# Patient Record
Sex: Female | Born: 1935 | Race: White | Hispanic: No | Smoking: Never smoker
Health system: Southern US, Community
[De-identification: ages and names within clinical notes are randomized; demographics above are authoritative.]

## PROBLEM LIST (undated history)

## (undated) DIAGNOSIS — E049 Nontoxic goiter, unspecified: Secondary | ICD-10-CM

## (undated) DIAGNOSIS — J189 Pneumonia, unspecified organism: Secondary | ICD-10-CM

## (undated) DIAGNOSIS — D126 Benign neoplasm of colon, unspecified: Secondary | ICD-10-CM

## (undated) DIAGNOSIS — E039 Hypothyroidism, unspecified: Secondary | ICD-10-CM

## (undated) DIAGNOSIS — I509 Heart failure, unspecified: Secondary | ICD-10-CM

## (undated) DIAGNOSIS — R5381 Other malaise: Secondary | ICD-10-CM

## (undated) DIAGNOSIS — Z7901 Long term (current) use of anticoagulants: Secondary | ICD-10-CM

## (undated) DIAGNOSIS — R609 Edema, unspecified: Secondary | ICD-10-CM

## (undated) DIAGNOSIS — I341 Nonrheumatic mitral (valve) prolapse: Secondary | ICD-10-CM

## (undated) DIAGNOSIS — H539 Unspecified visual disturbance: Secondary | ICD-10-CM

## (undated) DIAGNOSIS — I4891 Unspecified atrial fibrillation: Secondary | ICD-10-CM

## (undated) DIAGNOSIS — H1849 Other corneal degeneration: Secondary | ICD-10-CM

## (undated) DIAGNOSIS — I340 Nonrheumatic mitral (valve) insufficiency: Secondary | ICD-10-CM

## (undated) DIAGNOSIS — I10 Essential (primary) hypertension: Secondary | ICD-10-CM

## (undated) DIAGNOSIS — R5383 Other fatigue: Secondary | ICD-10-CM

## (undated) DIAGNOSIS — I499 Cardiac arrhythmia, unspecified: Secondary | ICD-10-CM

## (undated) DIAGNOSIS — K219 Gastro-esophageal reflux disease without esophagitis: Secondary | ICD-10-CM

## (undated) DIAGNOSIS — Z8639 Personal history of other endocrine, nutritional and metabolic disease: Secondary | ICD-10-CM

## (undated) HISTORY — DX: Unspecified visual disturbance: H53.9

## (undated) HISTORY — DX: Other malaise: R53.81

## (undated) HISTORY — DX: Nonrheumatic mitral (valve) insufficiency: I34.0

## (undated) HISTORY — DX: Gastro-esophageal reflux disease without esophagitis: K21.9

## (undated) HISTORY — DX: Nonrheumatic mitral (valve) prolapse: I34.1

## (undated) HISTORY — DX: Long term (current) use of anticoagulants: Z79.01

## (undated) HISTORY — DX: Pneumonia, unspecified organism: J18.9

## (undated) HISTORY — DX: Nontoxic goiter, unspecified: E04.9

## (undated) HISTORY — DX: Heart failure, unspecified: I50.9

## (undated) HISTORY — DX: Edema, unspecified: R60.9

## (undated) HISTORY — DX: Hypothyroidism, unspecified: E03.9

## (undated) HISTORY — DX: Benign neoplasm of colon, unspecified: D12.6

## (undated) HISTORY — DX: Cardiac arrhythmia, unspecified: I49.9

## (undated) HISTORY — DX: Personal history of other endocrine, nutritional and metabolic disease: Z86.39

## (undated) HISTORY — PX: CORNEAL TRANSPLANT: SHX108

## (undated) HISTORY — DX: Other fatigue: R53.83

## (undated) HISTORY — DX: Essential (primary) hypertension: I10

## (undated) HISTORY — DX: Other corneal degeneration: H18.49

## (undated) HISTORY — PX: SMALL INTESTINE SURGERY: SHX150

## (undated) HISTORY — DX: Unspecified atrial fibrillation: I48.91

## (undated) HISTORY — PX: RECTAL POLYPECTOMY: SHX2309

---
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
## 1960-08-21 HISTORY — PX: ABDOMINAL HYSTERECTOMY: SHX81

## 1986-02-19 DIAGNOSIS — H1849 Other corneal degeneration: Secondary | ICD-10-CM

## 1986-02-19 HISTORY — DX: Other corneal degeneration: H18.49

## 2000-09-10 ENCOUNTER — Encounter: Admission: RE | Admit: 2000-09-10 | Discharge: 2000-09-10 | Payer: Self-pay | Admitting: *Deleted

## 2000-09-26 DIAGNOSIS — I499 Cardiac arrhythmia, unspecified: Secondary | ICD-10-CM

## 2000-09-26 HISTORY — DX: Cardiac arrhythmia, unspecified: I49.9

## 2002-02-11 ENCOUNTER — Inpatient Hospital Stay (HOSPITAL_COMMUNITY): Admission: AD | Admit: 2002-02-11 | Discharge: 2002-02-18 | Payer: Self-pay | Admitting: Cardiology

## 2002-02-12 ENCOUNTER — Encounter: Payer: Self-pay | Admitting: Cardiology

## 2002-02-14 ENCOUNTER — Encounter: Payer: Self-pay | Admitting: Cardiology

## 2003-04-09 DIAGNOSIS — D126 Benign neoplasm of colon, unspecified: Secondary | ICD-10-CM

## 2003-04-09 HISTORY — DX: Benign neoplasm of colon, unspecified: D12.6

## 2003-06-01 ENCOUNTER — Encounter (INDEPENDENT_AMBULATORY_CARE_PROVIDER_SITE_OTHER): Payer: Self-pay | Admitting: Specialist

## 2003-06-01 ENCOUNTER — Ambulatory Visit (HOSPITAL_COMMUNITY): Admission: RE | Admit: 2003-06-01 | Discharge: 2003-06-01 | Payer: Self-pay | Admitting: General Surgery

## 2007-07-17 ENCOUNTER — Ambulatory Visit: Payer: Self-pay | Admitting: Vascular Surgery

## 2010-03-03 ENCOUNTER — Encounter: Admission: RE | Admit: 2010-03-03 | Discharge: 2010-03-03 | Payer: Self-pay | Admitting: Cardiology

## 2010-04-14 ENCOUNTER — Ambulatory Visit (HOSPITAL_COMMUNITY): Admission: RE | Admit: 2010-04-14 | Discharge: 2010-04-14 | Payer: Self-pay | Admitting: Cardiology

## 2010-04-14 ENCOUNTER — Ambulatory Visit: Payer: Self-pay | Admitting: Cardiology

## 2010-04-14 HISTORY — PX: CARDIOVERSION: SHX1299

## 2010-04-29 ENCOUNTER — Ambulatory Visit: Payer: Self-pay | Admitting: Cardiology

## 2010-05-02 ENCOUNTER — Ambulatory Visit: Payer: Self-pay | Admitting: Cardiology

## 2010-08-09 ENCOUNTER — Ambulatory Visit: Payer: Self-pay | Admitting: Cardiology

## 2010-09-20 ENCOUNTER — Encounter: Payer: Self-pay | Admitting: Cardiology

## 2010-09-20 DIAGNOSIS — Z8639 Personal history of other endocrine, nutritional and metabolic disease: Secondary | ICD-10-CM | POA: Insufficient documentation

## 2010-09-20 DIAGNOSIS — R609 Edema, unspecified: Secondary | ICD-10-CM | POA: Insufficient documentation

## 2010-09-20 DIAGNOSIS — I341 Nonrheumatic mitral (valve) prolapse: Secondary | ICD-10-CM | POA: Insufficient documentation

## 2010-09-20 DIAGNOSIS — I340 Nonrheumatic mitral (valve) insufficiency: Secondary | ICD-10-CM | POA: Insufficient documentation

## 2010-09-20 DIAGNOSIS — E039 Hypothyroidism, unspecified: Secondary | ICD-10-CM | POA: Insufficient documentation

## 2010-09-20 DIAGNOSIS — I4891 Unspecified atrial fibrillation: Secondary | ICD-10-CM | POA: Insufficient documentation

## 2010-09-20 DIAGNOSIS — I10 Essential (primary) hypertension: Secondary | ICD-10-CM | POA: Insufficient documentation

## 2010-09-20 DIAGNOSIS — K219 Gastro-esophageal reflux disease without esophagitis: Secondary | ICD-10-CM | POA: Insufficient documentation

## 2010-09-20 DIAGNOSIS — Z7901 Long term (current) use of anticoagulants: Secondary | ICD-10-CM | POA: Insufficient documentation

## 2010-09-24 ENCOUNTER — Encounter: Payer: Self-pay | Admitting: Cardiology

## 2010-10-12 DIAGNOSIS — H539 Unspecified visual disturbance: Secondary | ICD-10-CM

## 2010-10-12 HISTORY — DX: Unspecified visual disturbance: H53.9

## 2010-11-09 ENCOUNTER — Encounter: Payer: Self-pay | Admitting: Cardiology

## 2010-11-09 ENCOUNTER — Other Ambulatory Visit: Payer: Self-pay | Admitting: Cardiology

## 2010-11-09 ENCOUNTER — Ambulatory Visit (INDEPENDENT_AMBULATORY_CARE_PROVIDER_SITE_OTHER): Payer: Medicare Other | Admitting: Cardiology

## 2010-11-09 VITALS — BP 142/78 | HR 80 | Ht 61.0 in | Wt 101.4 lb

## 2010-11-09 DIAGNOSIS — I059 Rheumatic mitral valve disease, unspecified: Secondary | ICD-10-CM

## 2010-11-09 DIAGNOSIS — Z7901 Long term (current) use of anticoagulants: Secondary | ICD-10-CM

## 2010-11-09 DIAGNOSIS — I4891 Unspecified atrial fibrillation: Secondary | ICD-10-CM

## 2010-11-09 DIAGNOSIS — I509 Heart failure, unspecified: Secondary | ICD-10-CM

## 2010-11-09 DIAGNOSIS — Z79899 Other long term (current) drug therapy: Secondary | ICD-10-CM

## 2010-11-09 DIAGNOSIS — I340 Nonrheumatic mitral (valve) insufficiency: Secondary | ICD-10-CM

## 2010-11-09 LAB — BASIC METABOLIC PANEL
CO2: 27 mEq/L (ref 19–32)
Chloride: 102 mEq/L (ref 96–112)
Creat: 0.64 mg/dL (ref 0.40–1.20)
Glucose, Bld: 101 mg/dL — ABNORMAL HIGH (ref 70–99)
Sodium: 138 mEq/L (ref 135–145)

## 2010-11-09 NOTE — Assessment & Plan Note (Signed)
Rate is well controlled. Continue Coumadin for stroke prophlaxis.

## 2010-11-09 NOTE — Assessment & Plan Note (Addendum)
Continue Lasix and ACEi at current doses. Continue sodium restriction.

## 2010-11-09 NOTE — Assessment & Plan Note (Addendum)
INR 3.36 on 11/08/10. Dose adjusted. Repeat INR in 2 weeks.

## 2010-11-09 NOTE — Progress Notes (Signed)
HPI Mrs. Rhein is seen for follow up of Chf, atrial fibrillation, and mitral insufficiency. She states she is doing well without significant dyspnea, chest pain, dizziness, or palpitations. She does note increased edema if she eats any salt. She reports complete lab work last month with some elevation noted of her LFTs.  Allergies  Allergen Reactions  . Other     PRESERVATIVES  . Penicillins   . Sulfa Drugs Cross Reactors     Current Outpatient Prescriptions on File Prior to Visit  Medication Sig Dispense Refill  . amLODipine (NORVASC) 5 MG tablet Take 5 mg by mouth daily.        . benazepril (LOTENSIN) 40 MG tablet Take 40 mg by mouth 2 (two) times daily.        Marland Kitchen bismuth subsalicylate (PEPTO BISMOL) 262 MG/15ML suspension Take 15 mLs by mouth as needed.        . Calcium-Magnesium-Vitamin D (CALCIUM MAGNESIUM PO) Take 200 mg by mouth 3 (three) times daily.       . CYCLOSPORINE OP Apply 2 % to eye 3 (three) times daily.        Marland Kitchen estrogens, conjugated, (PREMARIN) 0.3 MG tablet Take 0.3 mg by mouth daily. Take daily for 21 days then do not take for 7 days.       . furosemide (LASIX) 40 MG tablet Take 40 mg by mouth daily.        Marland Kitchen levothyroxine (SYNTHROID, LEVOTHROID) 25 MCG tablet Take 25 mcg by mouth daily.        . potassium chloride SA (K-DUR,KLOR-CON) 20 MEQ tablet Take 20 mEq by mouth daily.        . PrednisoLONE Acetate (PRED FORTE OP) Apply 0.25 % to eye 3 (three) times daily.        . timolol (TIMOPTIC) 0.5 % ophthalmic solution 1 drop 2 (two) times daily.        Marland Kitchen warfarin (COUMADIN) 5 MG tablet Take 5 mg by mouth as directed.        . Cholecalciferol (VITAMIN D) 2000 UNIT tablet Take 2,000 Units by mouth daily.          Past Medical History  Diagnosis Date  . HTN (hypertension)   . MVP (mitral valve prolapse)   . Mitral insufficiency     SEVERE  . Chronic anticoagulation   . Edema   . Atrial fibrillation   . GERD (gastroesophageal reflux disease)   . Personal history of  goiter   . Hypothyroidism   . CHF (congestive heart failure)     Past Surgical History  Procedure Date  . Cardioversion   . Appendectomy   . Small intestine surgery     SBO 2000  . Abdominal hysterectomy   . Corneal transplant     MULTIPLE  . Rectal polypectomy     Family History  Problem Relation Age of Onset  . Stroke Sister   . Hip fracture Sister     History   Social History  . Marital Status: Widowed    Spouse Name: N/A    Number of Children: 1  . Years of Education: N/A   Occupational History  . 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 Smoking status: Never Smoker   . Smokeless tobacco: Not on file  . Alcohol Use: No  . Drug Use: No  . Sexually Active: Not on file   Other Topics Concern  . Not on file  Social History Narrative  . No narrative on file    ROS The patient denies any heat or cold intolerance.  No weight gain or weight loss.  The patient denies headaches or blurry vision.  There is no cough or sputum production.  The patient denies dizziness.  There is no hematuria or hematochezia.  The patient denies any muscle aches or arthritis.  The patient denies any rash.  The patient denies frequent falling or instability.  There is no history of depression or anxiety.  All other systems were reviewed and are negative.  PHYSICAL EXAM BP 142/78  Pulse 80  Ht 5\' 1"  (1.549 m)  Wt 101 lb 6.4 oz (45.995 kg)  BMI 19.16 kg/m2 The patient is alert and oriented x 3.  The mood and affect are normal.  The skin is warm and dry.  Color is normal.  The HEENT exam reveals that the sclera are nonicteric. Vision is poor especially in the left eye.   The mucous membranes are moist.  The carotids are 2+ without bruits. There is a radiated murmur from the heart.  There is no thyromegaly.  There is no JVD.  The lungs are clear.  The chest wall is non tender.  The heart exam reveals a regular rate with a normal S1 and S2.  There  is a loud 4/6  systolic murmur at the apex radiating across the precordium and into the back. The PMI is not displaced.   Abdominal exam reveals good bowel sounds.  There is no guarding or rebound.  There is no hepatosplenomegaly or tenderness.  There are no masses.  Exam of the legs reveal no clubbing or cyanosis.  There is 1+ edema.The distal pulses are intact.  There is mild stasis dermatitis.Cranial nerves II - XII are intact.  Motor and sensory functions are intact.  She walks with a cane.  ASSESSMENT AND PLAN

## 2010-11-09 NOTE — Assessment & Plan Note (Signed)
Contiunue medical Rx with diruetics and afterload reduction. Not a surgical candidate.

## 2010-11-11 ENCOUNTER — Telehealth: Payer: Self-pay | Admitting: *Deleted

## 2010-11-14 NOTE — Telephone Encounter (Signed)
Notified of lab results. 

## 2010-11-17 ENCOUNTER — Other Ambulatory Visit: Payer: Self-pay | Admitting: *Deleted

## 2010-11-17 DIAGNOSIS — I4819 Other persistent atrial fibrillation: Secondary | ICD-10-CM

## 2010-11-17 MED ORDER — WARFARIN SODIUM 5 MG PO TABS: ORAL_TABLET | ORAL | Status: DC

## 2010-11-17 NOTE — Telephone Encounter (Signed)
escribe medication per fax request  

## 2010-11-22 ENCOUNTER — Ambulatory Visit: Payer: Self-pay | Admitting: *Deleted

## 2010-12-07 ENCOUNTER — Ambulatory Visit (INDEPENDENT_AMBULATORY_CARE_PROVIDER_SITE_OTHER): Payer: Medicare Other | Admitting: *Deleted

## 2010-12-07 DIAGNOSIS — I4891 Unspecified atrial fibrillation: Secondary | ICD-10-CM

## 2010-12-20 ENCOUNTER — Ambulatory Visit: Payer: Self-pay | Admitting: *Deleted

## 2010-12-20 LAB — PROTIME-INR: INR: 1.3 — AB (ref 0.9–1.1)

## 2010-12-21 ENCOUNTER — Encounter: Payer: Self-pay | Admitting: Cardiology

## 2010-12-21 ENCOUNTER — Other Ambulatory Visit: Payer: Self-pay | Admitting: *Deleted

## 2010-12-21 ENCOUNTER — Other Ambulatory Visit: Payer: Self-pay | Admitting: Cardiology

## 2010-12-21 MED ORDER — FUROSEMIDE 20 MG PO TABS: 20.0000 mg | ORAL_TABLET | Freq: Every day | ORAL | Status: DC

## 2010-12-21 MED ORDER — FUROSEMIDE 40 MG PO TABS: 40.0000 mg | ORAL_TABLET | Freq: Every day | ORAL | Status: DC

## 2010-12-21 NOTE — Telephone Encounter (Signed)
Had a question about Lasix prescription. She takes 20mg  but the pharmacy told her that the dosage was for 40mg  (instructed to cut in half and take one half in the morning and the other half in the afternoon). Please call either Ms. Vanderlinde or the pharmacy. I have pulled the chart.

## 2010-12-21 NOTE — Telephone Encounter (Signed)
escribe medication per fax request  

## 2010-12-21 NOTE — Telephone Encounter (Signed)
Lasix refilled.

## 2011-01-03 ENCOUNTER — Ambulatory Visit (INDEPENDENT_AMBULATORY_CARE_PROVIDER_SITE_OTHER): Payer: Medicare Other | Admitting: Cardiovascular Disease

## 2011-01-03 ENCOUNTER — Encounter: Payer: Self-pay | Admitting: Cardiovascular Disease

## 2011-01-03 ENCOUNTER — Encounter: Payer: Self-pay | Admitting: Cardiology

## 2011-01-03 DIAGNOSIS — I4891 Unspecified atrial fibrillation: Secondary | ICD-10-CM

## 2011-01-03 NOTE — Procedures (Signed)
RENAL ARTERY DUPLEX EVALUATION   INDICATION:  Sudden onset of hypertension.   HISTORY:  Diabetes:  No.  Cardiac:  Mitral valve prolapse, congestive heart failure.  Hypertension:  Sudden onset of hypertension last week, dizziness,  blurred vision.  Smoking:  No.   RENAL ARTERY DUPLEX FINDINGS:  Aorta-Proximal:  65 cm/s  Aorta-Mid:  80 cm/s  Aorta-Distal:  78 cm/s  Celiac Artery Origin:  391/61 cm/s  SMA Origin:  226 cm/s                                    RIGHT               LEFT  Renal Artery Origin:             125/19 cm/s         150/25 cm/s  Renal Artery Proximal:           170/27 cm/s         135/20 cm/s  Renal Artery Mid:                145/22 cm/s         163/20 cm/s  Renal Artery Distal:             87/11 cm/s          169/29 cm/s  Hilar Acceleration Time (AT):    0.05/sec            0.04/sec  Renal-Aortic Ratio (RAR):        2.13                2.12  Kidney Size:                     10.0 cm X 3.8 cm    9.5 cm X 5.2 cm  End Diastolic Ratio (EDR):       0.03 - 0.07         0.21 - 0.29  Resistive Index (RI):            0.88                0.85   IMPRESSION:  1. Mild celiac artery stenosis.  2. Renal to aortic ratio suggests less than 60% renal artery stenosis      bilaterally.  3. End-diastolic ratio suggests parenchymal disease in the right      kidney.  4. Kidneys are normal with respect to shape and size bilaterally.   ___________________________________________  Larina Earthly, M.D.   MC/MEDQ  D:  07/17/2007  T:  07/17/2007  Job:  [id number]

## 2011-01-06 NOTE — Discharge Summary (Signed)
[place]. [facility]  Patient:    Rose Weber, Rose Weber Visit Number: [id number] MRN: [id number]          Service Type: MED Location: 2000 2012 01 Attending Physician:  [place], Peter Manning Dictated by:   Peter M. [place], M.D. Admit Date:  02/11/2002 Discharge Date: 02/18/2002                             Discharge Summary  HISTORY OF PRESENT ILLNESS:  The patient is an 77 year old white female resident of [facility] in [place] who presented with increased symptoms of shortness of breath, swelling, abdominal bloating and weakness. She was found to be in atrial fibrillation with rapid ventricular response. She also had congestive heart failure. She has a known history of chronic mitral valve prolapse with mitral insufficiency. For details of her past medical history, social history, family history and physical examination, please see admission history and physical.  LABORATORY DATA:  An ECG showed atrial fibrillation with rapid ventricular response with a rate of 130. A chest x-ray showed cardiomegaly with small bilateral pleural effusions and right lower lobe infiltrate.  White count 5800, hemoglobin 14.4, hematocrit 43.3, platelets 242,000. Coags were normal. Sodium 140, potassium 3.9, chloride 104, CO2 26, BUN 18, creatinine 0.7, glucose 124. Albumin 3.3, total protein 5.9, AST 49, ALT 90. All other chemistries were normal. Magnesium 2.1. TSH normal at 1.151. CK was 112 with 4.8 MB, troponin I 0.02, BNP level 428.  HOSPITAL COURSE:  The patient was admitted. She was anticoagulated with IV heparin. She was subsequently started on oral anticoagulation with Coumadin. She was placed on IV Cardizem for rate control but subsequently became bradycardic so her IV Cardizem was discontinued. She was started on oral amiodarone at 400 mg b.i.d. She was treated with IV Lasix with an excellent diuresis. She had resolution of her lower extremity edema and  abdominal bloating. Her shortness of breath improved. Her oxygen saturations remained good. Pulmonary functions were obtained as baseline and demonstrated normal diffusing capacity and early small airway obstruction.  An echocardiogram was obtained which demonstrated normal left ventricular size and function. There was mild aortic valve sclerosis without stenosis with mild aortic insufficiency. The mitral valve was moderately thickened with prolapse predominantly of the posterior leaflet. There was moderate to severe mitral insufficiency with the jet directed anteriorly, wrapping around the left atrium. The left atrial size was the upper limit of normal at 3.9 cm.  The patient did well. She subsequently converted to normal sinus rhythm. She had a sinus bradycardia necessitating stopping Cardizem and her beta blocker. Her amiodarone dose was reduced to 200 mg per day and she tolerated this well with continued sinus bradycardia throughout the remainder of her hospital stay. Her Coumadin was adjusted to an INR of greater than 2 and then her heparin was discontinued. At the time of discharge her protime was 19.1 with an INR of 1.7.  Followup chest x-ray showed some persistent bibasilar atelectasis and small effusions. Her weight steadily declined to a pre discharge weight of 96 pound. Her BMET was normal at the time of discharge with potassium of 3.9, creatinine 0.7. Liver function tests returned to normal with resolution of her congestion. The patient was stable for discharge on February 18, 2002.  DISCHARGE DIAGNOSES: 1. Congestive heart failure. 2. Atrial fibrillation, resolved. 3. Chronic mitral valve prolapse and insufficiency moderate to severe. 4. Hypothyroidism. 5. Previous partial small bowel resection.  DISCHARGE MEDICATIONS: 1. Premarin 0.3 mg q.d. 2. Synthroid 0.125 mg q.d. 3. Lotensin 20 mg q.d. 4. Calcium 1500 mg with D daily. 5. She is to continue following eye drops as  previously directed: Timoptic 5%,    Prednisone Forte 0.25% and cyclosporine 2%.  New medications include: 1. Coumadin 5 mg on Tuesday, then 2.5 mg q.d. 2. Amiodarone 200 mg q.d. 3. Potassium 20 mEq q.d. 4. Lasix 20 mg q.d.  DISCHARGE INSTRUCTIONS:  She is to resume a low-fat diet.  FOLLOWUP:  We will have her follow up for a protime on Thursday, February 20, 2002, and then schedule her for a follow up office visit with Dr. Swaziland in one to two weeks.  DISCHARGE STATUS:  Improved. Dictated by:   Peter M. [place], M.D. Attending Physician:  [place], Peter Manning DD:  02/18/02 TD:  02/19/02 Job: [id number]

## 2011-01-06 NOTE — Op Note (Signed)
NAME:  Rose Weber, CONWAY                          ACCOUNT NO.:  [id number]   MEDICAL RECORD NO.:  [id number]                   PATIENT TYPE:  AMB   LOCATION:  DAY                                  FACILITY:  [facility]   PHYSICIAN:  Timothy E. Earlene Plater, M.D.              DATE OF BIRTH:  1913/08/05   DATE OF PROCEDURE:  06/01/2003  DATE OF DISCHARGE:                                 OPERATIVE REPORT   PREOPERATIVE DIAGNOSIS:  Rectal polyp.   POSTOPERATIVE DIAGNOSIS:  Rectal polyp.   PROCEDURE:  Excision of rectal polyp.   SURGEON:  Timothy E. Earlene Plater, M.D.   ANESTHESIA:  Local and standby.   INDICATIONS:  Ms. Augspurger is 36 and in stable health, but has very significant  cardiovascular disease with mitral valve prolapse, corrected atrial  fibrillation, and hypertension.  She has bloody mucus discharge constantly  with protrusion in the rectum.  A colonoscopy otherwise negative.  After  careful explantation and exam in the office, she wishes to proceed with  excision of lone rectal polyp.  She has been carefully seen and evaluated by  Dr. Matthias Hughs and Dr. [place]iland and we all agree.  Her Coumadin has been  adjusted.  Her pro time is normal today.  She feels stable in regards to her  cardiac disease.   She was identified, the permit signed, and evaluated by anesthesia.  Preoperative antibiotics given.   DESCRIPTION OF PROCEDURE:  The patient was taken to the operating room.  IV  sedation given.  She was placed in the lithotomy position.  The perianal  area was inspected, prepped, and draped.  A large prolapsing rectal polyp  with an irritated bloody surface protruded through the anus.  The area of  the posterior and anterior anal rim were injected with Marcaine with  epinephrine and Wydase.  This provided sufficient local anesthesia.  The  polyp was grasped and drawn out of the anus.  Its stalk at the base was  suture ligated with a 2-0 chromic suture and the entire polyp was removed  and  submitted in formalin.  A small anoscope was then inserted.  The stalk  was hemostatic and there were no other lesions.  She had tolerated it well.  Gelfoam gauze and dry sterile dressing applied.  She was removed to the  recovery room in good condition.   Careful written and verbal instructions were given to the patient, including  Darvocet for pain and a return visit.  She tolerated it well.  There were no  medical complications.                                               Timothy E. Earlene Plater, M.D.    TED/MEDQ  D:  06/01/2003  T:  06/01/2003  Job:  [id number]   cc:   Bernette Redbird, M.D.  [address] [place]  [place], [place] [address]  Fax: [phone]   Peter M. [place], M.D.  [address]  [place], [place] [address]  Fax: [phone]

## 2011-01-06 NOTE — H&P (Signed)
[place]. [facility]  Patient:    Rose Weber, Rose Weber Visit Number: [id number] MRN: [id number]          Service Type: MED Location: 2000 2012 01 Attending Physician:  [place], Peter Manning Dictated by:   Peter M. [place], M.D. Admit Date:  02/11/2002                           History and Physical  DATE OF BIRTH: 05/06/1913  CHIEF COMPLAINT: Rose Weber is a pleasant 77 year old white female, who presents with symptoms of increasing shortness of breath, swelling, and abdominal bloating.  HISTORY OF PRESENT ILLNESS: She has also noted increased heart racing and pounding.  She has had significant increase in fatigue.  She denies any chest pain or cough.  She states she began feeling bad about one month ago after she had a bad head cold.  However, her other symptoms have really progressed significantly over the past week.  On evaluation today she was found to be in atrial fibrillation with rapid ventricular response, and has evidence of congestive heart failure.  She is admitted for further management.  Rose Weber has a known history of mitral valve prolapse with mitral insufficiency dating back at least 20 years.  This has previously been asymptomatic.  She was last evaluated with an echocardiogram in August 2002, which showed normal left ventricular size and function, with ejection fraction of 55-65%.  She had moderate aortic sclerosis without stenosis.  There was mild aortic insufficiency and severe mitral insufficiency with moderate mitral valve prolapse.  She has been on chronic SBE prophylaxis.  The patient was seen in August 2002 and was asymptomatic at that time, and we recommended continued medical therapy.  PAST MEDICAL HISTORY:  1. Mitral valve prolapse and chronic regurgitation.  2. She has a history of a goiter and has been on chronic suppression with     Synthroid.  3. She has had previous appendectomy and hysterectomy.  4. She had a partial  small bowel resection in November 2000 for obstruction.  5. She also has a history of corneal transplant x5, being followed at [facility].  ALLERGIES:  1. PENICILLIN.  2. SULFA.  3. MYCINS.  CURRENT MEDICATIONS:  1. Premarin 3 mg q.d.  2. Synthroid 0.125 mg q.d.  3. Lotensin 20 mg q.d.  4. Aspirin 1/2 of a regular strength aspirin q.d.  5. Eye drops used daily:     a. Timoptic 5%.     b. Pred Forte 0.25%.     c. Cyclosporine 2%.  SOCIAL HISTORY: The patient is retired.  She is a widow.  She has a son who lives in [place], [place].  She is currently residing at [facility].  She previously has lived in [place], [place] and then [place] here.  She denies tobacco or alcohol use.  FAMILY HISTORY: Father died at age 84 with complications of polio.  Mother died at age 78 of old age.  Two brothers are in good health.  One sister died at age 35 of a stroke.  REVIEW OF SYSTEMS: The patient complains of heaviness in her legs and achiness.  She complains of abdominal swelling and bloating, and increased lower extremity edema.  She has had no significant orthopnea but has been short of breath with any activity.  All other Review Of Systems are negative.  PHYSICAL EXAMINATION:  GENERAL: The patient is a pleasant white female in no distress.  VITAL SIGNS: Blood pressure is 150/100, pulse is 130 and irregular.  Weight is 112 pounds, which is up by 13 pounds.  Respirations are 22.  HEENT: PERRL.  Conjunctivae clear.  Oropharynx clear.  NECK: Supple without JVD, adenopathy, or bruits.  She has mild thyroid enlargement which is nontender.  LUNGS: Basilar crackles.  CARDIAC: Irregular rate and rhythm with a harsh grade 3/6 holosystolic murmur heard best at the apex.  She has no S3 but does have a prominent apical impulse.  ABDOMEN: Soft and nontender, without masses or hepatosplenomegaly.  It  is distended with probable ascites.  EXTREMITIES: Femoral and pedal pulses are 2+ and symmetric.  She has 2+ lower extremity edema.  NEUROLOGIC: Examination nonfocal.  LABORATORY DATA: ECG shows atrial fibrillation with rapid ventricular response, rate 130.  Chest x-ray shows cardiomegaly with small bilateral pleural effusions and a right lower lobe infiltrate.  IMPRESSION:  1. New onset atrial fibrillation with rapid ventricular response.  2. Congestive heart failure, precipitated by #1.  3. Chronic mitral valve prolapse, with severe mitral insufficiency.  4. History of mild aortic stenosis.  5. Hypertension.  6. History of goiter.  PLAN:  1. The patient will be admitted to telemetry.  2. Will initiate anticoagulation with heparin and then convert her to     Coumadin.  3. Will obtain rate control with IV Cardizem.  4. Consider antiarrhythmic drug therapy.  5. Will obtain all routine laboratory data.  6. Also reschedule her for echocardiogram and pulmonary function studies.Dictated by:   Peter M. [place], M.D. Attending Physician:  [place], Peter Manning DD:  02/12/02 TD:  02/13/02 Job: [id number]

## 2011-01-13 ENCOUNTER — Other Ambulatory Visit: Payer: Self-pay | Admitting: *Deleted

## 2011-01-13 DIAGNOSIS — I10 Essential (primary) hypertension: Secondary | ICD-10-CM

## 2011-01-19 ENCOUNTER — Ambulatory Visit: Payer: Self-pay | Admitting: *Deleted

## 2011-01-19 LAB — PROTIME-INR: INR: 2.1 — AB (ref 0.9–1.1)

## 2011-02-09 ENCOUNTER — Other Ambulatory Visit: Payer: Medicare Other | Admitting: *Deleted

## 2011-02-09 ENCOUNTER — Ambulatory Visit: Payer: Medicare Other | Admitting: Cardiology

## 2011-02-16 ENCOUNTER — Ambulatory Visit (INDEPENDENT_AMBULATORY_CARE_PROVIDER_SITE_OTHER): Payer: Medicare Other | Admitting: Cardiology

## 2011-02-16 ENCOUNTER — Encounter: Payer: Self-pay | Admitting: Cardiology

## 2011-02-16 ENCOUNTER — Other Ambulatory Visit (INDEPENDENT_AMBULATORY_CARE_PROVIDER_SITE_OTHER): Payer: Medicare Other | Admitting: *Deleted

## 2011-02-16 ENCOUNTER — Ambulatory Visit (INDEPENDENT_AMBULATORY_CARE_PROVIDER_SITE_OTHER): Payer: Medicare Other | Admitting: *Deleted

## 2011-02-16 VITALS — BP 172/94 | HR 94 | Ht 61.5 in | Wt 98.0 lb

## 2011-02-16 DIAGNOSIS — I341 Nonrheumatic mitral (valve) prolapse: Secondary | ICD-10-CM

## 2011-02-16 DIAGNOSIS — I059 Rheumatic mitral valve disease, unspecified: Secondary | ICD-10-CM

## 2011-02-16 DIAGNOSIS — I10 Essential (primary) hypertension: Secondary | ICD-10-CM

## 2011-02-16 DIAGNOSIS — I4891 Unspecified atrial fibrillation: Secondary | ICD-10-CM

## 2011-02-16 DIAGNOSIS — I509 Heart failure, unspecified: Secondary | ICD-10-CM

## 2011-02-16 LAB — BASIC METABOLIC PANEL
CO2: 28 mEq/L (ref 19–32)
Calcium: 9.2 mg/dL (ref 8.4–10.5)
Sodium: 138 mEq/L (ref 135–145)

## 2011-02-16 NOTE — Patient Instructions (Signed)
Continue your current medications.  Watch your salt intake.  I will see you again in 3 months.

## 2011-02-17 NOTE — Assessment & Plan Note (Addendum)
She is euvolemic by exam. Her weight is stable. We will continue with her current medical therapy. Basic metabolic panel today is normal.

## 2011-02-17 NOTE — Assessment & Plan Note (Signed)
Chest chronic severe mitral insufficiency. She is a poor candidate for interventional treatment. We'll continue her medical therapy.

## 2011-02-17 NOTE — Progress Notes (Signed)
HPI Rose Weber is seen for follow up of Chf, atrial fibrillation, and mitral insufficiency. She states she is doing well without significant dyspnea, chest pain, dizziness, or palpitations. She does note increased edema if she eats any salt. Since her last visit she did undergo another corneal transplant in April. She has already noted a significant improvement in her vision. She reports her blood pressure readings at home have been good.  Allergies  Allergen Reactions  . Alphagan (Brimonidine Tartrate)   . Bystolic (Nebivolol Hcl)   . Diovan (Valsartan)   . Other     PRESERVATIVES  . Penicillins   . Polytrim   . Sulfa Drugs Cross Reactors   . Tekturna (Aliskiren Fumarate)   . Travatan Z     Current Outpatient Prescriptions on File Prior to Visit  Medication Sig Dispense Refill  . amLODipine (NORVASC) 5 MG tablet Take 5 mg by mouth daily.        . benazepril (LOTENSIN) 40 MG tablet Take 40 mg by mouth 2 (two) times daily.        Marland Kitchen bismuth subsalicylate (PEPTO BISMOL) 262 MG/15ML suspension Take 15 mLs by mouth as needed.        . Calcium-Magnesium-Vitamin D (CALCIUM MAGNESIUM PO) Take 200 mg by mouth 3 (three) times daily.       . Cholecalciferol (VITAMIN D) 2000 UNIT tablet Take 2,000 Units by mouth daily.        . CYCLOSPORINE OP Apply 2 % to eye 3 (three) times daily.        Marland Kitchen estrogens, conjugated, (PREMARIN) 0.3 MG tablet Take 0.3 mg by mouth daily. Take daily for 21 days then do not take for 7 days.       . furosemide (LASIX) 20 MG tablet Take 1 tablet (20 mg total) by mouth daily.  30 tablet  5  . levothyroxine (SYNTHROID, LEVOTHROID) 25 MCG tablet Take 25 mcg by mouth daily.        . potassium chloride SA (K-DUR,KLOR-CON) 20 MEQ tablet Take 20 mEq by mouth daily.        . PrednisoLONE Acetate (PRED FORTE OP) Apply 0.25 % to eye 3 (three) times daily.        . timolol (TIMOPTIC) 0.5 % ophthalmic solution 1 drop 2 (two) times daily.        Marland Kitchen warfarin (COUMADIN) 5 MG tablet Take 5  mg x 6 days and 2.5 mg x 1 day (tues);or as directed by physician  45 tablet  5    Past Medical History  Diagnosis Date  . HTN (hypertension)   . MVP (mitral valve prolapse)   . Mitral insufficiency     SEVERE  . Chronic anticoagulation   . Edema   . Atrial fibrillation   . GERD (gastroesophageal reflux disease)   . Personal history of goiter   . Hypothyroidism   . CHF (congestive heart failure)     Past Surgical History  Procedure Date  . Cardioversion 04/14/10  . Appendectomy   . Small intestine surgery     SBO 2000  . Abdominal hysterectomy   . Corneal transplant     MULTIPLE  . Rectal polypectomy     Family History  Problem Relation Age of Onset  . Stroke Sister   . Hip fracture Sister     History   Social History  . Marital Status: Widowed    Spouse Name: N/A    Number of Children: 1  . Years of  Education: N/A   Occupational History  . 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 Smoking status: Never Smoker   . Smokeless tobacco: Not on file  . Alcohol Use: No  . Drug Use: No  . Sexually Active: Not on file   Other Topics Concern  . Not on file   Social History Narrative  . No narrative on file    ROS The patient denies any heat or cold intolerance.  No weight gain or weight loss.  The patient denies headaches or blurry vision.  There is no cough or sputum production.  The patient denies dizziness.  There is no hematuria or hematochezia.  The patient denies any muscle aches or arthritis.  The patient denies any rash.  The patient denies frequent falling or instability.  There is no history of depression or anxiety.  All other systems were reviewed and are negative.  PHYSICAL EXAM BP 172/94  Pulse 94  Ht 5' 1.5" (1.562 m)  Wt 98 lb (44.453 kg)  BMI 18.22 kg/m2 The patient is alert and oriented x 3.  The mood and affect are normal.  The skin is warm and dry.  Color is normal.  The HEENT exam reveals that the sclera are  nonicteric. Vision is poor especially in the left eye.   The mucous membranes are moist.  The carotids are 2+ without bruits. There is a radiated murmur from the heart.  There is no thyromegaly.  There is no JVD.  The lungs are clear.  The chest wall is non tender.  The heart exam reveals a regular rate with a normal S1 and S2.  There  is a loud 4/6 systolic murmur at the apex radiating across the precordium and into the back. The PMI is not displaced.   Abdominal exam reveals good bowel sounds.  There is no guarding or rebound.  There is no hepatosplenomegaly or tenderness.  There are no masses.  Exam of the legs reveal no clubbing or cyanosis.  There is 1+ edema.The distal pulses are intact.  There is mild stasis dermatitis.Cranial nerves II - XII are intact.  Motor and sensory functions are intact.  She walks with a cane.  ASSESSMENT AND PLAN

## 2011-02-17 NOTE — Assessment & Plan Note (Signed)
Her rate is controlled and she is therapeutic with her anticoagulation. Continue with rate control and anticoagulation.

## 2011-02-21 ENCOUNTER — Other Ambulatory Visit: Payer: Self-pay | Admitting: Cardiology

## 2011-02-21 ENCOUNTER — Telehealth: Payer: Self-pay | Admitting: *Deleted

## 2011-02-21 MED ORDER — POTASSIUM CHLORIDE CRYS ER 20 MEQ PO TBCR: 20.0000 meq | EXTENDED_RELEASE_TABLET | Freq: Every day | ORAL | Status: DC

## 2011-02-21 NOTE — Telephone Encounter (Signed)
Med refill

## 2011-02-21 NOTE — Telephone Encounter (Signed)
Notified of lab results. Will coumadin checked end of July.

## 2011-02-21 NOTE — Telephone Encounter (Signed)
Message copied by Lorayne Bender on Tue Feb 21, 2011 11:09 AM ------      Message from: [place], PETER M      Created: Fri Feb 17, 2011 12:28 PM       Bmet is normal. Inr is therapeutic.

## 2011-03-09 ENCOUNTER — Other Ambulatory Visit: Payer: Self-pay | Admitting: *Deleted

## 2011-03-09 MED ORDER — BENAZEPRIL HCL 40 MG PO TABS: 40.0000 mg | ORAL_TABLET | Freq: Two times a day (BID) | ORAL | Status: DC

## 2011-03-09 NOTE — Telephone Encounter (Signed)
escribe medication per fax request  

## 2011-03-16 ENCOUNTER — Ambulatory Visit (INDEPENDENT_AMBULATORY_CARE_PROVIDER_SITE_OTHER): Payer: Self-pay | Admitting: *Deleted

## 2011-03-16 DIAGNOSIS — I4891 Unspecified atrial fibrillation: Secondary | ICD-10-CM

## 2011-03-17 ENCOUNTER — Encounter: Payer: Self-pay | Admitting: Cardiology

## 2011-03-29 ENCOUNTER — Other Ambulatory Visit: Payer: Self-pay | Admitting: *Deleted

## 2011-03-29 MED ORDER — WARFARIN SODIUM 5 MG PO TABS: ORAL_TABLET | ORAL | Status: DC

## 2011-04-20 ENCOUNTER — Telehealth: Payer: Self-pay | Admitting: Cardiology

## 2011-04-20 ENCOUNTER — Ambulatory Visit (INDEPENDENT_AMBULATORY_CARE_PROVIDER_SITE_OTHER): Payer: Self-pay | Admitting: Cardiovascular Disease

## 2011-04-20 DIAGNOSIS — R0989 Other specified symptoms and signs involving the circulatory and respiratory systems: Secondary | ICD-10-CM

## 2011-04-20 NOTE — Telephone Encounter (Signed)
Calling to repot an INR result.

## 2011-04-21 NOTE — Telephone Encounter (Signed)
See anticoag note from 04/20/11, INR results addressed.

## 2011-05-08 ENCOUNTER — Encounter: Payer: Self-pay | Admitting: Cardiology

## 2011-05-17 ENCOUNTER — Ambulatory Visit (INDEPENDENT_AMBULATORY_CARE_PROVIDER_SITE_OTHER): Payer: Medicare Other | Admitting: Cardiology

## 2011-05-17 ENCOUNTER — Encounter: Payer: Self-pay | Admitting: Cardiology

## 2011-05-17 ENCOUNTER — Ambulatory Visit: Payer: Self-pay | Admitting: *Deleted

## 2011-05-17 VITALS — BP 144/90 | HR 92 | Wt 101.0 lb

## 2011-05-17 DIAGNOSIS — I4891 Unspecified atrial fibrillation: Secondary | ICD-10-CM

## 2011-05-17 DIAGNOSIS — I059 Rheumatic mitral valve disease, unspecified: Secondary | ICD-10-CM

## 2011-05-17 DIAGNOSIS — E7800 Pure hypercholesterolemia, unspecified: Secondary | ICD-10-CM

## 2011-05-17 DIAGNOSIS — I509 Heart failure, unspecified: Secondary | ICD-10-CM

## 2011-05-17 DIAGNOSIS — I341 Nonrheumatic mitral (valve) prolapse: Secondary | ICD-10-CM

## 2011-05-17 LAB — POCT INR: INR: 2.6

## 2011-05-17 NOTE — Patient Instructions (Signed)
Continue your current medications.  I will see you again in 3 months with fasting lab work.

## 2011-05-18 NOTE — Assessment & Plan Note (Signed)
She has chronic mitral valve prolapse with severe mitral insufficiency. She is not an ideal candidate for surgery or for a clip device given her advanced age and comorbidities.

## 2011-05-18 NOTE — Progress Notes (Signed)
HPI Rose Weber is seen for follow up of Chf, atrial fibrillation, and mitral insufficiency. She states she is doing well without significant dyspnea, chest pain, dizziness, or palpitations. She does note increased edema if she eats any salt. She reports her blood pressure readings at home have been good. She does note that if she exercises she has some more pounding in her chest and she was to no foods okay to do an exercise program with a NU step machine.  Allergies  Allergen Reactions  . Alphagan (Brimonidine Tartrate)   . Bystolic (Nebivolol Hcl)   . Diovan (Valsartan)   . Other     PRESERVATIVES  . Penicillins   . Polytrim   . Sulfa Drugs Cross Reactors   . Tekturna (Aliskiren Fumarate)   . Travatan Z     Current Outpatient Prescriptions on File Prior to Visit  Medication Sig Dispense Refill  . amLODipine (NORVASC) 5 MG tablet Take 5 mg by mouth daily.        . benazepril (LOTENSIN) 40 MG tablet Take 1 tablet (40 mg total) by mouth 2 (two) times daily.  60 tablet  5  . bismuth subsalicylate (PEPTO BISMOL) 262 MG/15ML suspension Take 15 mLs by mouth as needed.        . Calcium-Magnesium-Vitamin D (CALCIUM MAGNESIUM PO) Take 200 mg by mouth 3 (three) times daily.       . carboxymethylcellulose (REFRESH PLUS) 0.5 % SOLN Apply 1 drop to eye 3 (three) times daily as needed.        . Cholecalciferol (VITAMIN D) 2000 UNIT tablet Take 2,000 Units by mouth daily.        . CYCLOSPORINE OP Apply 2 % to eye 3 (three) times daily.        Marland Kitchen erythromycin ophthalmic ointment Place 1 application into the left eye at bedtime. PRN      . estrogens, conjugated, (PREMARIN) 0.3 MG tablet Take 0.3 mg by mouth daily. Take daily for 21 days then do not take for 7 days.       . furosemide (LASIX) 20 MG tablet Take 1 tablet (20 mg total) by mouth daily.  30 tablet  5  . levothyroxine (SYNTHROID, LEVOTHROID) 25 MCG tablet Take 25 mcg by mouth daily.        . potassium chloride SA (K-DUR,KLOR-CON) 20 MEQ tablet  Take 1 tablet (20 mEq total) by mouth daily.  30 tablet  5  . PrednisoLONE Acetate (PRED FORTE OP) Apply 0.25 % to eye 3 (three) times daily.        . timolol (TIMOPTIC) 0.5 % ophthalmic solution 1 drop 2 (two) times daily.        Marland Kitchen warfarin (COUMADIN) 5 MG tablet Take 5 mg x 6 days and 2.5 mg x 1 day (tues);or as directed by physician  45 tablet  5    Past Medical History  Diagnosis Date  . HTN (hypertension)   . MVP (mitral valve prolapse)   . Mitral insufficiency     SEVERE  . Chronic anticoagulation   . Edema   . GERD (gastroesophageal reflux disease)   . Personal history of goiter   . Hypothyroidism   . CHF (congestive heart failure)   . Mitral valve prolapse     With severe mitral insufficiency  . Atrial fibrillation   . Goiter     History of goiter    Past Surgical History  Procedure Date  . Cardioversion 04/14/10  . Appendectomy   .  Small intestine surgery     SBO 2000  . Abdominal hysterectomy   . Corneal transplant     MULTIPLE  . Rectal polypectomy     Family History  Problem Relation Age of Onset  . Stroke Sister   . Hip fracture Sister     History   Social History  . Marital Status: Widowed    Spouse Name: N/A    Number of Children: 1  . Years of Education: N/A   Occupational History  . 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 Smoking status: Never Smoker   . Smokeless tobacco: Not on file  . Alcohol Use: No  . Drug Use: No  . Sexually Active: Not on file   Other Topics Concern  . Not on file   Social History Narrative  . No narrative on file    ROS The patient denies any heat or cold intolerance.  No weight gain or weight loss.  The patient denies headaches or blurry vision.  There is no cough or sputum production.  The patient denies dizziness.  There is no hematuria or hematochezia.  The patient denies any muscle aches or arthritis.  The patient denies any rash.  The patient denies frequent falling or  instability.  There is no history of depression or anxiety.  All other systems were reviewed and are negative.  PHYSICAL EXAM BP 144/90  Pulse 92  Wt 101 lb (45.813 kg) The patient is alert and oriented x 3.  The mood and affect are normal.  The skin is warm and dry.  Color is normal.  The HEENT exam reveals that the sclera are nonicteric. Vision is poor especially in the left eye.   The mucous membranes are moist.  The carotids are 2+ without bruits. There is a radiated murmur from the heart.  There is no thyromegaly.  There is no JVD.  The lungs are clear.  The chest wall is non tender.  The heart exam reveals a regular rate with a normal S1 and S2.  There  is a loud 4/6 systolic murmur at the apex radiating across the precordium and into the back. The PMI is not displaced.   Abdominal exam reveals good bowel sounds.  There is no guarding or rebound.  There is no hepatosplenomegaly or tenderness.  There are no masses.  Exam of the legs reveal no clubbing or cyanosis.  There is 1+ edema.The distal pulses are intact.  There is mild stasis dermatitis.Cranial nerves II - XII are intact.  Motor and sensory functions are intact.  She walks with a cane.  ASSESSMENT AND PLAN

## 2011-05-18 NOTE — Assessment & Plan Note (Signed)
Her rate is controlled and she is anticoagulated. She is asymptomatic.

## 2011-05-18 NOTE — Assessment & Plan Note (Signed)
She is well compensated on exam today. We will continue with her current diuretic therapy. I have reinforced the need for sodium restriction in her diet.

## 2011-06-07 ENCOUNTER — Other Ambulatory Visit: Payer: Self-pay | Admitting: Cardiology

## 2011-06-07 MED ORDER — AMLODIPINE BESYLATE 5 MG PO TABS: 5.0000 mg | ORAL_TABLET | Freq: Every day | ORAL | Status: DC

## 2011-06-13 ENCOUNTER — Ambulatory Visit (INDEPENDENT_AMBULATORY_CARE_PROVIDER_SITE_OTHER): Payer: Self-pay | Admitting: Internal Medicine

## 2011-06-13 DIAGNOSIS — R0989 Other specified symptoms and signs involving the circulatory and respiratory systems: Secondary | ICD-10-CM

## 2011-06-13 DIAGNOSIS — I4891 Unspecified atrial fibrillation: Secondary | ICD-10-CM

## 2011-06-29 ENCOUNTER — Ambulatory Visit (INDEPENDENT_AMBULATORY_CARE_PROVIDER_SITE_OTHER): Payer: Self-pay | Admitting: Cardiovascular Disease

## 2011-06-29 ENCOUNTER — Other Ambulatory Visit: Payer: Self-pay | Admitting: Cardiology

## 2011-06-29 ENCOUNTER — Encounter: Payer: Self-pay | Admitting: Cardiology

## 2011-06-29 DIAGNOSIS — I4891 Unspecified atrial fibrillation: Secondary | ICD-10-CM

## 2011-06-29 DIAGNOSIS — R0989 Other specified symptoms and signs involving the circulatory and respiratory systems: Secondary | ICD-10-CM

## 2011-06-29 LAB — PROTIME-INR

## 2011-07-11 ENCOUNTER — Ambulatory Visit (INDEPENDENT_AMBULATORY_CARE_PROVIDER_SITE_OTHER): Payer: Self-pay | Admitting: Internal Medicine

## 2011-07-11 DIAGNOSIS — I4891 Unspecified atrial fibrillation: Secondary | ICD-10-CM

## 2011-07-11 DIAGNOSIS — R0989 Other specified symptoms and signs involving the circulatory and respiratory systems: Secondary | ICD-10-CM

## 2011-07-11 LAB — PROTIME-INR: INR: 2.7 — AB (ref 0.9–1.1)

## 2011-08-10 ENCOUNTER — Ambulatory Visit (INDEPENDENT_AMBULATORY_CARE_PROVIDER_SITE_OTHER): Payer: Medicare Other | Admitting: Cardiology

## 2011-08-10 ENCOUNTER — Ambulatory Visit (INDEPENDENT_AMBULATORY_CARE_PROVIDER_SITE_OTHER): Payer: Medicare Other | Admitting: *Deleted

## 2011-08-10 ENCOUNTER — Other Ambulatory Visit (INDEPENDENT_AMBULATORY_CARE_PROVIDER_SITE_OTHER): Payer: Medicare Other | Admitting: *Deleted

## 2011-08-10 ENCOUNTER — Encounter: Payer: Self-pay | Admitting: Cardiology

## 2011-08-10 VITALS — BP 140/82 | HR 98 | Ht 61.0 in | Wt 106.0 lb

## 2011-08-10 DIAGNOSIS — I340 Nonrheumatic mitral (valve) insufficiency: Secondary | ICD-10-CM

## 2011-08-10 DIAGNOSIS — E7800 Pure hypercholesterolemia, unspecified: Secondary | ICD-10-CM

## 2011-08-10 DIAGNOSIS — I4891 Unspecified atrial fibrillation: Secondary | ICD-10-CM

## 2011-08-10 DIAGNOSIS — I059 Rheumatic mitral valve disease, unspecified: Secondary | ICD-10-CM

## 2011-08-10 DIAGNOSIS — I341 Nonrheumatic mitral (valve) prolapse: Secondary | ICD-10-CM

## 2011-08-10 DIAGNOSIS — I509 Heart failure, unspecified: Secondary | ICD-10-CM

## 2011-08-10 LAB — BASIC METABOLIC PANEL
BUN: 22 mg/dL (ref 6–23)
CO2: 27 mEq/L (ref 19–32)
Calcium: 9.2 mg/dL (ref 8.4–10.5)
Glucose, Bld: 102 mg/dL — ABNORMAL HIGH (ref 70–99)
Potassium: 3.9 mEq/L (ref 3.5–5.1)
Sodium: 139 mEq/L (ref 135–145)

## 2011-08-10 LAB — HEPATIC FUNCTION PANEL
AST: 37 U/L (ref 0–37)
Albumin: 3.8 g/dL (ref 3.5–5.2)
Alkaline Phosphatase: 90 U/L (ref 39–117)
Total Protein: 7 g/dL (ref 6.0–8.3)

## 2011-08-10 LAB — CBC WITH DIFFERENTIAL/PLATELET
Basophils Relative: 0.5 % (ref 0.0–3.0)
Eosinophils Relative: 2.2 % (ref 0.0–5.0)
HCT: 40.5 % (ref 36.0–46.0)
Hemoglobin: 13.9 g/dL (ref 12.0–15.0)
Lymphs Abs: 1.3 10*3/uL (ref 0.7–4.0)
MCV: 95.5 fl (ref 78.0–100.0)
Monocytes Absolute: 0.4 10*3/uL (ref 0.1–1.0)
Neutro Abs: 2.3 10*3/uL (ref 1.4–7.7)
RBC: 4.24 Mil/uL (ref 3.87–5.11)
WBC: 4.1 10*3/uL — ABNORMAL LOW (ref 4.5–10.5)

## 2011-08-10 NOTE — Patient Instructions (Signed)
If you gain more than 2 lbs in a day or if you gain 5 lbs in a week then you should increase your lasix to 40 mg. Then resume 20 mg daily when your weight is down.  Otherwise continue your medications.  Avoid salt.   We will call with the results of your lab work.  I will see you in 3 months.  Merry Christmas!

## 2011-08-11 NOTE — Progress Notes (Signed)
HPI Rose Weber is seen for follow up of Chf, atrial fibrillation, and mitral insufficiency. She states she is doing well without significant dyspnea, chest pain, dizziness, or palpitations. She has noticed some increased tightness in her abdomen which is usually where she first notices swelling. Her weight has increased by 5 pounds.  Allergies  Allergen Reactions  . Alphagan (Brimonidine Tartrate)   . Bystolic (Nebivolol Hcl)   . Diovan (Valsartan)   . Other     PRESERVATIVES  . Penicillins   . Polytrim   . Sulfa Drugs Cross Reactors   . Tekturna (Aliskiren Fumarate)   . Travatan Z     Current Outpatient Prescriptions on File Prior to Visit  Medication Sig Dispense Refill  . amLODipine (NORVASC) 5 MG tablet Take 1 tablet (5 mg total) by mouth daily.  60 tablet  6  . Ascorbic Acid (VITAMIN C PO) Take by mouth.        . benazepril (LOTENSIN) 40 MG tablet Take 1 tablet (40 mg total) by mouth 2 (two) times daily.  60 tablet  5  . bismuth subsalicylate (PEPTO BISMOL) 262 MG/15ML suspension Take 15 mLs by mouth as needed.        . Calcium-Magnesium-Vitamin D (CALCIUM MAGNESIUM PO) Take 200 mg by mouth 3 (three) times daily.       . carboxymethylcellulose (REFRESH PLUS) 0.5 % SOLN Apply 1 drop to eye 3 (three) times daily as needed.        . Cholecalciferol (VITAMIN D) 2000 UNIT tablet Take 2,000 Units by mouth daily.        . CYCLOSPORINE OP Apply 2 % to eye 3 (three) times daily.        Marland Kitchen erythromycin ophthalmic ointment Place 1 application into the left eye at bedtime. PRN      . estrogens, conjugated, (PREMARIN) 0.3 MG tablet Take 0.3 mg by mouth daily. Take daily for 21 days then do not take for 7 days.       Marland Kitchen LASIX 20 MG tablet TAKE 1 TABLET ONCE DAILY.  30 each  5  . levothyroxine (SYNTHROID, LEVOTHROID) 25 MCG tablet Take 25 mcg by mouth daily.        . potassium chloride SA (K-DUR,KLOR-CON) 20 MEQ tablet Take 1 tablet (20 mEq total) by mouth daily.  30 tablet  5  . PrednisoLONE  Acetate (PRED FORTE OP) Apply 0.25 % to eye 3 (three) times daily.        . timolol (TIMOPTIC) 0.5 % ophthalmic solution 1 drop 2 (two) times daily.        Marland Kitchen VITAMIN E PO Take by mouth.        . warfarin (COUMADIN) 5 MG tablet Take 5 mg x 6 days and 2.5 mg x 1 day (tues);or as directed by physician  45 tablet  5    Past Medical History  Diagnosis Date  . HTN (hypertension)   . MVP (mitral valve prolapse)   . Mitral insufficiency     SEVERE  . Chronic anticoagulation   . Edema   . GERD (gastroesophageal reflux disease)   . Personal history of goiter   . Hypothyroidism   . CHF (congestive heart failure)   . Mitral valve prolapse     With severe mitral insufficiency  . Atrial fibrillation   . Goiter     History of goiter    Past Surgical History  Procedure Date  . Cardioversion 04/14/10  . Appendectomy   . Small  intestine surgery     SBO 2000  . Abdominal hysterectomy   . Corneal transplant     MULTIPLE  . Rectal polypectomy     Family History  Problem Relation Age of Onset  . Stroke Sister   . Hip fracture Sister     History   Social History  . Marital Status: Widowed    Spouse Name: N/A    Number of Children: 1  . Years of Education: N/A   Occupational History  . 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 Smoking status: Never Smoker   . Smokeless tobacco: Not on file  . Alcohol Use: No  . Drug Use: No  . Sexually Active: Not on file   Other Topics Concern  . Not on file   Social History Narrative  . No narrative on file    ROS  As noted in history of present illness.  All other systems were reviewed and are negative.  PHYSICAL EXAM BP 140/82  Pulse 98  Ht 5\' 1"  (1.549 m)  Wt 106 lb (48.081 kg)  BMI 20.03 kg/m2 The patient is alert and oriented x 3.  The mood and affect are normal.  The skin is warm and dry.  Color is normal.  The HEENT exam reveals that the sclera are nonicteric. Vision is poor especially in the left  eye.   The mucous membranes are moist.  The carotids are 2+ without bruits. There is a radiated murmur from the heart.  There is no thyromegaly.  There is no JVD.  The lungs are clear.  The chest wall is non tender.  The heart exam reveals a regular rate with a normal S1 and S2.  There  is a loud 4/6 systolic murmur at the apex radiating across the precordium and into the back. The PMI is not displaced.   Abdominal exam reveals good bowel sounds.  There is no guarding or rebound.  There is no hepatosplenomegaly or tenderness.  There are no masses.  Exam of the legs reveal no clubbing or cyanosis.  There is 1+ edema.The distal pulses are intact.  There is mild stasis dermatitis.Cranial nerves II - XII are intact.  Motor and sensory functions are intact.  She walks with a cane.  Laboratory data: ECG today demonstrates atrial fibrillation with a rate of 98 beats per minute. There is left axis deviation and nonspecific ST abnormality.  ASSESSMENT AND PLAN

## 2011-08-11 NOTE — Assessment & Plan Note (Signed)
Her weight has increased by 5 pounds. I gave her parameters for increasing her Lasix as needed for weight gain or increased swelling. She will continue with her sodium restriction. We will followup again in 3 months.

## 2011-08-11 NOTE — Assessment & Plan Note (Signed)
Rate is well controlled and she is anticoagulated.

## 2011-08-11 NOTE — Assessment & Plan Note (Signed)
Murmur of severe MR is again noted. She is not a candidate for valve repair at her advanced age.

## 2011-08-14 ENCOUNTER — Other Ambulatory Visit: Payer: Self-pay | Admitting: Cardiology

## 2011-08-21 ENCOUNTER — Telehealth: Payer: Self-pay | Admitting: *Deleted

## 2011-08-21 MED ORDER — FUROSEMIDE 20 MG PO TABS: ORAL_TABLET | ORAL | Status: DC

## 2011-08-21 NOTE — Telephone Encounter (Signed)
Spoke w/pharmacist to clarify dosage of Lasix. Per Dr. [place] advised to take Lasix 20 mg daily and may take extra dose if has wt gain or increased swelling. Gave # 60 with 5 refills.

## 2011-08-30 IMAGING — CR DG CHEST 2V
2 series · 2 of 2 positions shown · non-contrast
Comparison: None.

CLINICAL DATA: Hypertension, pre cardioversion

CHEST - 2 VIEW

[view not recorded (1 of 2)]
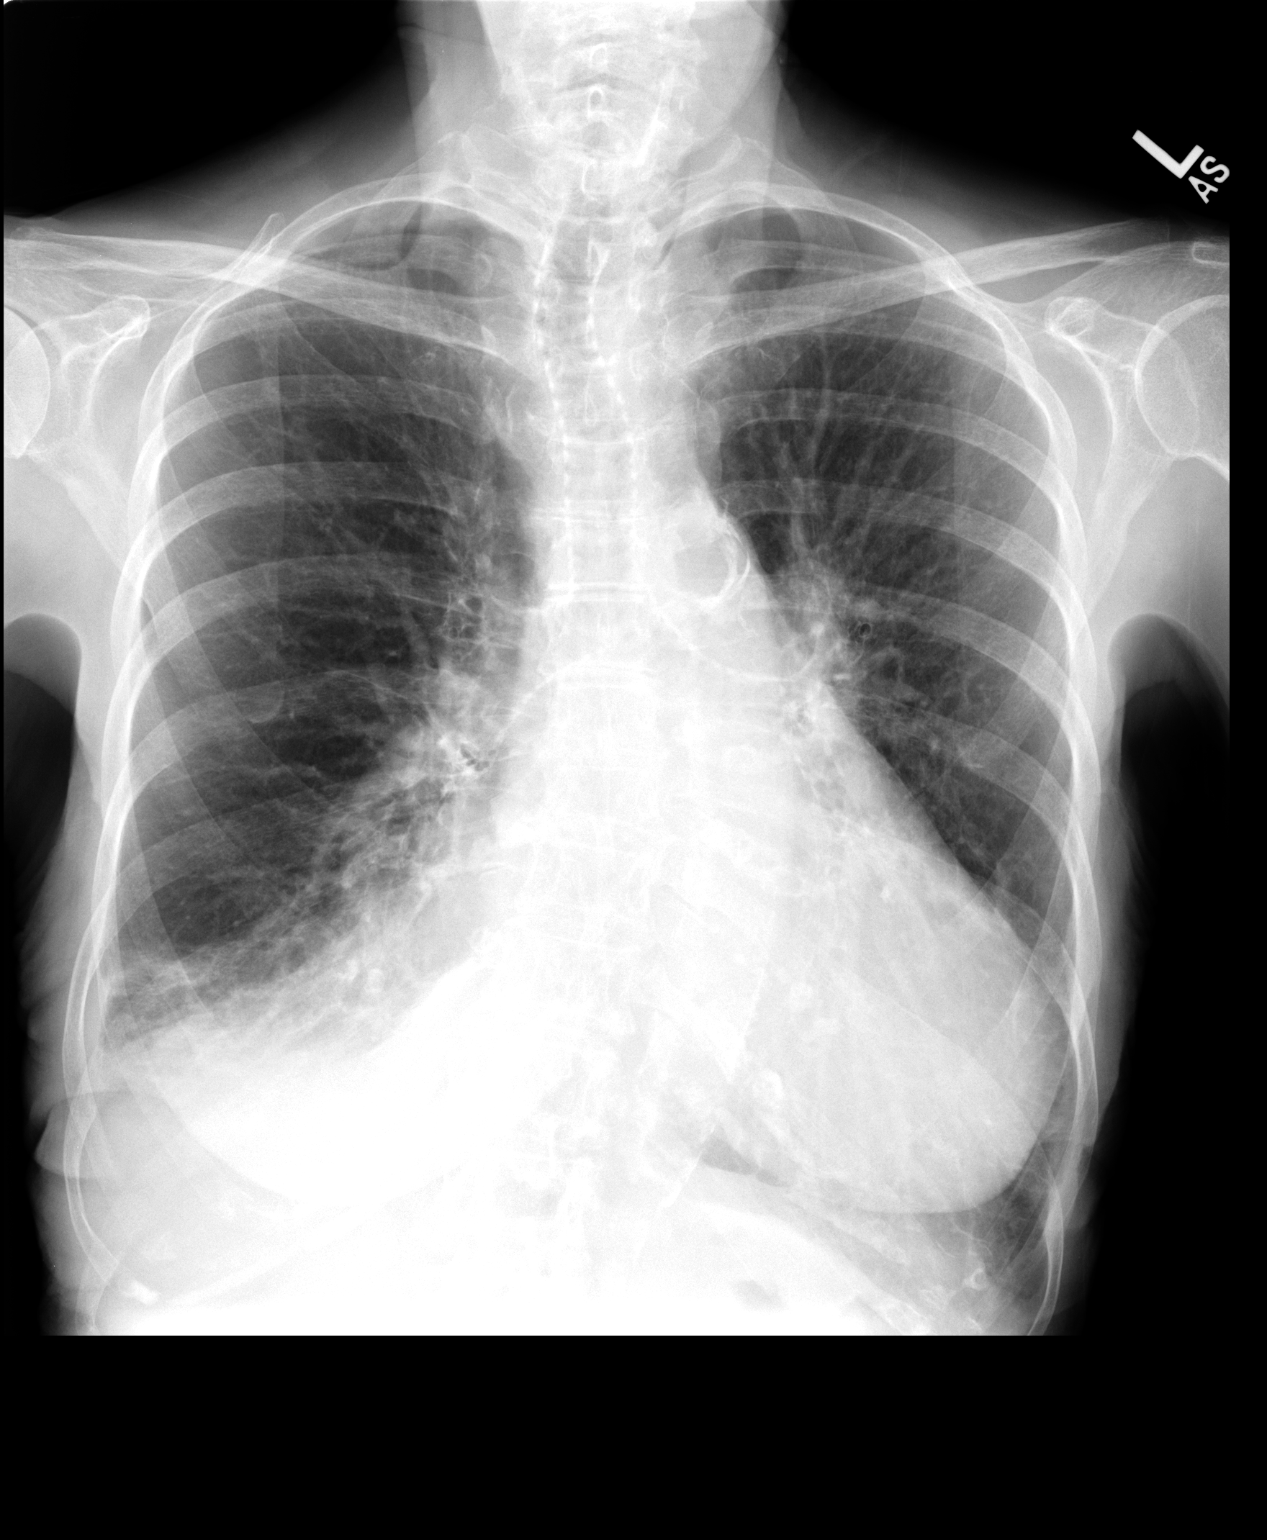

[view not recorded (2 of 2)]
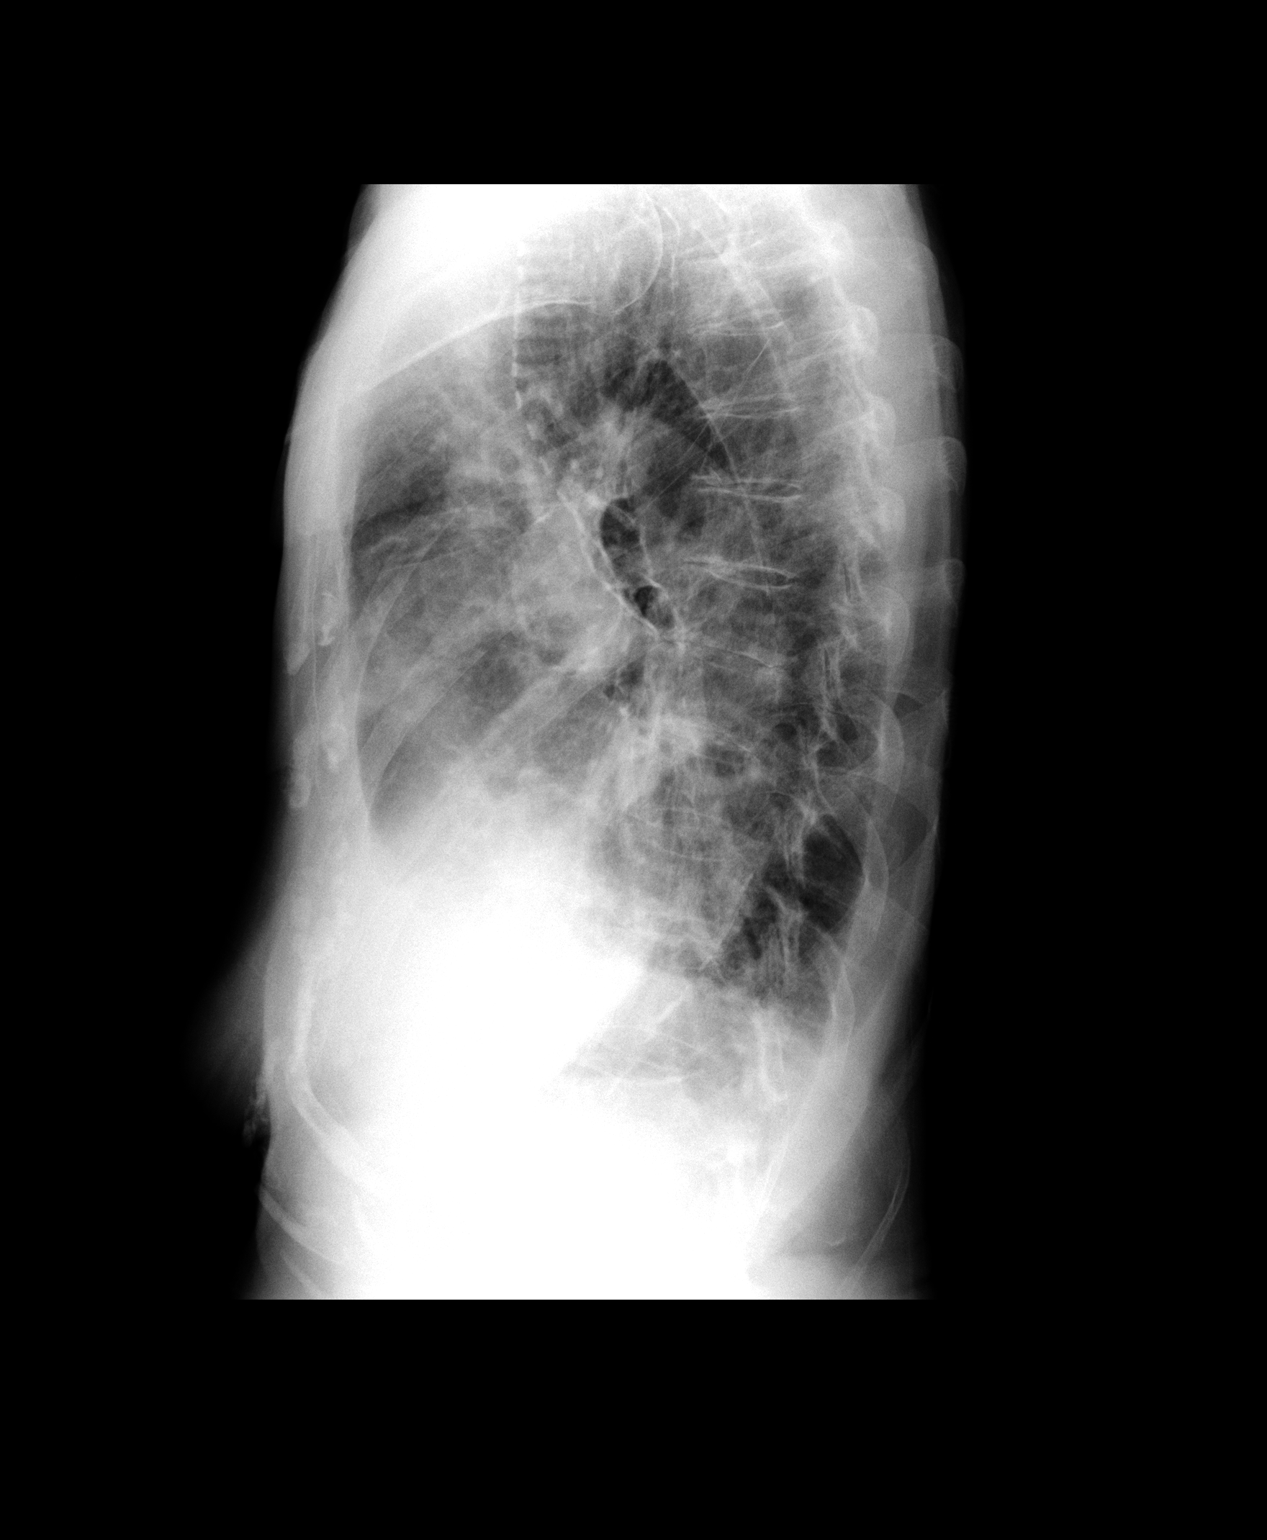

[2 of 2 positions shown; findings below may reference images not displayed]

FINDINGS: There is parenchymal opacity at the right lung base which
appears primarily to involve the right middle lobe and possibly the
anterior basal right lower lobe, most consistent with pneumonia.
There may be a tiny right pleural effusion present.  The left lung
is clear.  Moderate cardiomegaly is noted.  There is a slight
thoracic kyphosis present and the bones are somewhat osteopenic.
IMPRESSION: Opacity in the right middle lobe and possibly anterior basal right
lower lobe most consistent with pneumonia.  Moderate cardiomegaly.

## 2011-09-04 ENCOUNTER — Other Ambulatory Visit: Payer: Self-pay | Admitting: Cardiology

## 2011-09-05 ENCOUNTER — Ambulatory Visit: Payer: Self-pay | Admitting: Cardiology

## 2011-09-05 DIAGNOSIS — I4891 Unspecified atrial fibrillation: Secondary | ICD-10-CM

## 2011-10-02 ENCOUNTER — Ambulatory Visit (INDEPENDENT_AMBULATORY_CARE_PROVIDER_SITE_OTHER): Payer: Self-pay | Admitting: Cardiology

## 2011-10-02 DIAGNOSIS — I4891 Unspecified atrial fibrillation: Secondary | ICD-10-CM

## 2011-10-02 DIAGNOSIS — R0989 Other specified symptoms and signs involving the circulatory and respiratory systems: Secondary | ICD-10-CM

## 2011-10-02 LAB — POCT INR: INR: 2.7

## 2011-10-04 ENCOUNTER — Other Ambulatory Visit: Payer: Self-pay | Admitting: Cardiology

## 2011-10-24 ENCOUNTER — Other Ambulatory Visit: Payer: Self-pay | Admitting: Cardiology

## 2011-10-30 ENCOUNTER — Ambulatory Visit: Payer: Self-pay | Admitting: Cardiology

## 2011-10-30 DIAGNOSIS — I4891 Unspecified atrial fibrillation: Secondary | ICD-10-CM

## 2011-10-30 LAB — POCT INR: INR: 2.7

## 2011-11-09 ENCOUNTER — Ambulatory Visit (INDEPENDENT_AMBULATORY_CARE_PROVIDER_SITE_OTHER): Payer: Medicare Other | Admitting: Cardiology

## 2011-11-09 ENCOUNTER — Encounter: Payer: Self-pay | Admitting: Cardiology

## 2011-11-09 VITALS — BP 140/70 | HR 86 | Ht 61.0 in | Wt 101.4 lb

## 2011-11-09 DIAGNOSIS — R5381 Other malaise: Secondary | ICD-10-CM

## 2011-11-09 DIAGNOSIS — I4891 Unspecified atrial fibrillation: Secondary | ICD-10-CM

## 2011-11-09 DIAGNOSIS — I341 Nonrheumatic mitral (valve) prolapse: Secondary | ICD-10-CM

## 2011-11-09 DIAGNOSIS — Z7901 Long term (current) use of anticoagulants: Secondary | ICD-10-CM

## 2011-11-09 DIAGNOSIS — I509 Heart failure, unspecified: Secondary | ICD-10-CM

## 2011-11-09 DIAGNOSIS — R5383 Other fatigue: Secondary | ICD-10-CM | POA: Insufficient documentation

## 2011-11-09 DIAGNOSIS — I059 Rheumatic mitral valve disease, unspecified: Secondary | ICD-10-CM

## 2011-11-09 NOTE — Assessment & Plan Note (Signed)
Rate is well controlled and she is anticoagulated with a therapeutic INR. We will continue her current therapy.

## 2011-11-09 NOTE — Assessment & Plan Note (Signed)
She really appears to be very well compensated from a heart failure standpoint. Her weight is down 5 pounds. She has no edema. Really quite surprised how well she has done given the severity of her mitral insufficiency. We will continue with her ACE inhibitor and current Lasix dose.

## 2011-11-09 NOTE — Assessment & Plan Note (Signed)
She has chronic severe mitral insufficiency related to severe mitral valve prolapse. Her exam is unchanged and she is symptomatically stable.

## 2011-11-09 NOTE — Patient Instructions (Signed)
Continue your current therapy.  I would recommend checking your thyroid levels. We can do this at your convenience  I will see you again in 3 months.

## 2011-11-09 NOTE — Assessment & Plan Note (Signed)
I do not understand the cause for her increased fatigue. I recommended checking thyroid function studies she is convinced this is related to her vision. She is going to be fitted for new glasses and she wants to wait until after she has adjusted to her new glasses.

## 2011-11-09 NOTE — Progress Notes (Signed)
HPI Rose Weber is seen for follow up of Chf, atrial fibrillation, and mitral insufficiency. She complains that she is always tired. This has been worse since December. She feels that this is closely linked to her vision and that when she is tired her vision is worse as well. She admits that she has been under some increased stress. She's had a lot of visitors stay with her over the past few months. She denies any increase shortness of breath, palpitations, dizziness, or edema. She takes Lasix 20 mg a day and takes an extra dose if needed. She is only needed to take an extra dose about twice since her last visit. And in  Allergies  Allergen Reactions  . Alphagan (Brimonidine Tartrate)   . Bystolic (Nebivolol Hcl)   . Diovan (Valsartan)   . Other     PRESERVATIVES  . Penicillins   . Polytrim   . Sulfa Drugs Cross Reactors   . Tekturna (Aliskiren Fumarate)   . Travatan Z     Current Outpatient Prescriptions on File Prior to Visit  Medication Sig Dispense Refill  . amLODipine (NORVASC) 5 MG tablet Take 1 tablet (5 mg total) by mouth daily.  60 tablet  6  . Ascorbic Acid (VITAMIN C PO) Take by mouth.        . bismuth subsalicylate (PEPTO BISMOL) 262 MG/15ML suspension Take 15 mLs by mouth as needed.        . Calcium-Magnesium-Vitamin D (CALCIUM MAGNESIUM PO) Take 200 mg by mouth 3 (three) times daily.       . carboxymethylcellulose (REFRESH PLUS) 0.5 % SOLN Apply 1 drop to eye 3 (three) times daily as needed.        . Cholecalciferol (VITAMIN D) 2000 UNIT tablet Take 2,000 Units by mouth daily.        . CYCLOSPORINE OP Apply 2 % to eye 3 (three) times daily.        Marland Kitchen erythromycin ophthalmic ointment Place 1 application into the left eye at bedtime. PRN      . estrogens, conjugated, (PREMARIN) 0.3 MG tablet Take 0.3 mg by mouth daily. Take daily for 21 days then do not take for 7 days.       Marland Kitchen K-DUR 20 MEQ tablet TAKE 1 TABLET DAILY.  30 each  5  . LASIX 20 MG tablet TAKE 1 TABLET DAILY. MAY  TAKE AN ADDITIONAL TABLET AS NEEDED FOR SWELLING OR WEIGHT GAIN.  60 each  1  . levothyroxine (SYNTHROID, LEVOTHROID) 25 MCG tablet Take 25 mcg by mouth daily.        Marland Kitchen LOTENSIN 40 MG tablet TAKE 1 TABLET TWICE DAILY.  60 each  6  . PrednisoLONE Acetate (PRED FORTE OP) Apply 0.25 % to eye 3 (three) times daily.        . timolol (TIMOPTIC) 0.5 % ophthalmic solution 1 drop 2 (two) times daily.        Marland Kitchen VITAMIN E PO Take by mouth.        . warfarin (COUMADIN) 2.5 MG tablet Take as directed by anticoagulation clinic  56 each  3  . warfarin (COUMADIN) 5 MG tablet Take 5 mg x 6 days and 2.5 mg x 1 day (tues);or as directed by physician  45 tablet  5    Past Medical History  Diagnosis Date  . HTN (hypertension)   . MVP (mitral valve prolapse)   . Mitral insufficiency     SEVERE  . Chronic anticoagulation   .  Edema   . GERD (gastroesophageal reflux disease)   . Personal history of goiter   . Hypothyroidism   . CHF (congestive heart failure)   . Mitral valve prolapse     With severe mitral insufficiency  . Atrial fibrillation   . Goiter     History of goiter    Past Surgical History  Procedure Date  . Cardioversion 04/14/10  . Appendectomy   . Small intestine surgery     SBO 2000  . Abdominal hysterectomy   . Corneal transplant     MULTIPLE  . Rectal polypectomy     Family History  Problem Relation Age of Onset  . Stroke Sister   . Hip fracture Sister     History   Social History  . Marital Status: Widowed    Spouse Name: N/A    Number of Children: 1  . Years of Education: N/A   Occupational History  . 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 Smoking status: Never Smoker   . Smokeless tobacco: Not on file  . Alcohol Use: No  . Drug Use: No  . Sexually Active: Not on file   Other Topics Concern  . Not on file   Social History Narrative  . No narrative on file    ROS  As noted in history of present illness.  All other systems were  reviewed and are negative.  PHYSICAL EXAM BP 140/70  Pulse 86  Ht 5\' 1"  (1.549 m)  Wt 45.995 kg (101 lb 6.4 oz)  BMI 19.16 kg/m2 The patient is alert and oriented x 3.  The mood and affect are normal.  The skin is warm and dry.  Color is normal.  The HEENT exam reveals that the sclera are nonicteric. Vision is poor especially in the left eye.   The mucous membranes are moist.  The carotids are 2+ without bruits. There is a radiated murmur from the heart.  There is no thyromegaly.  There is no JVD.  The lungs are clear.  The chest wall is non tender.  The heart exam reveals a regular rate with a normal S1 and S2.  There  is a loud 4/6 systolic murmur at the apex radiating across the precordium and into the back. The PMI is not displaced.   Abdominal exam reveals good bowel sounds.  There is no guarding or rebound.  There is no hepatosplenomegaly or tenderness.  There are no masses.  Exam of the legs reveal no clubbing or cyanosis.  There is 1+ edema.The distal pulses are intact.  There is mild stasis dermatitis.Cranial nerves II - XII are intact.  Motor and sensory functions are intact.  She walks with a cane.  Laboratory data: We reviewed her lab data and December and her CBC and chemistries were all normal. Lipid panel was excellent.  ASSESSMENT AND PLAN

## 2011-12-18 LAB — POCT INR: INR: 2.8

## 2011-12-19 ENCOUNTER — Ambulatory Visit: Payer: Self-pay | Admitting: Cardiology

## 2011-12-19 DIAGNOSIS — I4891 Unspecified atrial fibrillation: Secondary | ICD-10-CM

## 2012-01-04 ENCOUNTER — Other Ambulatory Visit: Payer: Self-pay | Admitting: *Deleted

## 2012-01-04 MED ORDER — FUROSEMIDE 20 MG PO TABS: 20.0000 mg | ORAL_TABLET | ORAL | Status: DC

## 2012-01-25 DIAGNOSIS — R5381 Other malaise: Secondary | ICD-10-CM

## 2012-01-25 HISTORY — DX: Other malaise: R53.81

## 2012-01-30 ENCOUNTER — Ambulatory Visit (INDEPENDENT_AMBULATORY_CARE_PROVIDER_SITE_OTHER): Payer: Medicare Other | Admitting: Cardiovascular Disease

## 2012-01-30 DIAGNOSIS — I4891 Unspecified atrial fibrillation: Secondary | ICD-10-CM

## 2012-01-30 LAB — POCT INR: INR: 2.5

## 2012-02-08 ENCOUNTER — Ambulatory Visit: Payer: Medicare Other | Admitting: Cardiology

## 2012-02-15 ENCOUNTER — Other Ambulatory Visit: Payer: Self-pay | Admitting: Cardiology

## 2012-02-29 ENCOUNTER — Ambulatory Visit (INDEPENDENT_AMBULATORY_CARE_PROVIDER_SITE_OTHER): Payer: Medicare Other | Admitting: Cardiology

## 2012-02-29 ENCOUNTER — Encounter: Payer: Self-pay | Admitting: Cardiology

## 2012-02-29 VITALS — BP 148/78 | HR 64 | Ht 61.0 in | Wt 99.2 lb

## 2012-02-29 DIAGNOSIS — I340 Nonrheumatic mitral (valve) insufficiency: Secondary | ICD-10-CM

## 2012-02-29 DIAGNOSIS — R5383 Other fatigue: Secondary | ICD-10-CM

## 2012-02-29 DIAGNOSIS — I4891 Unspecified atrial fibrillation: Secondary | ICD-10-CM

## 2012-02-29 DIAGNOSIS — I341 Nonrheumatic mitral (valve) prolapse: Secondary | ICD-10-CM

## 2012-02-29 DIAGNOSIS — I059 Rheumatic mitral valve disease, unspecified: Secondary | ICD-10-CM

## 2012-02-29 DIAGNOSIS — E039 Hypothyroidism, unspecified: Secondary | ICD-10-CM

## 2012-02-29 DIAGNOSIS — R5381 Other malaise: Secondary | ICD-10-CM

## 2012-02-29 DIAGNOSIS — I509 Heart failure, unspecified: Secondary | ICD-10-CM

## 2012-02-29 NOTE — Progress Notes (Signed)
HPI Mrs. Ostrosky is seen for follow up of Chf, atrial fibrillation, and mitral insufficiency. She has a history of severe mitral valve prolapse. Her major complaint today is that she just generally doesn't feel well. When she gets tired her vision gets significantly worse. She has had multiple corneal transplants in the past but reports that her last transplant is functioning well. She thinks it may be linked to her blood pressure. She denies any increase in dyspnea. Her edema has done quite well.  Allergies  Allergen Reactions  . Alphagan (Brimonidine Tartrate)   . Bystolic (Nebivolol Hcl)   . Diovan (Valsartan)   . Other     PRESERVATIVES  . Penicillins   . Polymyxin B-Trimethoprim   . Sulfa Drugs Cross Reactors   . Tekturna (Aliskiren Fumarate)   . Travatan Z     Current Outpatient Prescriptions on File Prior to Visit  Medication Sig Dispense Refill  . amLODipine (NORVASC) 5 MG tablet Take 1 tablet (5 mg total) by mouth daily.  60 tablet  6  . Ascorbic Acid (VITAMIN C PO) Take by mouth.        . bismuth subsalicylate (PEPTO BISMOL) 262 MG/15ML suspension Take 15 mLs by mouth as needed.        . Calcium-Magnesium-Vitamin D (CALCIUM MAGNESIUM PO) Take 200 mg by mouth 3 (three) times daily.       . carboxymethylcellulose (REFRESH PLUS) 0.5 % SOLN Apply 1 drop to eye 3 (three) times daily as needed.        . Cholecalciferol (VITAMIN D) 2000 UNIT tablet Take 2,000 Units by mouth daily.        . CYCLOSPORINE OP Apply 2 % to eye 3 (three) times daily.        Marland Kitchen erythromycin ophthalmic ointment Place 1 application into the left eye at bedtime. PRN      . estrogens, conjugated, (PREMARIN) 0.3 MG tablet Take 0.3 mg by mouth daily. Take daily for 21 days then do not take for 7 days.       . furosemide (LASIX) 20 MG tablet Take 1 tablet (20 mg total) by mouth as directed.  60 tablet  6  . K-DUR 20 MEQ tablet TAKE 1 TABLET DAILY.  30 each  11  . levothyroxine (SYNTHROID, LEVOTHROID) 25 MCG tablet  Take 25 mcg by mouth daily.        Marland Kitchen LOTENSIN 40 MG tablet TAKE 1 TABLET TWICE DAILY.  60 each  6  . PrednisoLONE Acetate (PRED FORTE OP) Apply 0.25 % to eye 3 (three) times daily.        . timolol (TIMOPTIC) 0.5 % ophthalmic solution 1 drop 2 (two) times daily.        Marland Kitchen VITAMIN E PO Take by mouth.        . warfarin (COUMADIN) 2.5 MG tablet Take as directed by anticoagulation clinic  56 each  3  . warfarin (COUMADIN) 5 MG tablet Take 5 mg x 6 days and 2.5 mg x 1 day (tues);or as directed by physician  45 tablet  5    Past Medical History  Diagnosis Date  . HTN (hypertension)   . MVP (mitral valve prolapse)   . Mitral insufficiency     SEVERE  . Chronic anticoagulation   . Edema   . GERD (gastroesophageal reflux disease)   . Personal history of goiter   . Hypothyroidism   . CHF (congestive heart failure)   . Mitral valve prolapse  With severe mitral insufficiency  . Atrial fibrillation   . Goiter     History of goiter    Past Surgical History  Procedure Date  . Cardioversion 04/14/10  . Appendectomy   . Small intestine surgery     SBO 2000  . Abdominal hysterectomy   . Corneal transplant     MULTIPLE  . Rectal polypectomy     Family History  Problem Relation Age of Onset  . Stroke Sister   . Hip fracture Sister     History   Social History  . Marital Status: Widowed    Spouse Name: N/A    Number of Children: 1  . Years of Education: N/A   Occupational History  . 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 Smoking status: Never Smoker   . Smokeless tobacco: Not on file  . Alcohol Use: No  . Drug Use: No  . Sexually Active: Not on file   Other Topics Concern  . Not on file   Social History Narrative  . No narrative on file    ROS  As noted in history of present illness.  All other systems were reviewed and are negative.  PHYSICAL EXAM BP 148/78  Pulse 64  Ht 5\' 1"  (1.549 m)  Wt 99 lb 3.2 oz (44.997 kg)  BMI 18.74 kg/m2   SpO2 96% The patient is alert and oriented x 3.  The mood and affect are normal.  The skin is warm and dry.  Color is normal.  The HEENT exam reveals that the sclera are nonicteric. PERRLA.   The mucous membranes are moist.  The carotids are 2+ without bruits. There is a radiated murmur from the heart.  There is no thyromegaly.  There is no JVD.  The lungs are clear.  The chest wall is non tender.  The heart exam reveals an irregular rate with a normal S1 and S2.  There  is a loud 4/6 systolic murmur at the apex radiating across the precordium and into the back. The PMI is not displaced.   Abdominal exam reveals good bowel sounds.  There is no guarding or rebound.  There is no hepatosplenomegaly or tenderness.  There are no masses.  Exam of the legs reveal no clubbing or cyanosis.  There is no edema.The distal pulses are intact.  There is mild stasis dermatitis.Cranial nerves II - XII are intact.  Motor and sensory functions are intact.  She walks with a cane.  Laboratory data:   ASSESSMENT AND PLAN

## 2012-02-29 NOTE — Assessment & Plan Note (Signed)
She appears to be well compensated on her current diuretic dose. We will continue this the same. Because of her symptoms of fatigue and decreased vision we will reduce her Lotensin to 20 mg twice a day. We will also check a CBC, basic metabolic panel, and thyroid function studies today.

## 2012-02-29 NOTE — Assessment & Plan Note (Signed)
She has mitral valve prolapse with severe mitral insufficiency. She is not a candidate for repair at her advanced age.

## 2012-02-29 NOTE — Patient Instructions (Signed)
Reduce your lotensin to 20 mg twice a day. This is half your prior dose.  We will check lab work today and call with the results.  I will see you again in 3 months.

## 2012-02-29 NOTE — Assessment & Plan Note (Signed)
Rate is well controlled. We'll continue anticoagulation with Coumadin.

## 2012-02-29 NOTE — Assessment & Plan Note (Signed)
She is on low dose thyroid replacement. We will followup on her thyroid function studies today.

## 2012-03-01 LAB — CBC WITH DIFFERENTIAL/PLATELET
Basophils Absolute: 0 10*3/uL (ref 0.0–0.1)
Eosinophils Absolute: 0.1 10*3/uL (ref 0.0–0.7)
HCT: 41.3 % (ref 36.0–46.0)
Hemoglobin: 14 g/dL (ref 12.0–15.0)
Lymphs Abs: 1.2 10*3/uL (ref 0.7–4.0)
MCHC: 33.8 g/dL (ref 30.0–36.0)
Monocytes Absolute: 0.6 10*3/uL (ref 0.1–1.0)
Neutro Abs: 2.7 10*3/uL (ref 1.4–7.7)
RDW: 16.2 % — ABNORMAL HIGH (ref 11.5–14.6)

## 2012-03-01 LAB — BASIC METABOLIC PANEL
Calcium: 9.5 mg/dL (ref 8.4–10.5)
Creatinine, Ser: 0.8 mg/dL (ref 0.4–1.2)

## 2012-03-01 LAB — T4, FREE: Free T4: 1.31 ng/dL (ref 0.60–1.60)

## 2012-03-01 LAB — TSH: TSH: 1.16 u[IU]/mL (ref 0.35–5.50)

## 2012-03-05 ENCOUNTER — Telehealth: Payer: Self-pay | Admitting: Cardiology

## 2012-03-05 NOTE — Telephone Encounter (Signed)
Patient called stated ever since lotensin decreased to 20 mg twice a day she has been very dizzy.Stated wanted to go back to 40 mg.Spoke to Norma Fredrickson NP she advised to increase back to 40 mg twice a day and monitor B/P. Stated her B/P today was 142/70. Patient advised to call back if not better.

## 2012-03-05 NOTE — Telephone Encounter (Signed)
Please return call to patient at [phone], regarding medication discussion.

## 2012-03-12 ENCOUNTER — Ambulatory Visit: Payer: Self-pay | Admitting: Cardiology

## 2012-03-12 DIAGNOSIS — I4891 Unspecified atrial fibrillation: Secondary | ICD-10-CM

## 2012-03-14 ENCOUNTER — Other Ambulatory Visit: Payer: Self-pay | Admitting: Cardiology

## 2012-04-04 ENCOUNTER — Other Ambulatory Visit: Payer: Self-pay | Admitting: Cardiology

## 2012-04-23 ENCOUNTER — Ambulatory Visit: Payer: Self-pay | Admitting: Cardiovascular Disease

## 2012-04-23 DIAGNOSIS — I4891 Unspecified atrial fibrillation: Secondary | ICD-10-CM

## 2012-05-14 DIAGNOSIS — H401190 Primary open-angle glaucoma, unspecified eye, stage unspecified: Secondary | ICD-10-CM | POA: Insufficient documentation

## 2012-05-15 DIAGNOSIS — Z947 Corneal transplant status: Secondary | ICD-10-CM | POA: Insufficient documentation

## 2012-05-15 DIAGNOSIS — H18519 Endothelial corneal dystrophy, unspecified eye: Secondary | ICD-10-CM | POA: Insufficient documentation

## 2012-06-03 ENCOUNTER — Ambulatory Visit: Payer: Self-pay | Admitting: Cardiovascular Disease

## 2012-06-03 DIAGNOSIS — I4891 Unspecified atrial fibrillation: Secondary | ICD-10-CM

## 2012-06-06 ENCOUNTER — Ambulatory Visit: Payer: Medicare Other | Admitting: Cardiology

## 2012-07-09 ENCOUNTER — Encounter: Payer: Self-pay | Admitting: Cardiology

## 2012-07-09 ENCOUNTER — Ambulatory Visit (INDEPENDENT_AMBULATORY_CARE_PROVIDER_SITE_OTHER): Payer: Medicare Other | Admitting: Cardiology

## 2012-07-09 VITALS — BP 139/78 | HR 82 | Ht 61.0 in | Wt 102.0 lb

## 2012-07-09 DIAGNOSIS — I509 Heart failure, unspecified: Secondary | ICD-10-CM

## 2012-07-09 DIAGNOSIS — I059 Rheumatic mitral valve disease, unspecified: Secondary | ICD-10-CM

## 2012-07-09 DIAGNOSIS — I341 Nonrheumatic mitral (valve) prolapse: Secondary | ICD-10-CM

## 2012-07-09 DIAGNOSIS — I4891 Unspecified atrial fibrillation: Secondary | ICD-10-CM

## 2012-07-09 DIAGNOSIS — Z7901 Long term (current) use of anticoagulants: Secondary | ICD-10-CM

## 2012-07-09 NOTE — Patient Instructions (Signed)
Continue your current medication and sodium restriction  I will see you again in 6 months.   

## 2012-07-09 NOTE — Progress Notes (Signed)
HPI Rose Weber is seen for follow up of Chf, atrial fibrillation, and mitral insufficiency. She has a history of severe mitral valve prolapse. And she is a cardiac standpoint. She is concerned about her declining vision and apparently had an allergic reaction to her glaucoma medication. She is now on some new eyedrops. She denies any significant shortness of breath or increase in edema. She has no palpitations or dizziness.  Allergies  Allergen Reactions  . Alphagan (Brimonidine Tartrate)   . Bystolic (Nebivolol Hcl)   . Diovan (Valsartan)   . Other     PRESERVATIVES  . Penicillins   . Polymyxin B-Trimethoprim   . Sulfa Drugs Cross Reactors   . Tekturna (Aliskiren Fumarate)   . Travatan Z     Current Outpatient Prescriptions on File Prior to Visit  Medication Sig Dispense Refill  . amLODipine (NORVASC) 5 MG tablet Take 1 tablet (5 mg total) by mouth daily.  60 tablet  6  . Ascorbic Acid (VITAMIN C PO) Take by mouth.        . bismuth subsalicylate (PEPTO BISMOL) 262 MG/15ML suspension Take 15 mLs by mouth as needed.        . Calcium-Magnesium-Vitamin D (CALCIUM MAGNESIUM PO) Take 200 mg by mouth 3 (three) times daily.       . carboxymethylcellulose (REFRESH PLUS) 0.5 % SOLN Apply 1 drop to eye 3 (three) times daily as needed.       . Cholecalciferol (VITAMIN D) 2000 UNIT tablet Take 2,000 Units by mouth daily.        Marland Kitchen COUMADIN 2.5 MG tablet TAKE AS DIRECTED BY ANTICOAGULATION CLINIC.  55 each  3  . CYCLOSPORINE OP Apply 2 % to eye 3 (three) times daily.        Marland Kitchen erythromycin ophthalmic ointment Place 1 application into the left eye at bedtime. PRN      . estrogens, conjugated, (PREMARIN) 0.3 MG tablet Take 0.3 mg by mouth daily. Take daily for 21 days then do not take for 7 days.       . furosemide (LASIX) 20 MG tablet Take 1 tablet (20 mg total) by mouth as directed.  60 tablet  6  . K-DUR 20 MEQ tablet TAKE 1 TABLET DAILY.  30 each  11  . levothyroxine (SYNTHROID, LEVOTHROID) 25 MCG  tablet Take 25 mcg by mouth daily.        Marland Kitchen LOTENSIN 40 MG tablet TAKE 1 TABLET TWICE DAILY.  60 each  5  . PrednisoLONE Acetate (PRED FORTE OP) Apply 0.25 % to eye 3 (three) times daily.        . timolol (TIMOPTIC) 0.5 % ophthalmic solution 1 drop 2 (two) times daily.        Marland Kitchen VITAMIN E PO Take by mouth.        . warfarin (COUMADIN) 5 MG tablet Take 5 mg x 6 days and 2.5 mg x 1 day (tues);or as directed by physician  45 tablet  5    Past Medical History  Diagnosis Date  . HTN (hypertension)   . MVP (mitral valve prolapse)   . Mitral insufficiency     SEVERE  . Chronic anticoagulation   . Edema   . GERD (gastroesophageal reflux disease)   . Personal history of goiter   . Hypothyroidism   . CHF (congestive heart failure)   . Mitral valve prolapse     With severe mitral insufficiency  . Atrial fibrillation   . Goiter  History of goiter    Past Surgical History  Procedure Date  . Cardioversion 04/14/10  . Appendectomy   . Small intestine surgery     SBO 2000  . Abdominal hysterectomy   . Corneal transplant     MULTIPLE  . Rectal polypectomy     Family History  Problem Relation Age of Onset  . Stroke Sister   . Hip fracture Sister     History   Social History  . Marital Status: Widowed    Spouse Name: N/A    Number of Children: 1  . Years of Education: N/A   Occupational History  . 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 Smoking status: Never Smoker   . Smokeless tobacco: Not on file  . Alcohol Use: No  . Drug Use: No  . Sexually Active: Not on file   Other Topics Concern  . Not on file   Social History Narrative  . No narrative on file    ROS  As noted in history of present illness.  All other systems were reviewed and are negative.  PHYSICAL EXAM BP 139/78  Pulse 82  Ht 5\' 1"  (1.549 m)  Wt 46.267 kg (102 lb)  BMI 19.27 kg/m2 The patient is alert and oriented x 3.    The skin is warm and dry.  Color is normal.  The  HEENT exam reveals that the sclera are nonicteric. PERRLA.   The mucous membranes are moist.  The carotids are 2+ without bruits. There is a radiated murmur from the heart.  There is no thyromegaly.  There is no JVD.  The lungs are clear.    The heart exam reveals an irregular rate with a normal S1 and S2.  There  is a loud 4/6 systolic murmur at the apex radiating across the precordium and into the back. The PMI is not displaced.   Abdominal exam reveals good bowel sounds.    There are no masses.  Exam of the legs reveal no clubbing or cyanosis.  There is no edema.The distal pulses are intact.  There is mild stasis dermatitis.Cranial nerves II - XII are intact.  Motor and sensory functions are intact.  She walks with a cane.  Laboratory data:  ECG today demonstrates atrial fibrillation with a ventricular response of 90 beats per minute. She has a right superior axis deviation and an incomplete right bundle branch block. She has nonspecific ST-T wave abnormality.  ASSESSMENT AND PLAN  1. Congestive heart failure secondary to severe mitral insufficiency. She appears to be well compensated on her current diuretic dose. We will continue with her current medication.  2. Atrial fibrillation. Rate is well controlled on no rate controlling medication. She is on chronic Coumadin therapy.  3. Mitral valve prolapse with severe mitral insufficiency.  4. Hypothyroidism. Thyroid function studies in July were normal.

## 2012-07-16 ENCOUNTER — Ambulatory Visit: Payer: Self-pay | Admitting: Internal Medicine

## 2012-07-16 DIAGNOSIS — I4891 Unspecified atrial fibrillation: Secondary | ICD-10-CM

## 2012-08-02 ENCOUNTER — Other Ambulatory Visit: Payer: Self-pay | Admitting: *Deleted

## 2012-08-02 ENCOUNTER — Other Ambulatory Visit: Payer: Self-pay

## 2012-08-02 MED ORDER — AMLODIPINE BESYLATE 5 MG PO TABS: 5.0000 mg | ORAL_TABLET | Freq: Every day | ORAL | Status: DC

## 2012-08-02 MED ORDER — WARFARIN SODIUM 2.5 MG PO TABS: 2.5000 mg | ORAL_TABLET | Freq: Every day | ORAL | Status: DC

## 2012-08-27 ENCOUNTER — Ambulatory Visit: Payer: Self-pay | Admitting: Cardiology

## 2012-08-27 DIAGNOSIS — I4891 Unspecified atrial fibrillation: Secondary | ICD-10-CM

## 2012-08-28 ENCOUNTER — Encounter: Payer: Self-pay | Admitting: Cardiovascular Disease

## 2012-08-28 DIAGNOSIS — I4891 Unspecified atrial fibrillation: Secondary | ICD-10-CM

## 2012-08-28 NOTE — Progress Notes (Signed)
This encounter was created in error - please disregard.

## 2012-09-24 ENCOUNTER — Ambulatory Visit: Payer: Self-pay | Admitting: Cardiology

## 2012-09-24 DIAGNOSIS — I4891 Unspecified atrial fibrillation: Secondary | ICD-10-CM

## 2012-09-24 LAB — POCT INR: INR: 4.1

## 2012-10-01 ENCOUNTER — Ambulatory Visit: Payer: Self-pay | Admitting: Cardiology

## 2012-10-01 DIAGNOSIS — I4891 Unspecified atrial fibrillation: Secondary | ICD-10-CM

## 2012-10-02 ENCOUNTER — Other Ambulatory Visit: Payer: Self-pay | Admitting: Cardiology

## 2012-10-15 ENCOUNTER — Ambulatory Visit: Payer: Self-pay | Admitting: Cardiovascular Disease

## 2012-10-15 DIAGNOSIS — I4891 Unspecified atrial fibrillation: Secondary | ICD-10-CM

## 2012-10-15 LAB — POCT INR: INR: 3.3

## 2012-10-29 LAB — POCT INR: INR: 2.3

## 2012-10-30 ENCOUNTER — Ambulatory Visit: Payer: Self-pay | Admitting: Cardiovascular Disease

## 2012-10-30 DIAGNOSIS — I4891 Unspecified atrial fibrillation: Secondary | ICD-10-CM

## 2012-10-31 ENCOUNTER — Other Ambulatory Visit: Payer: Self-pay | Admitting: *Deleted

## 2012-10-31 MED ORDER — WARFARIN SODIUM 2.5 MG PO TABS: ORAL_TABLET | ORAL | Status: DC

## 2012-11-12 ENCOUNTER — Ambulatory Visit (INDEPENDENT_AMBULATORY_CARE_PROVIDER_SITE_OTHER): Payer: Medicare Other | Admitting: Cardiovascular Disease

## 2012-11-12 DIAGNOSIS — I4891 Unspecified atrial fibrillation: Secondary | ICD-10-CM

## 2012-11-12 LAB — POCT INR: INR: 1.9

## 2012-12-03 ENCOUNTER — Ambulatory Visit (INDEPENDENT_AMBULATORY_CARE_PROVIDER_SITE_OTHER): Payer: Medicare Other | Admitting: Cardiovascular Disease

## 2012-12-03 DIAGNOSIS — I4891 Unspecified atrial fibrillation: Secondary | ICD-10-CM

## 2012-12-03 LAB — POCT INR: INR: 3.1

## 2012-12-24 ENCOUNTER — Ambulatory Visit (INDEPENDENT_AMBULATORY_CARE_PROVIDER_SITE_OTHER): Payer: Medicare Other | Admitting: Cardiology

## 2012-12-24 DIAGNOSIS — I4891 Unspecified atrial fibrillation: Secondary | ICD-10-CM

## 2012-12-24 LAB — PROTIME-INR: INR: 2.5 — AB (ref 0.9–1.1)

## 2013-01-09 ENCOUNTER — Ambulatory Visit: Payer: Medicare Other | Admitting: Cardiology

## 2013-01-10 ENCOUNTER — Emergency Department (HOSPITAL_COMMUNITY): Payer: Medicare Other

## 2013-01-10 ENCOUNTER — Encounter (HOSPITAL_COMMUNITY): Payer: Self-pay | Admitting: Radiology

## 2013-01-10 ENCOUNTER — Inpatient Hospital Stay (HOSPITAL_COMMUNITY)
Admission: EM | Admit: 2013-01-10 | Discharge: 2013-01-15 | DRG: 291 | Disposition: A | Discharge status: Skilled Nursing Facility | Payer: Medicare Other | Attending: Internal Medicine | Admitting: Internal Medicine

## 2013-01-10 DIAGNOSIS — Z66 Do not resuscitate: Secondary | ICD-10-CM | POA: Diagnosis present

## 2013-01-10 DIAGNOSIS — J154 Pneumonia due to other streptococci: Secondary | ICD-10-CM | POA: Diagnosis present

## 2013-01-10 DIAGNOSIS — J189 Pneumonia, unspecified organism: Secondary | ICD-10-CM

## 2013-01-10 DIAGNOSIS — I472 Ventricular tachycardia, unspecified: Secondary | ICD-10-CM | POA: Diagnosis present

## 2013-01-10 DIAGNOSIS — I4729 Other ventricular tachycardia: Secondary | ICD-10-CM | POA: Diagnosis present

## 2013-01-10 DIAGNOSIS — I341 Nonrheumatic mitral (valve) prolapse: Secondary | ICD-10-CM | POA: Diagnosis present

## 2013-01-10 DIAGNOSIS — R609 Edema, unspecified: Secondary | ICD-10-CM

## 2013-01-10 DIAGNOSIS — E039 Hypothyroidism, unspecified: Secondary | ICD-10-CM | POA: Diagnosis present

## 2013-01-10 DIAGNOSIS — K219 Gastro-esophageal reflux disease without esophagitis: Secondary | ICD-10-CM | POA: Diagnosis present

## 2013-01-10 DIAGNOSIS — I059 Rheumatic mitral valve disease, unspecified: Secondary | ICD-10-CM | POA: Diagnosis present

## 2013-01-10 DIAGNOSIS — J159 Unspecified bacterial pneumonia: Secondary | ICD-10-CM | POA: Diagnosis present

## 2013-01-10 DIAGNOSIS — I4891 Unspecified atrial fibrillation: Secondary | ICD-10-CM

## 2013-01-10 DIAGNOSIS — I509 Heart failure, unspecified: Principal | ICD-10-CM

## 2013-01-10 DIAGNOSIS — I10 Essential (primary) hypertension: Secondary | ICD-10-CM | POA: Diagnosis present

## 2013-01-10 DIAGNOSIS — R0603 Acute respiratory distress: Secondary | ICD-10-CM

## 2013-01-10 DIAGNOSIS — R5383 Other fatigue: Secondary | ICD-10-CM

## 2013-01-10 DIAGNOSIS — I340 Nonrheumatic mitral (valve) insufficiency: Secondary | ICD-10-CM

## 2013-01-10 DIAGNOSIS — Z7901 Long term (current) use of anticoagulants: Secondary | ICD-10-CM

## 2013-01-10 DIAGNOSIS — J9600 Acute respiratory failure, unspecified whether with hypoxia or hypercapnia: Secondary | ICD-10-CM | POA: Diagnosis present

## 2013-01-10 DIAGNOSIS — Z8639 Personal history of other endocrine, nutritional and metabolic disease: Secondary | ICD-10-CM

## 2013-01-10 LAB — CBC WITH DIFFERENTIAL/PLATELET
Basophils Relative: 1 % (ref 0–1)
Eosinophils Absolute: 0 10*3/uL (ref 0.0–0.7)
HCT: 43.2 % (ref 36.0–46.0)
Hemoglobin: 14.5 g/dL (ref 12.0–15.0)
MCH: 30.9 pg (ref 26.0–34.0)
MCHC: 33.6 g/dL (ref 30.0–36.0)
Monocytes Absolute: 0.6 10*3/uL (ref 0.1–1.0)
Neutro Abs: 4.8 10*3/uL (ref 1.7–7.7)
Neutrophils Relative %: 77 % (ref 43–77)

## 2013-01-10 LAB — COMPREHENSIVE METABOLIC PANEL
Albumin: 3.4 g/dL — ABNORMAL LOW (ref 3.5–5.2)
BUN: 17 mg/dL (ref 6–23)
Calcium: 8.8 mg/dL (ref 8.4–10.5)
Creatinine, Ser: 0.52 mg/dL (ref 0.50–1.10)
Total Protein: 6.9 g/dL (ref 6.0–8.3)

## 2013-01-10 LAB — URINALYSIS, ROUTINE W REFLEX MICROSCOPIC
Leukocytes, UA: NEGATIVE
Nitrite: NEGATIVE
Specific Gravity, Urine: 1.011 (ref 1.005–1.030)
pH: 5.5 (ref 5.0–8.0)

## 2013-01-10 LAB — PRO B NATRIURETIC PEPTIDE: Pro B Natriuretic peptide (BNP): 7369 pg/mL — ABNORMAL HIGH (ref 0–450)

## 2013-01-10 MED ORDER — BENAZEPRIL HCL 40 MG PO TABS
40.0000 mg | ORAL_TABLET | Freq: Two times a day (BID) | ORAL | Status: DC
  Administered 2013-01-10 – 2013-01-13 (×6): 40 mg via ORAL
  Filled 2013-01-10 (×7): qty 1

## 2013-01-10 MED ORDER — DEXTROSE 5 % IV SOLN
1.0000 g | Freq: Three times a day (TID) | INTRAVENOUS | Status: DC
Start: 2013-01-10 — End: 2013-01-11
  Administered 2013-01-10 – 2013-01-11 (×3): 1 g via INTRAVENOUS
  Filled 2013-01-10 (×5): qty 1

## 2013-01-10 MED ORDER — FUROSEMIDE 10 MG/ML IJ SOLN
40.0000 mg | Freq: Every day | INTRAMUSCULAR | Status: DC
  Administered 2013-01-11 – 2013-01-12 (×2): 40 mg via INTRAVENOUS
  Filled 2013-01-10 (×2): qty 4

## 2013-01-10 MED ORDER — VANCOMYCIN HCL IN DEXTROSE 1-5 GM/200ML-% IV SOLN
1000.0000 mg | INTRAVENOUS | Status: DC
  Filled 2013-01-10: qty 200

## 2013-01-10 MED ORDER — POTASSIUM CHLORIDE CRYS ER 20 MEQ PO TBCR
40.0000 meq | EXTENDED_RELEASE_TABLET | Freq: Once | ORAL | Status: AC
  Administered 2013-01-10: 40 meq via ORAL
  Filled 2013-01-10: qty 2

## 2013-01-10 MED ORDER — SODIUM CHLORIDE 0.9 % IV SOLN
INTRAVENOUS | Status: AC
  Administered 2013-01-10: 500 mL via INTRAVENOUS

## 2013-01-10 MED ORDER — WARFARIN SODIUM 5 MG PO TABS
5.0000 mg | ORAL_TABLET | Freq: Once | ORAL | Status: AC
  Administered 2013-01-10: 5 mg via ORAL
  Filled 2013-01-10: qty 1

## 2013-01-10 MED ORDER — ESTROGENS CONJUGATED 0.3 MG PO TABS: 0.3000 mg | ORAL_TABLET | ORAL | Status: DC

## 2013-01-10 MED ORDER — BIOTENE DRY MOUTH MT LIQD: 15.0000 mL | Freq: Two times a day (BID) | OROMUCOSAL | Status: DC

## 2013-01-10 MED ORDER — DEXTROSE 5 % IV SOLN
2.0000 g | Freq: Three times a day (TID) | INTRAVENOUS | Status: DC
  Administered 2013-01-10 (×2): 2 g via INTRAVENOUS
  Filled 2013-01-10 (×3): qty 2

## 2013-01-10 MED ORDER — VITAMIN D3 25 MCG (1000 UNIT) PO TABS
2000.0000 [IU] | ORAL_TABLET | Freq: Every day | ORAL | Status: DC
  Administered 2013-01-11 – 2013-01-15 (×5): 2000 [IU] via ORAL
  Filled 2013-01-10 (×5): qty 2

## 2013-01-10 MED ORDER — NITROGLYCERIN 2 % TD OINT
0.5000 [in_us] | TOPICAL_OINTMENT | Freq: Once | TRANSDERMAL | Status: AC
  Administered 2013-01-10: 0.5 [in_us] via TOPICAL
  Filled 2013-01-10: qty 1

## 2013-01-10 MED ORDER — CARBOXYMETHYLCELLULOSE SODIUM 0.5 % OP SOLN: 1.0000 [drp] | OPHTHALMIC | Status: DC | PRN

## 2013-01-10 MED ORDER — FUROSEMIDE 10 MG/ML IJ SOLN
INTRAMUSCULAR | Status: AC
  Administered 2013-01-10: 40 mg via INTRAVENOUS
  Filled 2013-01-10: qty 4

## 2013-01-10 MED ORDER — ALBUTEROL SULFATE (5 MG/ML) 0.5% IN NEBU: 2.5000 mg | INHALATION_SOLUTION | RESPIRATORY_TRACT | Status: DC | PRN

## 2013-01-10 MED ORDER — LEVOTHYROXINE SODIUM 25 MCG PO TABS
25.0000 ug | ORAL_TABLET | Freq: Every day | ORAL | Status: DC
  Administered 2013-01-11 – 2013-01-15 (×5): 25 ug via ORAL
  Filled 2013-01-10 (×6): qty 1

## 2013-01-10 MED ORDER — CHLORHEXIDINE GLUCONATE 0.12 % MT SOLN
15.0000 mL | Freq: Two times a day (BID) | OROMUCOSAL | Status: DC
  Filled 2013-01-10 (×2): qty 15

## 2013-01-10 MED ORDER — VANCOMYCIN HCL IN DEXTROSE 750-5 MG/150ML-% IV SOLN
750.0000 mg | INTRAVENOUS | Status: DC
Start: 2013-01-11 — End: 2013-01-11
  Administered 2013-01-11: 750 mg via INTRAVENOUS
  Filled 2013-01-10: qty 150

## 2013-01-10 MED ORDER — VANCOMYCIN HCL IN DEXTROSE 1-5 GM/200ML-% IV SOLN
1000.0000 mg | Freq: Once | INTRAVENOUS | Status: AC
  Administered 2013-01-10: 1000 mg via INTRAVENOUS

## 2013-01-10 MED ORDER — BIOTENE DRY MOUTH MT LIQD
15.0000 mL | Freq: Two times a day (BID) | OROMUCOSAL | Status: DC
  Administered 2013-01-10 – 2013-01-15 (×10): 15 mL via OROMUCOSAL

## 2013-01-10 MED ORDER — DEXTROSE 5 % IV SOLN
1.0000 g | INTRAVENOUS | Status: DC
  Filled 2013-01-10: qty 1

## 2013-01-10 MED ORDER — WARFARIN - PHARMACIST DOSING INPATIENT
Freq: Every day | Status: DC
  Administered 2013-01-14: 18:00:00

## 2013-01-10 MED ORDER — CALCIUM CARBONATE 600 MG PO TABS
600.0000 mg | ORAL_TABLET | Freq: Every day | ORAL | Status: DC
  Filled 2013-01-10: qty 1

## 2013-01-10 MED ORDER — REFRESH LACRI-LUBE OP OINT: 1.0000 "application " | TOPICAL_OINTMENT | Freq: Every day | OPHTHALMIC | Status: DC

## 2013-01-10 MED ORDER — POTASSIUM CHLORIDE CRYS ER 20 MEQ PO TBCR
20.0000 meq | EXTENDED_RELEASE_TABLET | Freq: Every day | ORAL | Status: DC
  Administered 2013-01-11 – 2013-01-15 (×5): 20 meq via ORAL
  Filled 2013-01-10 (×5): qty 1

## 2013-01-10 MED ORDER — TIMOLOL MALEATE 0.5 % OP SOLN
1.0000 [drp] | Freq: Two times a day (BID) | OPHTHALMIC | Status: DC
  Administered 2013-01-10 – 2013-01-15 (×10): 1 [drp] via OPHTHALMIC
  Filled 2013-01-10: qty 5

## 2013-01-10 MED ORDER — CYCLOSPORINE 0.05 % OP EMUL
1.0000 [drp] | Freq: Three times a day (TID) | OPHTHALMIC | Status: DC
  Administered 2013-01-10 – 2013-01-11 (×2): 1 [drp] via OPHTHALMIC
  Administered 2013-01-13: 11:00:00 via OPHTHALMIC
  Filled 2013-01-10 (×10): qty 1

## 2013-01-10 MED ORDER — VITAMIN D 50 MCG (2000 UT) PO TABS: 2000.0000 [IU] | ORAL_TABLET | Freq: Every day | ORAL | Status: DC

## 2013-01-10 MED ORDER — PREDNISOLONE ACETATE 1 % OP SUSP
1.0000 [drp] | Freq: Three times a day (TID) | OPHTHALMIC | Status: DC
  Administered 2013-01-10 – 2013-01-15 (×14): 1 [drp] via OPHTHALMIC
  Filled 2013-01-10: qty 1

## 2013-01-10 MED ORDER — FUROSEMIDE 10 MG/ML IJ SOLN
60.0000 mg | Freq: Once | INTRAMUSCULAR | Status: AC
  Administered 2013-01-10: 60 mg via INTRAVENOUS

## 2013-01-10 MED ORDER — AMLODIPINE BESYLATE 5 MG PO TABS
5.0000 mg | ORAL_TABLET | Freq: Every day | ORAL | Status: DC
  Administered 2013-01-10 – 2013-01-13 (×4): 5 mg via ORAL
  Filled 2013-01-10 (×5): qty 1

## 2013-01-10 MED ORDER — FUROSEMIDE 10 MG/ML IJ SOLN
INTRAMUSCULAR | Status: AC
  Filled 2013-01-10: qty 2

## 2013-01-10 MED ORDER — ARTIFICIAL TEARS OP OINT
TOPICAL_OINTMENT | Freq: Every day | OPHTHALMIC | Status: DC
  Administered 2013-01-10 – 2013-01-13 (×3): via OPHTHALMIC
  Filled 2013-01-10: qty 3.5

## 2013-01-10 MED ORDER — PREDNISOLONE ACETATE 1 % OP SUSP
1.0000 [drp] | Freq: Every day | OPHTHALMIC | Status: DC
  Administered 2013-01-11 – 2013-01-15 (×5): 1 [drp] via OPHTHALMIC

## 2013-01-10 MED ORDER — ALBUTEROL SULFATE (5 MG/ML) 0.5% IN NEBU
2.5000 mg | INHALATION_SOLUTION | Freq: Four times a day (QID) | RESPIRATORY_TRACT | Status: DC
  Administered 2013-01-10 – 2013-01-14 (×14): 2.5 mg via RESPIRATORY_TRACT
  Filled 2013-01-10 (×12): qty 0.5

## 2013-01-10 MED ORDER — BISMUTH SUBSALICYLATE 262 MG/15ML PO SUSP
15.0000 mL | ORAL | Status: DC | PRN
  Filled 2013-01-10: qty 236

## 2013-01-10 MED ORDER — FUROSEMIDE 10 MG/ML IJ SOLN
60.0000 mg | Freq: Once | INTRAMUSCULAR | Status: AC
  Administered 2013-01-10: 60 mg via INTRAVENOUS
  Filled 2013-01-10: qty 2

## 2013-01-10 MED ORDER — DEXTROSE 5 % IV SOLN: 1.0000 g | Freq: Three times a day (TID) | INTRAVENOUS | Status: DC

## 2013-01-10 MED ORDER — PREDNISOLONE ACETATE 1 % OP SUSP: 1.0000 [drp] | OPHTHALMIC | Status: DC

## 2013-01-10 MED ORDER — POLYVINYL ALCOHOL 1.4 % OP SOLN
1.0000 [drp] | OPHTHALMIC | Status: DC | PRN
  Administered 2013-01-11: 1 [drp] via OPHTHALMIC
  Filled 2013-01-10: qty 15

## 2013-01-10 NOTE — ED Notes (Signed)
[facility]. Pt reported abd pain X Monday better with mylanta Pt was at 72% on ra. Albuterol 10mg  and atrovent 0.5mg  125 mg of solumederol. Pt reports a productive cough

## 2013-01-10 NOTE — ED Provider Notes (Signed)
History     CSN: [id number]  Arrival date & time 01/10/13  1153   First MD Initiated Contact with Patient 01/10/13 1153      Chief Complaint  Patient presents with  . Respiratory Distress    (Consider location/radiation/quality/duration/timing/severity/associated sxs/prior treatment) HPI Patient presents in extremis from her nursing facility.  Per report the patient complained of abdominal pain, and upon EMS arrival the patient was tachypneic, hypoxic, with increased work of breathing, clearly in respiratory distress. On secondary report it is clear that the patient has had GI illness for approximately one week.  Seems as though symptoms were improved with Mylanta. The patient is incapable of providing any details of the history of present illness secondary to her respiratory distress. This is a level V caveat. She is currently receiving supple oxygen via noninvasive mechanical ventilation with BiPAP.   Past Medical History  Diagnosis Date  . HTN (hypertension)   . MVP (mitral valve prolapse)   . Mitral insufficiency     SEVERE  . Chronic anticoagulation   . Edema   . GERD (gastroesophageal reflux disease)   . Personal history of goiter   . Hypothyroidism   . CHF (congestive heart failure)   . Mitral valve prolapse     With severe mitral insufficiency  . Atrial fibrillation   . Goiter     History of goiter    Past Surgical History  Procedure Laterality Date  . Cardioversion  04/14/10  . Appendectomy    . Small intestine surgery      SBO 2000  . Abdominal hysterectomy    . Corneal transplant      MULTIPLE  . Rectal polypectomy      Family History  Problem Relation Age of Onset  . Stroke Sister   . Hip fracture Sister     History  Substance Use Topics  . Smoking status: Never Smoker   . Smokeless tobacco: Not on file  . Alcohol Use: No    OB History   Grav Para Term Preterm Abortions TAB SAB Ect Mult Living                  Review of Systems   Unable to perform ROS: Acuity of condition    Allergies  Alphagan; Bystolic; Diovan; Other; Penicillins; Polymyxin b-trimethoprim; Sulfa drugs cross reactors; Tekturna; and Travatan z  Home Medications   Current Outpatient Rx  Name  Route  Sig  Dispense  Refill  . amLODipine (NORVASC) 5 MG tablet   Oral   Take 1 tablet (5 mg total) by mouth daily.   60 tablet   6   . Artificial Tear Ointment (REFRESH LACRI-LUBE) OINT   Ophthalmic   Apply 1 application to eye at bedtime.         . benazepril (LOTENSIN) 40 MG tablet   Oral   Take 40 mg by mouth 2 (two) times daily.         Marland Kitchen bismuth subsalicylate (PEPTO BISMOL) 262 MG/15ML suspension   Oral   Take 15 mLs by mouth as needed for indigestion.          . calcium carbonate (OS-CAL) 600 MG TABS   Oral   Take 600 mg by mouth daily.         . carboxymethylcellulose (REFRESH PLUS) 0.5 % SOLN   Both Eyes   Place 1 drop into both eyes as needed (dry eyes).         . Cholecalciferol (VITAMIN  D) 2000 UNIT tablet   Oral   Take 2,000 Units by mouth daily.           . cycloSPORINE (RESTASIS) 0.05 % ophthalmic emulsion   Ophthalmic   Apply 1 drop to eye 3 (three) times daily.         Marland Kitchen estrogens, conjugated, (PREMARIN) 0.3 MG tablet   Oral   Take 0.3 mg by mouth See admin instructions. Take daily for 21 days then do not take for 7 days.         . furosemide (LASIX) 20 MG tablet   Oral   Take 20 mg by mouth See admin instructions. 20mg  daily and may take additional dose for edema if needed         . levothyroxine (SYNTHROID, LEVOTHROID) 25 MCG tablet   Oral   Take 25 mcg by mouth daily.           . potassium chloride SA (K-DUR,KLOR-CON) 20 MEQ tablet   Oral   Take 20 mEq by mouth daily.         . prednisoLONE acetate (PRED FORTE) 1 % ophthalmic suspension   Ophthalmic   Apply 1 drop to eye See admin instructions. Right eye three times a day; left eye daily         . timolol (TIMOPTIC) 0.5 %  ophthalmic solution   Both Eyes   Place 1 drop into both eyes 2 (two) times daily.          Marland Kitchen warfarin (COUMADIN) 2.5 MG tablet   Oral   Take 2.5-5 mg by mouth daily. 2.5mg  Tuesday, Thursday, Saturday; 5mg  the rest of the week           BP 162/71  Pulse 127  Temp(Src) 101.3 F (38.5 C) (Rectal)  Resp 36  SpO2 98%  Physical Exam  Nursing note and vitals reviewed. Constitutional: She is oriented to person, place, and time. She appears cachectic. She appears toxic. No distress. Face mask in place.  HENT:  Head: Normocephalic and atraumatic.  Eyes: Conjunctivae and EOM are normal.  Neck: No rigidity. No tracheal deviation and no edema present.  Cardiovascular: An irregularly irregular rhythm present. Tachycardia present.   Pulmonary/Chest: Accessory muscle usage present. Tachypnea noted. She is in respiratory distress. She has decreased breath sounds. She has wheezes.  Abdominal: Soft. Normal appearance. She exhibits no distension.  Musculoskeletal: She exhibits no edema.  Symmetric distal lower extremity edema  Neurological: She is alert and oriented to person, place, and time. No cranial nerve deficit.  Grossly intact with movement of all extremities spontaneously, but unable to do specific exam secondary to her clinical condition.  Skin: Skin is warm. She is diaphoretic. There is pallor.  Psychiatric: She has a normal mood and affect.    ED Course  Procedures (including critical care time)  Following the initial evaluation the patient received in.  Antibiotics for presumed pneumonia given her increased work of breathing, her hypoxia, tachypnea, tachycardia.  Additionally, the patient was afebrile.  After the initial evaluation, initial interventions were started, I discussed the patient's case with her son.  The patient's son offers that the patient would not likely want CPR or intubation, though other interventions, such as oxygen and antibiotics are reasonable.   Labs  Reviewed  CBC WITH DIFFERENTIAL - Abnormal; Notable for the following:    Platelets 144 (*)    All other components within normal limits  CULTURE, BLOOD (ROUTINE X 2)  CULTURE, BLOOD (ROUTINE  X 2)  GRAM STAIN  COMPREHENSIVE METABOLIC PANEL  PRO B NATRIURETIC PEPTIDE  URINALYSIS, ROUTINE W REFLEX MICROSCOPIC  LEGIONELLA ANTIGEN, URINE  STREP PNEUMONIAE URINARY ANTIGEN  PROTIME-INR   Dg Chest Portable 1 View  01/10/2013   *RADIOLOGY REPORT*  Clinical Data: Respiratory distress  PORTABLE CHEST - 1 VIEW  Comparison: 03/03/2010  Findings: Hyperexpansion is consistent with emphysema.  Patchy airspace disease is seen in the left upper lung and in the right base, suggesting pneumonia. The cardiopericardial silhouette is enlarged. Bones are diffusely demineralized. Telemetry leads overlie the chest.  IMPRESSION: Cardiomegaly with patchy bilateral airspace disease suggesting pneumonia.  Follow-up imaging is recommended to ensure resolution.   Original Report Authenticated By: Kennith Center, M.D.   X-ray consistent with pneumonia  No diagnosis found.  1:11 PM Patient improved substantially.  Will attempt to downgrade O2 > [place].  Cardiac: 121afib, abnormal O2- 99% BiPaP - abnormal  2:11 PM Patient substantially better.  Now on nasal cannula, with oxygen saturation low 90s.  Update: Patient continues to improve. Labs notable for demonstration of elevated BNP.  MDM  This 79 year old female presents in respiratory distress.  Given her history of heart failure, her known valvular disease, but with new  fever there is concern for both infectious and cardia vascular pathology.  Patient's evaluation and nitrates evidence of heart failure, fluid retention.  Following intervention with noninvasive mechanical ventilation, diuresis, nitroglycerin paste, the patient improved substantially.  However, she continued to require oxygen, 4 L to maintain 90% saturation.  Given the patient's healthcare acquired  pneumonia, her heart failure exacerbation, she required admission for further evaluation and management, to a step down unit. I discussed the patient's case with our admitting team, and with the patient's son.  CRITICAL CARE Performed by: Gerhard Munch Total critical care time: 35 Critical care time was exclusive of separately billable procedures and treating other patients. Critical care was necessary to treat or prevent imminent or life-threatening deterioration. Critical care was time spent personally by me on the following activities: development of treatment plan with patient and/or surrogate as well as nursing, discussions with consultants, evaluation of patient's response to treatment, examination of patient, obtaining history from patient or surrogate, ordering and performing treatments and interventions, ordering and review of laboratory studies, ordering and review of radiographic studies, pulse oximetry and re-evaluation of patient's condition.       Gerhard Munch, MD 01/10/13 [phone]

## 2013-01-10 NOTE — Progress Notes (Addendum)
ANTIBIOTIC CONSULT NOTE - INITIAL  Pharmacy Consult for Vancomycin x 8 days Indication: suspected pneumonia  Allergies  Allergen Reactions  . Alphagan (Brimonidine Tartrate)   . Bystolic (Nebivolol Hcl)   . Diovan (Valsartan)   . Other     PRESERVATIVES  . Penicillins   . Polymyxin B-Trimethoprim   . Sulfa Drugs Cross Reactors   . Tekturna (Aliskiren Fumarate)   . Travatan Z     Patient Measurements:     Vital Signs: Temp: 101.3 F (38.5 C) (05/23 1218) Temp src: Rectal (05/23 1218) BP: 144/75 mmHg (05/23 1218) Pulse Rate: 135 (05/23 1218) Intake/Output from previous day:   Intake/Output from this shift:    Labs: No results found for this basename: WBC, HGB, PLT, LABCREA, CREATININE,  in the last 72 hours The CrCl is unknown because both a height and weight (above a minimum accepted value) are required for this calculation. No results found for this basename: VANCOTROUGH, VANCOPEAK, VANCORANDOM, GENTTROUGH, GENTPEAK, GENTRANDOM, TOBRATROUGH, TOBRAPEAK, TOBRARND, AMIKACINPEAK, AMIKACINTROU, AMIKACIN,  in the last 72 hours   Microbiology: No results found for this or any previous visit (from the past 720 hour(s)).  Medical History: Past Medical History  Diagnosis Date  . HTN (hypertension)   . MVP (mitral valve prolapse)   . Mitral insufficiency     SEVERE  . Chronic anticoagulation   . Edema   . GERD (gastroesophageal reflux disease)   . Personal history of goiter   . Hypothyroidism   . CHF (congestive heart failure)   . Mitral valve prolapse     With severe mitral insufficiency  . Atrial fibrillation   . Goiter     History of goiter   Assessment: Ms. Riga is a 79yo F from assisted living facility admitted with respiratory distress. Noted baseline labs are pending. Given her living situation, she is at risk for MDR infections. No recent hospitalizations within 90 days noted. Also noted prior hx of Afib and chronic Coumadin use, INR not done yet. Noted  her last INR was 2.5 on 5/6 on Coumadin 5mg  daily except on TTSat takes 2.5mg .  Goal of Therapy:  Vancomycin trough level 15-20 mcg/ml  Plan:  - Vancomycin 1gm IV now, will f/up for baseline Scr and order maintenance dose for a total of 8 days - Will monitor cx/spec/sens, renal fn and clinical status daily. - Will check Css trough as appropriate. - Will order baseline PT/INR in preparation of resumption of anticoagulation.  Thanks, Meera K. Allena Katz, PharmD, BCPS.  Clinical Pharmacist Pager [phone]. 01/10/2013 12:49 PM    Addendum:   Baseline SCr 0.52  -Plan for Vancomycin 1g IV q24h x 8 days -F/U as above  Abran Duke, PharmD Clinical Pharmacist Phone: [phone] Pager: [phone] 01/10/2013 1:33 PM

## 2013-01-10 NOTE — Consult Note (Signed)
CARDIOLOGY CONSULT NOTE  Patient ID: KEERTHANA VANROSSUM MRN: [id number] DOB/AGE: 77-Oct-1914 77 y.o.  Admit date: 01/10/2013 Primary Physician Kimber Relic, MD Primary Cardiologist   Dr. [place] Chief Complaint  Atrial fibrillation  HPI:  The patient has been admitted from her nursing home with suspected pneumonia.  She has a history of atrial fibrillation, MVP with severe MR and is followed by Dr. [place].  We are called after telemetry demonstrated wide complex arrhythmia.  Pro BNP is elevated at 7369.    CXR demonstrated patchy bilateral airspace disease. In the ER she was treated with IV Lasix, NTG paste and antibiotics. She feels better this afternoon after diuresis and atb.  Of note her strep pneumonia urine antigen is positive.  We were called to see her secondary to an asymptomatic run of wide complex tachycardia.   She has had one week of feeling poorly with decreased appetite.  She has had a progressive cough and reports coughing up blood.  She was SOB and the nurse at her retirement home had her come to the hospital.  She has not had new palpitations and she denies any presyncope or syncope.  She doesn't report fevers or chills.  She has had anorexia.  She denies any chest, neck or arm pressure.  She has had some increased leg swelling.    Past Medical History  Diagnosis Date  . HTN (hypertension)   . MVP (mitral valve prolapse)   . Mitral insufficiency     SEVERE  . Chronic anticoagulation   . Edema   . GERD (gastroesophageal reflux disease)   . Personal history of goiter   . Hypothyroidism   . CHF (congestive heart failure)   . Mitral valve prolapse     With severe mitral insufficiency  . Atrial fibrillation   . Goiter     History of goiter    Past Surgical History  Procedure Laterality Date  . Cardioversion  04/14/10  . Appendectomy    . Small intestine surgery      SBO 2000  . Abdominal hysterectomy    . Corneal transplant      MULTIPLE  . Rectal polypectomy        Allergies  Allergen Reactions  . Alphagan (Brimonidine Tartrate)   . Bystolic (Nebivolol Hcl)   . Diovan (Valsartan)   . Other     PRESERVATIVES  . Penicillins   . Polymyxin B-Trimethoprim   . Sulfa Drugs Cross Reactors   . Tekturna (Aliskiren Fumarate)   . Travatan Z    Prescriptions prior to admission  Medication Sig Dispense Refill  . amLODipine (NORVASC) 5 MG tablet Take 1 tablet (5 mg total) by mouth daily.  60 tablet  6  . Artificial Tear Ointment (REFRESH LACRI-LUBE) OINT Apply 1 application to eye at bedtime.      . benazepril (LOTENSIN) 40 MG tablet Take 40 mg by mouth 2 (two) times daily.      Marland Kitchen bismuth subsalicylate (PEPTO BISMOL) 262 MG/15ML suspension Take 15 mLs by mouth as needed for indigestion.       . calcium carbonate (OS-CAL) 600 MG TABS Take 600 mg by mouth daily.      . carboxymethylcellulose (REFRESH PLUS) 0.5 % SOLN Place 1 drop into both eyes as needed (dry eyes).      . Cholecalciferol (VITAMIN D) 2000 UNIT tablet Take 2,000 Units by mouth daily.        . cycloSPORINE (RESTASIS) 0.05 % ophthalmic emulsion Apply 1 drop  to eye 3 (three) times daily.      Marland Kitchen estrogens, conjugated, (PREMARIN) 0.3 MG tablet Take 0.3 mg by mouth See admin instructions. Take daily for 21 days then do not take for 7 days.      . furosemide (LASIX) 20 MG tablet Take 20 mg by mouth See admin instructions. 20mg  daily and may take additional dose for edema if needed      . levothyroxine (SYNTHROID, LEVOTHROID) 25 MCG tablet Take 25 mcg by mouth daily.        . potassium chloride SA (K-DUR,KLOR-CON) 20 MEQ tablet Take 20 mEq by mouth daily.      . prednisoLONE acetate (PRED FORTE) 1 % ophthalmic suspension Apply 1 drop to eye See admin instructions. Right eye three times a day; left eye daily      . timolol (TIMOPTIC) 0.5 % ophthalmic solution Place 1 drop into both eyes 2 (two) times daily.       Marland Kitchen warfarin (COUMADIN) 2.5 MG tablet Take 2.5-5 mg by mouth daily. 2.5mg  Tuesday, Thursday,  Saturday; 5mg  the rest of the week       Family History  Problem Relation Age of Onset  . Stroke Sister   . Hip fracture Sister     History   Social History  . Marital Status: Widowed    Spouse Name: N/A    Number of Children: 1  . Years of Education: N/A   Occupational History  . 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 Smoking status: Never Smoker   . Smokeless tobacco: Not on file  . Alcohol Use: No  . Drug Use: No  . Sexually Active: Not on file   Other Topics Concern  . Not on file   Social History Narrative   Lives at [facility].      ROS:  Fatigue.  s stated in the HPI and negative for all other systems.  Physical Exam: Blood pressure 110/57, pulse 105, temperature 98.1 F (36.7 C), temperature source Oral, resp. rate 22, height 5\' 1"  (1.549 m), weight 99 lb 3.3 oz (45 kg), SpO2 93.00%.  GENERAL:  Frail appearing, thin HEENT:  Pupils equal round and reactive, fundi not visualized, oral mucosa unremarkable NECK:  No jugular venous distention, waveform within normal limits, carotid upstroke brisk and symmetric, no bruits, no thyromegaly LYMPHATICS:  No cervical, inguinal adenopathy LUNGS:  Clear to auscultation bilaterally BACK:  No CVA tenderness CHEST:  Diffuse coarse crackles and wheezing HEART:  PMI not displaced or sustained,S1 and S2 within normal limits, no S3, no clicks, no rubs,apical holosystolic murmur. ABD:  Flat, positive bowel sounds normal in frequency in pitch, no bruits, no rebound, no guarding, no midline pulsatile mass, no hepatomegaly, no splenomegaly EXT:  2 plus pulses throughout, no edema, no cyanosis no clubbing SKIN:  No rashes no nodules NEURO:  Cranial nerves II through XII grossly intact, motor grossly intact throughout PSYCH:  Cognitively intact, oriented to person place and time   Labs: Lab Results  Component Value Date   BUN 17 01/10/2013   Lab Results  Component Value Date   CREATININE 0.52  01/10/2013   Lab Results  Component Value Date   NA 136 01/10/2013   K 3.6 01/10/2013   CL 98 01/10/2013   CO2 22 01/10/2013   No results found for this basename: CKTOTAL,  CKMB,  CKMBINDEX,  TROPONINI   Lab Results  Component Value Date   WBC  6.2 01/10/2013   HGB 14.5 01/10/2013   HCT 43.2 01/10/2013   MCV 91.9 01/10/2013   PLT 144* 01/10/2013   Lab Results  Component Value Date   CHOL 168 08/10/2011   HDL 77.70 08/10/2011   LDLCALC 81 08/10/2011   TRIG 46.0 08/10/2011   CHOLHDL 2 08/10/2011   Lab Results  Component Value Date   ALT 29 01/10/2013   AST 57* 01/10/2013   ALKPHOS 107 01/10/2013   BILITOT 1.5* 01/10/2013     Radiology:CXR:  Cardiomegaly with patchy bilateral airspace disease suggesting pneumonia. Follow-up imaging is recommended to ensure resolution.  EKG:  Atrial fibrillation rate 134 with nonspecific lateral ST depression and PVCs.  01/10/2013  ASSESSMENT AND PLAN:   Atrial fibrillation:  See below.  Continue warfarin.   Wide complex tachycardia:  Probable V Tach.  However, it is somewhat irregular and might be aberrant conduction of fib.  Conservative management however, is indicated.   This might include further med adjustment for rate control but she has been well controlled in the past.  I suspect the rate will be controlled as her acute respiratory illness is resolved  Respiratory failure:  She is being treated with antibiotics.  I agree with diuresis as given.  She certainly has some element of heart failure.  We can further assess her volume status in the AM to consider further IV vs. PO Lasix  MR:  Severe.  Conservative management.  No further imaging is needed.   HTN:  For now her pervious meds (Norvasc and Lotensin) have been ordered.  SignedRollene Rotunda 01/10/2013, 5:39 PM

## 2013-01-10 NOTE — Progress Notes (Addendum)
Nurse contacted MD once pt arrived on floor to inform that pt had experience periodic runs of VTACH and conversions back to A fib (pt remained asymptomatic during episodes) at approximately 16:39.12 and 16:39.44.  MD informed nurse that he would consult Cardiology and order specific labs.  Nurse will continue to monitor.

## 2013-01-10 NOTE — Progress Notes (Addendum)
ANTICOAGULATION  CONSULT NOTE-  Coumadin  Indication :  H/o atrial fibrillation;  On coumadin PTA  ANTIBIOTIC CONSULT NOTE - follow up and  ANTIBIOTICS renal dose adjustment Pharmacy Consult for Vancomycin x 8 days Indication: suspected pneumonia  Allergies  Allergen Reactions  . Alphagan (Brimonidine Tartrate)   . Bystolic (Nebivolol Hcl)   . Diovan (Valsartan)   . Other     PRESERVATIVES  . Penicillins   . Polymyxin B-Trimethoprim   . Sulfa Drugs Cross Reactors   . Tekturna (Aliskiren Fumarate)   . Travatan Z     Patient Measurements: Height: 5\' 1"  (154.9 cm) Weight: 99 lb 3.3 oz (45 kg) IBW/kg (Calculated) : 47.8   Vital Signs: Temp: 98.1 F (36.7 C) (05/23 1622) Temp src: Oral (05/23 1622) BP: 110/57 mmHg (05/23 1700) Pulse Rate: 105 (05/23 1700) Intake/Output from previous day:   Intake/Output from this shift: Total I/O In: 50 [I.V.:50] Out: 700 [Urine:700]  Labs:  Recent Labs  01/10/13 1212  WBC 6.2  HGB 14.5  PLT 144*  CREATININE 0.52   Estimated Creatinine Clearance: 27.9 ml/min (by C-G formula based on Cr of 0.52). No results found for this basename: VANCOTROUGH, VANCOPEAK, VANCORANDOM, GENTTROUGH, GENTPEAK, GENTRANDOM, TOBRATROUGH, TOBRAPEAK, TOBRARND, AMIKACINPEAK, AMIKACINTROU, AMIKACIN,  in the last 72 hours   Microbiology: No results found for this or any previous visit (from the past 720 hour(s)).  Medical History: Past Medical History  Diagnosis Date  . HTN (hypertension)   . MVP (mitral valve prolapse)   . Mitral insufficiency     SEVERE  . Chronic anticoagulation   . Edema   . GERD (gastroesophageal reflux disease)   . Personal history of goiter   . Hypothyroidism   . CHF (congestive heart failure)   . Mitral valve prolapse     With severe mitral insufficiency  . Atrial fibrillation   . Goiter     History of goiter   Assessment:  Rose Weber is a 79yo F who is to resume her coumadin tonight per pharmacy protocol.  Her PTA  dosage regimen at the assisted living facility was coumadin 5mg  daily except on TTSat takes 2.5mg . The INR today is 2.16 which is therapeutic on the current dosage.    SCr = 0.52, estimated CrCl ~ 28 ml/min,  Wt = 45 kg,  Age 79 y.o female  On vancomycin IV for suspected HCAP.   Goal of Therapy:  INR = 2-3 Vancomycin trough level 15-20 mcg/ml  Plan:  Adjust vancomycin dose to 750mg  IV q24h for a total of 8 days - Will monitor cx/spec/sens, renal fn and clinical status daily. - Will check Css trough as appropriate. Give coumadin 5mg  tonight  (per usual PTA dosage regimen) Daily PT/INR    Noah Delaine, RPh Clinical Pharmacist Pager: [phone] 01/10/2013 5:55 PM  ADDENDUM: ANTIBIOTICS renal dose adjustment  On Cefepime 1g IV q24h and aztrenam 2g IV q8h.   79 y.o female, TBW = 45 kg, IBW = 47.8 kg,  SCr = 0.52, CrCl ~ 28 ml/min.   PLAN:  Change aztreonam to 1g IV q8h. Cefepime will be discontinued per MD verbal order due to h/o PCN allergy  Noah Delaine, RPh Clinical Pharmacist Pager: [phone] 01/10/2013  6:16 PM

## 2013-01-10 NOTE — H&P (Signed)
[facility] History and Physical  Rose Weber ZOX:[id number] DOB: Sep 23, 1912 DOA: 01/10/2013  Referring physician: Dr. Jeraldine Loots PCP: Kimber Relic, MD   Chief Complaint:  San from friend's home with shortness of breath and fever  HPI:  79 year old female with history of hypertension, mitral regurgitation, A. fib on Coumadin, history of CHF , hypothyroidism with history of goiter, legally blind who is a resident of friend's home was sent to the ED with shortness of breath with increased work of breathing, tachypnea and hypoxia this morning. As per patient she was having cough with shortness of breath for last 4 days. She denies having fever or chills, nausea, vomiting, chest pain, palpitations, orthopnea or PND. She denies having abdominal pain, bowel or urinary symptoms. She does inform poor appetite since 4 days. Patient is able to ambulate with some support.  Course in the ED Patient was noted to be in respiratory distress with O2 sat in 70s, tachypneic and tachycardic. She was placed on BiPAP. Patient had the temperature 101.65F . Blood work done and showed a normal CBC and BMET. A chest x-ray done showed cardiomegaly with patchy bilateral airspace disease suggestive of pneumonia. Patient given a bolus of IV vancomycin and Aztreonam. She was then transitioned to nasal cannula 3 L and maintain her sats in low 90s. Hospitalists called for admission to step down.    Review of Systems:  Constitutional:  fever, chills, diaphoresis, appetite change and fatigue.  HEENT: Impaired vision, difficulty swallowing, Denies photophobia, eye pain, redness, hearing loss, ear pain, congestion, sore throat, rhinorrhea, sneezing, mouth sores,  neck pain, neck stiffness and tinnitus.   Respiratory:  SOB, DOE, cough, denies chest tightness,  and wheezing.   Cardiovascular: Denies chest pain, palpitations , has leg swelling  Gastrointestinal: Denies nausea, vomiting, abdominal pain, diarrhea,  constipation, blood in stool and abdominal distention.  Genitourinary: Denies dysuria, urgency, frequency, hematuria, flank pain and difficulty urinating.  Musculoskeletal: Denies myalgias, back pain, joint swelling, arthralgias and gait problem.  Skin: Denies pallor, rash and wound.  Neurological: Denies dizziness, seizures, syncope, weakness, light-headedness, numbness and headaches.  Hematological: Easy bruising, Denies adenopathy. , personal or family bleeding history  Psychiatric/Behavioral: Denies  mood changes, confusion, nervousness, sleep disturbance and agitation   Past Medical History  Diagnosis Date  . HTN (hypertension)   . MVP (mitral valve prolapse)   . Mitral insufficiency     SEVERE  . Chronic anticoagulation   . Edema   . GERD (gastroesophageal reflux disease)   . Personal history of goiter   . Hypothyroidism   . CHF (congestive heart failure)   . Mitral valve prolapse     With severe mitral insufficiency  . Atrial fibrillation   . Goiter     History of goiter   Past Surgical History  Procedure Laterality Date  . Cardioversion  04/14/10  . Appendectomy    . Small intestine surgery      SBO 2000  . Abdominal hysterectomy    . Corneal transplant      MULTIPLE  . Rectal polypectomy     Social History:  reports that she has never smoked. She does not have any smokeless tobacco history on file. She reports that she does not drink alcohol or use illicit drugs.  Allergies  Allergen Reactions  . Alphagan (Brimonidine Tartrate)   . Bystolic (Nebivolol Hcl)   . Diovan (Valsartan)   . Other     PRESERVATIVES  . Penicillins   . Polymyxin B-Trimethoprim   .  Sulfa Drugs Cross Reactors   . Tekturna (Aliskiren Fumarate)   . Travatan Z     Family History  Problem Relation Age of Onset  . Stroke Sister   . Hip fracture Sister     Prior to Admission medications   Medication Sig Start Date End Date Taking? Authorizing Provider  amLODipine (NORVASC) 5 MG  tablet Take 1 tablet (5 mg total) by mouth daily. 08/02/12  Yes Peter M [place], MD  Artificial Tear Ointment (REFRESH LACRI-LUBE) OINT Apply 1 application to eye at bedtime.   Yes Historical Provider, MD  benazepril (LOTENSIN) 40 MG tablet Take 40 mg by mouth 2 (two) times daily.   Yes Historical Provider, MD  bismuth subsalicylate (PEPTO BISMOL) 262 MG/15ML suspension Take 15 mLs by mouth as needed for indigestion.    Yes Historical Provider, MD  calcium carbonate (OS-CAL) 600 MG TABS Take 600 mg by mouth daily.   Yes Historical Provider, MD  carboxymethylcellulose (REFRESH PLUS) 0.5 % SOLN Place 1 drop into both eyes as needed (dry eyes).   Yes Historical Provider, MD  Cholecalciferol (VITAMIN D) 2000 UNIT tablet Take 2,000 Units by mouth daily.     Yes Historical Provider, MD  cycloSPORINE (RESTASIS) 0.05 % ophthalmic emulsion Apply 1 drop to eye 3 (three) times daily.   Yes Historical Provider, MD  estrogens, conjugated, (PREMARIN) 0.3 MG tablet Take 0.3 mg by mouth See admin instructions. Take daily for 21 days then do not take for 7 days.   Yes Historical Provider, MD  furosemide (LASIX) 20 MG tablet Take 20 mg by mouth See admin instructions. 20mg  daily and may take additional dose for edema if needed   Yes Historical Provider, MD  levothyroxine (SYNTHROID, LEVOTHROID) 25 MCG tablet Take 25 mcg by mouth daily.     Yes Historical Provider, MD  potassium chloride SA (K-DUR,KLOR-CON) 20 MEQ tablet Take 20 mEq by mouth daily.   Yes Historical Provider, MD  prednisoLONE acetate (PRED FORTE) 1 % ophthalmic suspension Apply 1 drop to eye See admin instructions. Right eye three times a day; left eye daily   Yes Historical Provider, MD  timolol (TIMOPTIC) 0.5 % ophthalmic solution Place 1 drop into both eyes 2 (two) times daily.    Yes Historical Provider, MD  warfarin (COUMADIN) 2.5 MG tablet Take 2.5-5 mg by mouth daily. 2.5mg  Tuesday, Thursday, Saturday; 5mg  the rest of the week   Yes Historical  Provider, MD    Physical Exam:  Filed Vitals:   01/10/13 1253 01/10/13 1318 01/10/13 1338 01/10/13 1444  BP: 162/71   117/64  Pulse: 127   106  Temp:    98.1 F (36.7 C)  TempSrc:    Oral  Resp: 36   21  SpO2: 98% 89% 99% 91%    Constitutional: Vital signs reviewed.  Patient is an elderly thin built female lying in bed with some respiratory discomfort. Using accessory muscles of respiration. HEENT: No pallor, moist oral mucosa, no JVD, neck supple Cardiovascular: S1 and S2 irregular,  no MRG, pulses symmetric and intact bilaterally Pulmonary/Chest: Bibasilar crackles with bilateral scattered rhonchi, Abdominal: Soft. Non-tender, non-distended, bowel sounds are normal, no masses, organomegaly, or guarding present.  GU: no CVA tenderness Musculoskeletal: No joint deformities, erythema, or stiffness, ROM full and no nontender Ext: Trace edema bilaterally, no cyanosis, pulses palpable bilaterally  Hematology: no cervical adenopathy.  Neurological: A&O x3, Strenght is normal and symmetric bilaterally, cranial nerve II-XII are grossly intact, no focal motor deficit, sensory intact  to light touch bilaterally.  Skin: Warm, dry and intact. No rash, cyanosis, or clubbing.  Psychiatric: Normal mood and affect. speech and behavior is normal. Judgment and thought content normal. Cognition and memory are normal.   Labs on Admission:  Basic Metabolic Panel:  Recent Labs Lab 01/10/13 1212  NA 136  K 3.6  CL 98  CO2 22  GLUCOSE 145*  BUN 17  CREATININE 0.52  CALCIUM 8.8   Liver Function Tests:  Recent Labs Lab 01/10/13 1212  AST 57*  ALT 29  ALKPHOS 107  BILITOT 1.5*  PROT 6.9  ALBUMIN 3.4*   No results found for this basename: LIPASE, AMYLASE,  in the last 168 hours No results found for this basename: AMMONIA,  in the last 168 hours CBC:  Recent Labs Lab 01/10/13 1212  WBC 6.2  NEUTROABS 4.8  HGB 14.5  HCT 43.2  MCV 91.9  PLT 144*   Cardiac Enzymes: No results  found for this basename: CKTOTAL, CKMB, CKMBINDEX, TROPONINI,  in the last 168 hours BNP: No components found with this basename: POCBNP,  CBG: No results found for this basename: GLUCAP,  in the last 168 hours  Radiological Exams on Admission: Dg Chest Portable 1 View  01/10/2013   *RADIOLOGY REPORT*  Clinical Data: Respiratory distress  PORTABLE CHEST - 1 VIEW  Comparison: 03/03/2010  Findings: Hyperexpansion is consistent with emphysema.  Patchy airspace disease is seen in the left upper lung and in the right base, suggesting pneumonia. The cardiopericardial silhouette is enlarged. Bones are diffusely demineralized. Telemetry leads overlie the chest.  IMPRESSION: Cardiomegaly with patchy bilateral airspace disease suggesting pneumonia.  Follow-up imaging is recommended to ensure resolution.   Original Report Authenticated By: Kennith Center, M.D.    EKG: AFib with RVR @134  BPM, frequent bigeminy  Assessment/Plan  Principal Problem:   Respiratory failure, acute Secondary to healthcare associated pneumonia and underlying mild CHF exacerbation Patient given IV vancomycin and ask anatomy the ED. Will switch to IV vancomycin and cefepime. Follow blood culture, sputum culture strep pneumoniae antigen in the region of the antigen Patient maintaining sats on O2 via nasal cannula at 3 L per minute. She has active wheezing and rhonchi. Will order albuterol nebs. -Diurese with IV lasix mg twice a day  Active Problems:   Healthcare-associated pneumonia As outlined above  CHF As outlined above Monitor I/O. and daily weight    HTN (hypertension) Resume home BP meds     MVP (mitral valve prolapse) As the associated severe mitral regurgitation. Follows with Dr. Thomasene Lot with [facility]     Atrial fibrillation Patient injected A. fib on presentation. Marland Kitchen Resume Coumadin. INR therapeutic. Dosing per pharmacy. IV lopressor prn     Hypothyroidism  Continue Synthroid Check  TSH  Vtach Patient noted to have sustained v tach shortly after arriving to teh floor. Low k noted. Replenished with 40 meq kc.check mg level. [facility] consulted.    DVT prophylaxis  Code Status: DO NOT RESUSCITATE Family Communication: None at bedside Disposition Plan: Return to SNF once stable  Rose Weber [facility] Pager :[phone]  If 7PM-7AM, please contact night-coverage [email or web address] Password [facility] 01/10/2013, 3:52 PM  Total time spent: 70 minutes

## 2013-01-10 NOTE — ED Notes (Signed)
MD at bedside. 

## 2013-01-11 ENCOUNTER — Inpatient Hospital Stay (HOSPITAL_COMMUNITY): Payer: Medicare Other

## 2013-01-11 LAB — CBC WITH DIFFERENTIAL/PLATELET
Basophils Absolute: 0.1 10*3/uL (ref 0.0–0.1)
Eosinophils Relative: 0 % (ref 0–5)
Lymphs Abs: 0.9 10*3/uL (ref 0.7–4.0)
MCH: 31.6 pg (ref 26.0–34.0)
MCV: 89.5 fL (ref 78.0–100.0)
Monocytes Absolute: 0.5 10*3/uL (ref 0.1–1.0)
Platelets: 138 10*3/uL — ABNORMAL LOW (ref 150–400)
RDW: 14.6 % (ref 11.5–15.5)

## 2013-01-11 LAB — BASIC METABOLIC PANEL
CO2: 25 mEq/L (ref 19–32)
Calcium: 8.2 mg/dL — ABNORMAL LOW (ref 8.4–10.5)
Creatinine, Ser: 0.53 mg/dL (ref 0.50–1.10)
Glucose, Bld: 154 mg/dL — ABNORMAL HIGH (ref 70–99)

## 2013-01-11 LAB — LEGIONELLA ANTIGEN, URINE

## 2013-01-11 MED ORDER — CALCIUM CARBONATE 1250 (500 CA) MG PO TABS
1.0000 | ORAL_TABLET | Freq: Every day | ORAL | Status: DC
  Administered 2013-01-11 – 2013-01-15 (×5): 500 mg via ORAL
  Filled 2013-01-11 (×4): qty 1

## 2013-01-11 MED ORDER — WARFARIN SODIUM 1 MG PO TABS
1.0000 mg | ORAL_TABLET | Freq: Once | ORAL | Status: AC
  Administered 2013-01-11: 1 mg via ORAL
  Filled 2013-01-11: qty 1

## 2013-01-11 MED ORDER — LEVOFLOXACIN IN D5W 750 MG/150ML IV SOLN
750.0000 mg | INTRAVENOUS | Status: DC
  Administered 2013-01-11 – 2013-01-13 (×2): 750 mg via INTRAVENOUS
  Filled 2013-01-11 (×2): qty 150

## 2013-01-11 NOTE — Progress Notes (Addendum)
ANTICOAGULATION  CONSULT NOTE-  Coumadin  Indication :  H/o atrial fibrillation;  On coumadin PTA  ANTIBIOTIC CONSULT NOTE - follow up and  ANTIBIOTICS renal dose adjustment Pharmacy Consult for Vancomycin x 8 days Indication: suspected pneumonia   Patient Measurements: Height: 5\' 1"  (154.9 cm) Weight: 99 lb 3.3 oz (45 kg) IBW/kg (Calculated) : 47.8   Vital Signs: Temp: 97.4 F (36.3 C) (05/24 0436) Temp src: Oral (05/24 0436) BP: 125/69 mmHg (05/24 0800) Pulse Rate: 52 (05/24 0800) Intake/Output from previous day: 05/23 0701 - 05/24 0700 In: 320 [P.O.:120; I.V.:100; IV Piggyback:100] Out: 2200 [Urine:2200] Intake/Output from this shift:    Labs:  INR/Prothrombin Time = 2.84   Recent Labs  01/10/13 1212 01/11/13 0640  WBC 6.2 7.8  HGB 14.5 13.8  PLT 144* 138*  CREATININE 0.52 0.53   Estimated Creatinine Clearance: 27.9 ml/min (by C-G formula based on Cr of 0.53). No results found for this basename: VANCOTROUGH, Leodis Binet, VANCORANDOM, GENTTROUGH, GENTPEAK, GENTRANDOM, TOBRATROUGH, TOBRAPEAK, TOBRARND, AMIKACINPEAK, AMIKACINTROU, AMIKACIN,  in the last 72 hours   Microbiology: Recent Results (from the past 720 hour(s))  MRSA PCR SCREENING     Status: None   Collection Time    01/10/13  5:10 PM      Result Value Range Status   MRSA by PCR NEGATIVE  NEGATIVE Final   Comment:            The GeneXpert MRSA Assay (FDA     approved for NASAL specimens     only), is one component of a     comprehensive MRSA colonization     surveillance program. It is not     intended to diagnose MRSA     infection nor to guide or     monitor treatment for     MRSA infections.   Assessment:  Rose Weber is a 79yo F who continues on warfarin while hospitalized for AFIB.  Her PTA dosage regimen at the assisted living facility was coumadin 5mg  daily except on TTSat takes 2.5mg . The INR today is 2.84 which is therapeutic and rising  SCr = 0.53, estimated CrCl ~ 28 ml/min,  Wt = 45  kg,  Age 79 y.o female  On vancomycin IV and aztreonam for suspected HCAP.  STREPTOCOCCUS PNEUMONIAE URINARY ANTIGEN is POSITIVE  Goal of Therapy:  INR = 2-3 Vancomycin trough level 15-20 mcg/ml  Plan:  Continue vancomycin and aztreonam at current doses.   Give coumadin 1mg  tonight  Daily PT/INR   Celedonio Miyamoto, PharmD, BCPS Clinical Pharmacist Pager [phone]   Pharmacy has received orders to change vanc/aztreonam to levaquin.  Plan: 1) Levaquin 750mg  IV q48 2) Continue to follow renal function  Louie Casa, PharmD, BCPS 01/11/2013, 7:10PM

## 2013-01-11 NOTE — Progress Notes (Signed)
[facility] Progress Note [place] TEAM 1 - Stepdown/ICU TEAM   Rose Weber:[id number] DOB: 1913-02-06 DOA: 01/10/2013 PCP: Kimber Relic, MD  Brief narrative: 79 year old female with history of hypertension, mitral regurgitation, A. fib on Coumadin, history of CHF , hypothyroidism with history of goiter, legally blind who is a resident of friend's home was sent to the ED with shortness of breath with increased work of breathing, tachypnea and hypoxia and cough. She has had a cough for about 1 wk. She was found to have multifocal pneumonia.    Assessment/Plan: Healthcare-associated pneumonia  Started on Vanc and Cefepime and Azactam - strep pneumo antigen positive - d/c vanc and cefepime and change to Levaquin  CHF  Cont Lasix daily for now -negative balance by 2400 cc - cont to monitor I/O. and daily weight   HTN (hypertension)  Resume home BP meds   MVP (mitral valve prolapse)  As the associated severe mitral regurgitation. Follows with Dr. Thomasene Lot with [facility]   Atrial fibrillation  Patient injected A. fib on presentation.  Cont  Coumadin. INR therapeutic.  Dosing per pharmacy.  IV lopressor prn   Hypothyroidism  Continue Synthroid  TSH normal  Vtach  Patient noted to have sustained v tach shortly after arriving to the floor.  Low k noted. Replenished with 40 meq . [facility] cardiology consulted- no further treatment recommended.     Code Status: DNR Family Communication: none Disposition Plan: cont to follow in SDU  Consultants: Cardio  Procedures: none  Antibiotics: VAnc/ Zosyn  DVT prophylaxis: Coumadin  HPI/Subjective: Pt alert and is feeling less short of breath than yesterday. No chest pain. Moderate cough.    Objective: Blood pressure 102/63, pulse 72, temperature 97.8 F (36.6 C), temperature source Oral, resp. rate 19, height 5\' 1"  (1.549 m), weight 45 kg (99 lb 3.3 oz), SpO2 95.00%.  Intake/Output Summary (Last 24  hours) at 01/11/13 1829 Last data filed at 01/11/13 1230  Gross per 24 hour  Intake    220 ml  Output   1350 ml  Net  -1130 ml     Exam: General: No acute respiratory distress Lungs: Clear to auscultation bilaterally without wheezes or crackles Cardiovascular: Regular rate and rhythm without murmur gallop or rub normal S1 and S2 Abdomen: Nontender, nondistended, soft, bowel sounds positive, no rebound, no ascites, no appreciable mass Extremities: No significant cyanosis, clubbing, or edema bilateral lower extremities  Data Reviewed: Basic Metabolic Panel:  Recent Labs Lab 01/10/13 1212 01/10/13 1213 01/11/13 0640  NA 136  --  136  K 3.6  --  3.6  CL 98  --  101  CO2 22  --  25  GLUCOSE 145*  --  154*  BUN 17  --  16  CREATININE 0.52  --  0.53  CALCIUM 8.8  --  8.2*  MG  --  1.8  --    Liver Function Tests:  Recent Labs Lab 01/10/13 1212  AST 57*  ALT 29  ALKPHOS 107  BILITOT 1.5*  PROT 6.9  ALBUMIN 3.4*   No results found for this basename: LIPASE, AMYLASE,  in the last 168 hours No results found for this basename: AMMONIA,  in the last 168 hours CBC:  Recent Labs Lab 01/10/13 1212 01/11/13 0640  WBC 6.2 7.8  NEUTROABS 4.8 6.3  HGB 14.5 13.8  HCT 43.2 39.1  MCV 91.9 89.5  PLT 144* 138*   Cardiac Enzymes: No results found for this basename: CKTOTAL,  CKMB, CKMBINDEX, TROPONINI,  in the last 168 hours BNP (last 3 results)  Recent Labs  01/10/13 1213  PROBNP 7369.0*   CBG: No results found for this basename: GLUCAP,  in the last 168 hours  Recent Results (from the past 240 hour(s))  MRSA PCR SCREENING     Status: None   Collection Time    01/10/13  5:10 PM      Result Value Range Status   MRSA by PCR NEGATIVE  NEGATIVE Final   Comment:            The GeneXpert MRSA Assay (FDA     approved for NASAL specimens     only), is one component of a     comprehensive MRSA colonization     surveillance program. It is not     intended to diagnose  MRSA     infection nor to guide or     monitor treatment for     MRSA infections.     Studies:  Recent x-ray studies have been reviewed in detail by the Attending Physician  Scheduled Meds:  Scheduled Meds: . albuterol  2.5 mg Nebulization Q6H  . amLODipine  5 mg Oral Daily  . antiseptic oral rinse  15 mL Mouth Rinse BID  . artificial tears   Both Eyes QHS  . aztreonam  1 g Intravenous Q8H  . benazepril  40 mg Oral BID  . calcium carbonate  1 tablet Oral Daily  . cholecalciferol  2,000 Units Oral Daily  . cycloSPORINE  1 drop Both Eyes TID  . furosemide  40 mg Intravenous Daily  . levothyroxine  25 mcg Oral QAC breakfast  . potassium chloride SA  20 mEq Oral Daily  . prednisoLONE acetate  1 drop Right Eye TID   And  . prednisoLONE acetate  1 drop Left Eye Daily  . timolol  1 drop Both Eyes BID  . vancomycin  750 mg Intravenous Q24H  . warfarin  1 mg Oral ONCE-1800  . Warfarin - Pharmacist Dosing Inpatient   Does not apply q1800   Continuous Infusions:   Time spent on care of this patient: 35 min   Calvert Cantor, MD [phone]  [facility] Office  [phone] Pager - Text Page per Amion as per below:  On-Call/Text Page:      [email or web address]      password [email or web address]  If 7PM-7AM, please contact night-coverage [email or web address] Password [facility] 01/11/2013, 6:29 PM   LOS: 1 day

## 2013-01-11 NOTE — Evaluation (Signed)
Clinical/Bedside Swallow Evaluation Patient Details  Name: Rose Weber MRN: [id number] Date of Birth: [birth year]  Today's Date: 01/11/2013 Time: 1200-1230 SLP Time Calculation (min): 30 min  Past Medical History:  Past Medical History  Diagnosis Date  . HTN (hypertension)   . MVP (mitral valve prolapse)   . Mitral insufficiency     SEVERE  . Chronic anticoagulation   . Edema   . GERD (gastroesophageal reflux disease)   . Personal history of goiter   . Hypothyroidism   . CHF (congestive heart failure)   . Mitral valve prolapse     With severe mitral insufficiency  . Atrial fibrillation   . Goiter     History of goiter   Past Surgical History:  Past Surgical History  Procedure Laterality Date  . Cardioversion  04/14/10  . Appendectomy    . Small intestine surgery      SBO 2000  . Abdominal hysterectomy    . Corneal transplant      MULTIPLE  . Rectal polypectomy     HPI:    79 year old female with history of hypertension, mitral regurgitation, A. fib on Coumadin, history of CHF , hypothyroidism with history of goiter, legally blind who is a resident of friend's home was sent to the ED with shortness of breath with increased work of breathing, tachypnea and hypoxia this morning. As per patient she was having cough with shortness of breath for last 4 days. She denies having fever or chills, nausea, vomiting, chest pain, palpitations, orthopnea or PND. She denies having abdominal pain, bowel or urinary symptoms. She does inform poor appetite since 4 days. Patient is able to ambulate with some support.  CXR: Cardiomegaly with patchy bilateral airspace disease suggesting pneumonia.  Follow-up imaging recommended to ensure resolution.  BSE indicated to assess risk for aspiration.       Assessment / Plan / Recommendation Clinical Impression  No outward s/s of aspiration noted throughout evaluation. Vocal quality clear with no change in vital signs with PO trials of regular  consistency solids and thin liquid.   Swallow audible with trials of thin liquid by cup indicating possible structural dysphagia.  Reduced hyoid laryngeal elevation noted by palpation.  Continue current diet consistency but  recommend aspiration precautions due to current respiratory status.  ST to follow briefly in acute care setting for diet tolerance due to current respiratory status and history of PNA.  Cannot rule out silent aspiration with subjective assessment.  Completion of objective evaluation of MBS to be determined.      Aspiration Risk  Moderate    Diet Recommendation Regular;Thin liquid   Liquid Administration via: Cup;No straw Medication Administration: Crushed with puree Supervision: Patient able to self feed;Intermittent supervision to cue for compensatory strategies Compensations: Slow rate;Small sips/bites;Clear throat intermittently Postural Changes and/or Swallow Maneuvers: Seated upright 90 degrees;Upright 30-60 min after meal    Other  Recommendations Oral Care Recommendations: Oral care BID   Follow Up Recommendations  Skilled Nursing facility    Frequency and Duration min 1 x/week  1 week       SLP Swallow Goals Patient will utilize recommended strategies during swallow to increase swallowing safety with: Modified independent assistance   Swallow Study Prior Functional Status   ALF on regular consistency diet with thin liquids    General Date of Onset: 01/11/13 Type of Study: Bedside swallow evaluation Diet Prior to this Study: Regular;Thin liquids Temperature Spikes Noted: No Respiratory Status: Supplemental O2 delivered via (comment) (nasal cannula)  History of Recent Intubation: No Behavior/Cognition: Alert;Cooperative;Pleasant mood Oral Cavity - Dentition: Adequate natural dentition Self-Feeding Abilities: Able to feed self;Needs assist Patient Positioning: Upright in bed Baseline Vocal Quality: Clear Volitional Cough: Strong Volitional Swallow:  Able to elicit    Oral/Motor/Sensory Function Overall Oral Motor/Sensory Function: Appears within functional limits for tasks assessed   Ice Chips Ice chips: Not tested   Thin Liquid Thin Liquid: Impaired Presentation: Cup Pharyngeal  Phase Impairments: Suspected delayed Swallow Other Comments: loud, audible swallow     Nectar Thick Nectar Thick Liquid: Not tested   Honey Thick Honey Thick Liquid: Not tested   Puree Puree: Within functional limits Presentation: Spoon   Solid   GO    Solid: Within functional limits Presentation: Self Lorretta Harp MS, CCC-SLP [phone] [facility] 01/11/2013,2:29 PM

## 2013-01-11 NOTE — Progress Notes (Signed)
SUBJECTIVE:  Breathing better.  No pain   PHYSICAL EXAM Filed Vitals:   01/11/13 0600 01/11/13 0800 01/11/13 0807 01/11/13 1000  BP: 109/69 125/69  102/59  Pulse: 125 52  79  Temp:      TempSrc:      Resp: 19 22  22   Height:      Weight:      SpO2: 92% 95% 96% 93%   General:  No distress Lungs:  Diffuse coarse crackles Heart: Irregular Abdomen: Positive bowel sounds, no rebound no guarding Extremities:  No edema.  LABS: No results found for this basename: CKTOTAL, CKMB, CKMBINDEX, TROPONINI   Results for orders placed during the hospital encounter of 01/10/13 (from the past 24 hour(s))  CBC WITH DIFFERENTIAL     Status: Abnormal   Collection Time    01/10/13 12:12 PM      Result Value Range   WBC 6.2  4.0 - 10.5 K/uL   RBC 4.70  3.87 - 5.11 MIL/uL   Hemoglobin 14.5  12.0 - 15.0 g/dL   HCT 16.1  09.6 - 04.5 %   MCV 91.9  78.0 - 100.0 fL   MCH 30.9  26.0 - 34.0 pg   MCHC 33.6  30.0 - 36.0 g/dL   RDW 40.9  81.1 - 91.4 %   Platelets 144 (*) 150 - 400 K/uL   Neutrophils Relative % 77  43 - 77 %   Lymphocytes Relative 12  12 - 46 %   Monocytes Relative 10  3 - 12 %   Eosinophils Relative 0  0 - 5 %   Basophils Relative 1  0 - 1 %   Neutro Abs 4.8  1.7 - 7.7 K/uL   Lymphs Abs 0.7  0.7 - 4.0 K/uL   Monocytes Absolute 0.6  0.1 - 1.0 K/uL   Eosinophils Absolute 0.0  0.0 - 0.7 K/uL   Basophils Absolute 0.1  0.0 - 0.1 K/uL  COMPREHENSIVE METABOLIC PANEL     Status: Abnormal   Collection Time    01/10/13 12:12 PM      Result Value Range   Sodium 136  135 - 145 mEq/L   Potassium 3.6  3.5 - 5.1 mEq/L   Chloride 98  96 - 112 mEq/L   CO2 22  19 - 32 mEq/L   Glucose, Bld 145 (*) 70 - 99 mg/dL   BUN 17  6 - 23 mg/dL   Creatinine, Ser 7.82  0.50 - 1.10 mg/dL   Calcium 8.8  8.4 - 95.6 mg/dL   Total Protein 6.9  6.0 - 8.3 g/dL   Albumin 3.4 (*) 3.5 - 5.2 g/dL   AST 57 (*) 0 - 37 U/L   ALT 29  0 - 35 U/L   Alkaline Phosphatase 107  39 - 117 U/L   Total Bilirubin 1.5 (*)  0.3 - 1.2 mg/dL   GFR calc non Af Amer 77 (*) >90 mL/min   GFR calc Af Amer 89 (*) >90 mL/min  PRO B NATRIURETIC PEPTIDE     Status: Abnormal   Collection Time    01/10/13 12:13 PM      Result Value Range   Pro B Natriuretic peptide (BNP) 7369.0 (*) 0 - 450 pg/mL  MAGNESIUM     Status: None   Collection Time    01/10/13 12:13 PM      Result Value Range   Magnesium 1.8  1.5 - 2.5 mg/dL  PROTIME-INR  Status: Abnormal   Collection Time    01/10/13  1:40 PM      Result Value Range   Prothrombin Time 23.2 (*) 11.6 - 15.2 seconds   INR 2.16 (*) 0.00 - 1.49  URINALYSIS, ROUTINE W REFLEX MICROSCOPIC     Status: Abnormal   Collection Time    01/10/13  1:50 PM      Result Value Range   Color, Urine YELLOW  YELLOW   APPearance CLEAR  CLEAR   Specific Gravity, Urine 1.011  1.005 - 1.030   pH 5.5  5.0 - 8.0   Glucose, UA NEGATIVE  NEGATIVE mg/dL   Hgb urine dipstick NEGATIVE  NEGATIVE   Bilirubin Urine NEGATIVE  NEGATIVE   Ketones, ur 15 (*) NEGATIVE mg/dL   Protein, ur NEGATIVE  NEGATIVE mg/dL   Urobilinogen, UA 0.2  0.0 - 1.0 mg/dL   Nitrite NEGATIVE  NEGATIVE   Leukocytes, UA NEGATIVE  NEGATIVE  STREP PNEUMONIAE URINARY ANTIGEN     Status: Abnormal   Collection Time    01/10/13  1:50 PM      Result Value Range   Strep Pneumo Urinary Antigen POSITIVE (*) NEGATIVE  TSH     Status: None   Collection Time    01/10/13  3:57 PM      Result Value Range   TSH 1.034  0.350 - 4.500 uIU/mL  HIV ANTIBODY (ROUTINE TESTING)     Status: None   Collection Time    01/10/13  4:45 PM      Result Value Range   HIV NON REACTIVE  NON REACTIVE  MRSA PCR SCREENING     Status: None   Collection Time    01/10/13  5:10 PM      Result Value Range   MRSA by PCR NEGATIVE  NEGATIVE  BASIC METABOLIC PANEL     Status: Abnormal   Collection Time    01/11/13  6:40 AM      Result Value Range   Sodium 136  135 - 145 mEq/L   Potassium 3.6  3.5 - 5.1 mEq/L   Chloride 101  96 - 112 mEq/L   CO2 25  19  - 32 mEq/L   Glucose, Bld 154 (*) 70 - 99 mg/dL   BUN 16  6 - 23 mg/dL   Creatinine, Ser 9.52  0.50 - 1.10 mg/dL   Calcium 8.2 (*) 8.4 - 10.5 mg/dL   GFR calc non Af Amer 76 (*) >90 mL/min   GFR calc Af Amer 89 (*) >90 mL/min  CBC WITH DIFFERENTIAL     Status: Abnormal   Collection Time    01/11/13  6:40 AM      Result Value Range   WBC 7.8  4.0 - 10.5 K/uL   RBC 4.37  3.87 - 5.11 MIL/uL   Hemoglobin 13.8  12.0 - 15.0 g/dL   HCT 84.1  32.4 - 40.1 %   MCV 89.5  78.0 - 100.0 fL   MCH 31.6  26.0 - 34.0 pg   MCHC 35.3  30.0 - 36.0 g/dL   RDW 02.7  25.3 - 66.4 %   Platelets 138 (*) 150 - 400 K/uL   Neutrophils Relative % 81 (*) 43 - 77 %   Lymphocytes Relative 12  12 - 46 %   Monocytes Relative 6  3 - 12 %   Eosinophils Relative 0  0 - 5 %   Basophils Relative 1  0 - 1 %  Neutro Abs 6.3  1.7 - 7.7 K/uL   Lymphs Abs 0.9  0.7 - 4.0 K/uL   Monocytes Absolute 0.5  0.1 - 1.0 K/uL   Eosinophils Absolute 0.0  0.0 - 0.7 K/uL   Basophils Absolute 0.1  0.0 - 0.1 K/uL   WBC Morphology ATYPICAL LYMPHOCYTES    PROTIME-INR     Status: Abnormal   Collection Time    01/11/13  6:40 AM      Result Value Range   Prothrombin Time 28.4 (*) 11.6 - 15.2 seconds   INR 2.84 (*) 0.00 - 1.49    Intake/Output Summary (Last 24 hours) at 01/11/13 1208 Last data filed at 01/11/13 1610  Gross per 24 hour  Intake    320 ml  Output   2200 ml  Net  -1880 ml     ASSESSMENT AND PLAN:  Atrial fibrillation: Continue warfarin. The rate is well controlled.  She has asymptomatic PVCs and NSVT. No further evaluation is indicated for this.   Respiratory failure: She is being treated with antibiotics.  Continue IV Lasix today.   MR: Severe. Conservative management. No further imaging is needed.   HTN: For now her pervious meds (Norvasc and Lotensin) have been ordered.   Rose Weber [facility] 01/11/2013 12:08 PM

## 2013-01-11 NOTE — Progress Notes (Signed)
Utilization review completed.  P.J. Conswella Bruney,RN,BSN Case Manager [phone]  

## 2013-01-12 DIAGNOSIS — J154 Pneumonia due to other streptococci: Secondary | ICD-10-CM

## 2013-01-12 LAB — BASIC METABOLIC PANEL
BUN: 20 mg/dL (ref 6–23)
Chloride: 98 mEq/L (ref 96–112)
GFR calc Af Amer: 88 mL/min — ABNORMAL LOW (ref >90)
GFR calc non Af Amer: 76 mL/min — ABNORMAL LOW (ref >90)
Glucose, Bld: 114 mg/dL — ABNORMAL HIGH (ref 70–99)
Potassium: 3.6 mEq/L (ref 3.5–5.1)
Sodium: 134 mEq/L — ABNORMAL LOW (ref 135–145)

## 2013-01-12 MED ORDER — WARFARIN SODIUM 5 MG PO TABS
5.0000 mg | ORAL_TABLET | Freq: Once | ORAL | Status: AC
  Administered 2013-01-12: 5 mg via ORAL
  Filled 2013-01-12: qty 1

## 2013-01-12 MED ORDER — FUROSEMIDE 40 MG PO TABS
40.0000 mg | ORAL_TABLET | Freq: Every day | ORAL | Status: DC
  Administered 2013-01-13 – 2013-01-15 (×3): 40 mg via ORAL
  Filled 2013-01-12 (×4): qty 1

## 2013-01-12 NOTE — Progress Notes (Signed)
   SUBJECTIVE:  Breathing better but not at baseline.  No pain.  She is weak.    PHYSICAL EXAM Filed Vitals:   01/12/13 0144 01/12/13 0400 01/12/13 0713 01/12/13 0800  BP:  117/79  112/60  Pulse:  74  91  Temp:  97.5 F (36.4 C)  97.8 F (36.6 C)  TempSrc:  Oral  Oral  Resp:  22  17  Height:      Weight:  101 lb 6.6 oz (46 kg)    SpO2: 91% 94% 92% 97%   General:  No distress Lungs:  Diffuse coarse crackles improved  Heart: Irregular Abdomen: Positive bowel sounds, no rebound no guarding Extremities:  Trace edema improved.  LABS: No results found for this basename: CKTOTAL,  CKMB,  CKMBINDEX,  TROPONINI   Results for orders placed during the hospital encounter of 01/10/13 (from the past 24 hour(s))  PROTIME-INR     Status: Abnormal   Collection Time    01/12/13  4:55 AM      Result Value Range   Prothrombin Time 26.3 (*) 11.6 - 15.2 seconds   INR 2.56 (*) 0.00 - 1.49  BASIC METABOLIC PANEL     Status: Abnormal   Collection Time    01/12/13  8:37 AM      Result Value Range   Sodium 134 (*) 135 - 145 mEq/L   Potassium 3.6  3.5 - 5.1 mEq/L   Chloride 98  96 - 112 mEq/L   CO2 27  19 - 32 mEq/L   Glucose, Bld 114 (*) 70 - 99 mg/dL   BUN 20  6 - 23 mg/dL   Creatinine, Ser 1.19  0.50 - 1.10 mg/dL   Calcium 8.3 (*) 8.4 - 10.5 mg/dL   GFR calc non Af Amer 76 (*) >90 mL/min   GFR calc Af Amer 88 (*) >90 mL/min    Intake/Output Summary (Last 24 hours) at 01/12/13 1014 Last data filed at 01/12/13 0300  Gross per 24 hour  Intake    390 ml  Output    850 ml  Net   -460 ml     ASSESSMENT AND PLAN:  Atrial fibrillation: Continue warfarin. The rate is well controlled.  Ectopy is less frequent.  No change in therapy.     Respiratory failure: She is being treated with antibiotics.  I will switch to PO Lasix today.   MR: Severe. Conservative management. No further imaging is needed.   HTN: For now her pervious meds (Norvasc and Lotensin) have been ordered.   Fayrene Fearing  Brendan Gruwell 01/12/2013 10:14 AM

## 2013-01-12 NOTE — Progress Notes (Signed)
[facility] Progress Note Rose Weber 1 - Stepdown/ICU Weber   TARA RUD ZOX:[id number] DOB: [birth year] DOA: 01/10/2013 PCP: Kimber Relic, MD  Brief narrative: 79 year old female with history of hypertension, mitral regurgitation, A. fib on Coumadin, history of CHF , hypothyroidism with history of goiter, legally blind who is a resident of friend's home was sent to the ED with shortness of breath with increased work of breathing, tachypnea and hypoxia and cough. She has had a cough for about 1 wk. She was found to have multifocal pneumonia.    Assessment/Plan: Healthcare-associated pneumonia  Started on Vanc and Cefepime and Azactam - strep pneumo antigen positive - d/c vanc and cefepime and change to Levaquin  CHF  Cont Lasix daily for now -negative balance by 2400 cc - cont to monitor I/O. and daily weight   HTN (hypertension)  Resume home BP meds   MVP (mitral valve prolapse)  As the associated severe mitral regurgitation. Follows with Dr. Thomasene Lot with [facility]   Atrial fibrillation  Patient injected A. fib on presentation.  Cont  Coumadin. INR therapeutic.  Dosing per pharmacy.  IV lopressor prn   Hypothyroidism  Continue Synthroid  TSH normal  Vtach  Patient noted to have sustained v tach shortly after arriving to the floor.  Low k noted. Replenished with 40 meq . [facility] cardiology consulted- no further treatment recommended.     Code Status: DNR Family Communication: none Disposition Plan: cont to follow in SDU  Consultants: Cardio  Procedures: none  Antibiotics: VAnc/ Zosyn  DVT prophylaxis: Coumadin  HPI/Subjective: Pt alert and is feeling less short of breath than yesterday. No chest pain. Moderate cough.    Objective: Blood pressure 103/45, pulse 76, temperature 97.7 F (36.5 C), temperature source Oral, resp. rate 31, height 5\' 1"  (1.549 m), weight 46 kg (101 lb 6.6 oz), SpO2 92.00%.  Intake/Output Summary (Last 24  hours) at 01/12/13 1241 Last data filed at 01/12/13 0300  Gross per 24 hour  Intake    390 ml  Output    300 ml  Net     90 ml     Exam: General: No acute respiratory distress Lungs: Clear to auscultation bilaterally without wheezes or crackles Cardiovascular: Regular rate and rhythm without murmur gallop or rub normal S1 and S2 Abdomen: Nontender, nondistended, soft, bowel sounds positive, no rebound, no ascites, no appreciable mass Extremities: No significant cyanosis, clubbing, or edema bilateral lower extremities  Data Reviewed: Basic Metabolic Panel:  Recent Labs Lab 01/10/13 1212 01/10/13 1213 01/11/13 0640 01/12/13 0837  NA 136  --  136 134*  K 3.6  --  3.6 3.6  CL 98  --  101 98  CO2 22  --  25 27  GLUCOSE 145*  --  154* 114*  BUN 17  --  16 20  CREATININE 0.52  --  0.53 0.54  CALCIUM 8.8  --  8.2* 8.3*  MG  --  1.8  --   --    Liver Function Tests:  Recent Labs Lab 01/10/13 1212  AST 57*  ALT 29  ALKPHOS 107  BILITOT 1.5*  PROT 6.9  ALBUMIN 3.4*   No results found for this basename: LIPASE, AMYLASE,  in the last 168 hours No results found for this basename: AMMONIA,  in the last 168 hours CBC:  Recent Labs Lab 01/10/13 1212 01/11/13 0640  WBC 6.2 7.8  NEUTROABS 4.8 6.3  HGB 14.5 13.8  HCT 43.2 39.1  MCV  91.9 89.5  PLT 144* 138*   Cardiac Enzymes: No results found for this basename: CKTOTAL, CKMB, CKMBINDEX, TROPONINI,  in the last 168 hours BNP (last 3 results)  Recent Labs  01/10/13 1213  PROBNP 7369.0*   CBG: No results found for this basename: GLUCAP,  in the last 168 hours  Recent Results (from the past 240 hour(s))  CULTURE, BLOOD (ROUTINE X 2)     Status: None   Collection Time    01/10/13  1:20 PM      Result Value Range Status   Specimen Description BLOOD LEFT ARM   Final   Special Requests BOTTLES DRAWN AEROBIC AND ANAEROBIC R 7CC B 10CC   Final   Culture  Setup Time 01/11/2013 00:05   Final   Culture     Final    Value:        BLOOD CULTURE RECEIVED NO GROWTH TO DATE CULTURE WILL BE HELD FOR 5 DAYS BEFORE ISSUING A FINAL NEGATIVE REPORT   Report Status PENDING   Incomplete  CULTURE, BLOOD (ROUTINE X 2)     Status: None   Collection Time    01/10/13  1:30 PM      Result Value Range Status   Specimen Description BLOOD RIGHT ARM   Final   Special Requests BOTTLES DRAWN AEROBIC AND ANAEROBIC 10CC   Final   Culture  Setup Time 01/11/2013 00:05   Final   Culture     Final   Value:        BLOOD CULTURE RECEIVED NO GROWTH TO DATE CULTURE WILL BE HELD FOR 5 DAYS BEFORE ISSUING A FINAL NEGATIVE REPORT   Report Status PENDING   Incomplete  MRSA PCR SCREENING     Status: None   Collection Time    01/10/13  5:10 PM      Result Value Range Status   MRSA by PCR NEGATIVE  NEGATIVE Final   Comment:            The GeneXpert MRSA Assay (FDA     approved for NASAL specimens     only), is one component of a     comprehensive MRSA colonization     surveillance program. It is not     intended to diagnose MRSA     infection nor to guide or     monitor treatment for     MRSA infections.     Studies:  Recent x-ray studies have been reviewed in detail by the Attending Physician  Scheduled Meds:  Scheduled Meds: . albuterol  2.5 mg Nebulization Q6H  . amLODipine  5 mg Oral Daily  . antiseptic oral rinse  15 mL Mouth Rinse BID  . artificial tears   Both Eyes QHS  . benazepril  40 mg Oral BID  . calcium carbonate  1 tablet Oral Daily  . cholecalciferol  2,000 Units Oral Daily  . cycloSPORINE  1 drop Both Eyes TID  . furosemide  40 mg Oral Daily  . levofloxacin (LEVAQUIN) IV  750 mg Intravenous Q48H  . levothyroxine  25 mcg Oral QAC breakfast  . potassium chloride SA  20 mEq Oral Daily  . prednisoLONE acetate  1 drop Right Eye TID   And  . prednisoLONE acetate  1 drop Left Eye Daily  . timolol  1 drop Both Eyes BID  . warfarin  5 mg Oral ONCE-1800  . Warfarin - Pharmacist Dosing Inpatient   Does not  apply q1800   Continuous  Infusions:   Time spent on care of this patient: 35 min   Calvert Cantor, MD [phone]  [facility] Office  [phone] Pager - Text Page per Amion as per below:  On-Call/Text Page:      [email or web address]      password [email or web address]  If 7PM-7AM, please contact night-coverage [email or web address] Password [facility] 01/12/2013, 12:41 PM   LOS: 2 days

## 2013-01-12 NOTE — Progress Notes (Signed)
[facility] Progress Note [place] TEAM 1 - Stepdown/ICU TEAM   MAEZIE JUSTIN JYN:[id number] DOB: 17-May-1913 DOA: 01/10/2013 PCP: Kimber Relic, MD  Brief narrative: 79 year old female with history of hypertension, mitral regurgitation, A. fib on Coumadin, history of CHF , hypothyroidism with history of goiter, legally blind who is a resident of friend's home was sent to the ED with shortness of breath with increased work of breathing, tachypnea and hypoxia and cough. She has had a cough for about 1 wk. She was found to have multifocal pneumonia.    Assessment/Plan: Strep Pneumonia Started on Vanc and Cefepime and Azactam - strep pneumo antigen positive - d/c vanc and cefepime and changed to Levaquin on 5/24 - now off O2  CHF  -cardio has changed Lasix to oral -negative balance by about 2300 cc - cont to monitor I/O. and daily weight   HTN (hypertension)  Resumed home BP meds   MVP (mitral valve prolapse)  -with associated severe mitral regurgitation. Follows with Dr. Thomasene Lot with [facility]   Atrial fibrillation   Cont  Coumadin. INR therapeutic.  Dosing per pharmacy.  IV lopressor prn - not on rate controlling agents at home  Hypothyroidism  Continue Synthroid  TSH normal  Vtach  Patient noted to have sustained v tach shortly after arriving to the floor.  Low k noted. Replenished with 40 meq . [facility] cardiology consulted- no further treatment recommended.     Code Status: DNR Family Communication: none Disposition Plan: transfer to med/surg- PT eval- pt lives [facility]- may need to go to the assisted living section after discharge from here  Consultants: Cardio  Procedures: none  Antibiotics: VAnc/ Zosyn  DVT prophylaxis: Coumadin  HPI/Subjective: Pt alert and sitting up in a chair- cough is productive- dyspnea improved- quite weak.   Objective: Blood pressure 103/45, pulse 76, temperature 97.7 F (36.5 C),  temperature source Oral, resp. rate 31, height 5\' 1"  (1.549 m), weight 46 kg (101 lb 6.6 oz), SpO2 92.00%.  Intake/Output Summary (Last 24 hours) at 01/12/13 1252 Last data filed at 01/12/13 0300  Gross per 24 hour  Intake    390 ml  Output    300 ml  Net     90 ml     Exam: General: No acute respiratory distress Lungs: crackle in mid and lower lung fields Cardiovascular: Regular rate and rhythm without murmur gallop or rub normal S1 and S2 Abdomen: Nontender, nondistended, soft, bowel sounds positive, no rebound, no ascites, no appreciable mass Extremities: No significant cyanosis, clubbing, or edema bilateral lower extremities  Data Reviewed: Basic Metabolic Panel:  Recent Labs Lab 01/10/13 1212 01/10/13 1213 01/11/13 0640 01/12/13 0837  NA 136  --  136 134*  K 3.6  --  3.6 3.6  CL 98  --  101 98  CO2 22  --  25 27  GLUCOSE 145*  --  154* 114*  BUN 17  --  16 20  CREATININE 0.52  --  0.53 0.54  CALCIUM 8.8  --  8.2* 8.3*  MG  --  1.8  --   --    Liver Function Tests:  Recent Labs Lab 01/10/13 1212  AST 57*  ALT 29  ALKPHOS 107  BILITOT 1.5*  PROT 6.9  ALBUMIN 3.4*   No results found for this basename: LIPASE, AMYLASE,  in the last 168 hours No results found for this basename: AMMONIA,  in the last 168 hours CBC:  Recent Labs Lab 01/10/13 1212 01/11/13 0640  WBC 6.2 7.8  NEUTROABS 4.8 6.3  HGB 14.5 13.8  HCT 43.2 39.1  MCV 91.9 89.5  PLT 144* 138*   Cardiac Enzymes: No results found for this basename: CKTOTAL, CKMB, CKMBINDEX, TROPONINI,  in the last 168 hours BNP (last 3 results)  Recent Labs  01/10/13 1213  PROBNP 7369.0*   CBG: No results found for this basename: GLUCAP,  in the last 168 hours  Recent Results (from the past 240 hour(s))  CULTURE, BLOOD (ROUTINE X 2)     Status: None   Collection Time    01/10/13  1:20 PM      Result Value Range Status   Specimen Description BLOOD LEFT ARM   Final   Special Requests BOTTLES DRAWN  AEROBIC AND ANAEROBIC R 7CC B 10CC   Final   Culture  Setup Time 01/11/2013 00:05   Final   Culture     Final   Value:        BLOOD CULTURE RECEIVED NO GROWTH TO DATE CULTURE WILL BE HELD FOR 5 DAYS BEFORE ISSUING A FINAL NEGATIVE REPORT   Report Status PENDING   Incomplete  CULTURE, BLOOD (ROUTINE X 2)     Status: None   Collection Time    01/10/13  1:30 PM      Result Value Range Status   Specimen Description BLOOD RIGHT ARM   Final   Special Requests BOTTLES DRAWN AEROBIC AND ANAEROBIC 10CC   Final   Culture  Setup Time 01/11/2013 00:05   Final   Culture     Final   Value:        BLOOD CULTURE RECEIVED NO GROWTH TO DATE CULTURE WILL BE HELD FOR 5 DAYS BEFORE ISSUING A FINAL NEGATIVE REPORT   Report Status PENDING   Incomplete  MRSA PCR SCREENING     Status: None   Collection Time    01/10/13  5:10 PM      Result Value Range Status   MRSA by PCR NEGATIVE  NEGATIVE Final   Comment:            The GeneXpert MRSA Assay (FDA     approved for NASAL specimens     only), is one component of a     comprehensive MRSA colonization     surveillance program. It is not     intended to diagnose MRSA     infection nor to guide or     monitor treatment for     MRSA infections.     Studies:  Recent x-ray studies have been reviewed in detail by the Attending Physician  Scheduled Meds:  Scheduled Meds: . albuterol  2.5 mg Nebulization Q6H  . amLODipine  5 mg Oral Daily  . antiseptic oral rinse  15 mL Mouth Rinse BID  . artificial tears   Both Eyes QHS  . benazepril  40 mg Oral BID  . calcium carbonate  1 tablet Oral Daily  . cholecalciferol  2,000 Units Oral Daily  . cycloSPORINE  1 drop Both Eyes TID  . furosemide  40 mg Oral Daily  . levofloxacin (LEVAQUIN) IV  750 mg Intravenous Q48H  . levothyroxine  25 mcg Oral QAC breakfast  . potassium chloride SA  20 mEq Oral Daily  . prednisoLONE acetate  1 drop Right Eye TID   And  . prednisoLONE acetate  1 drop Left Eye Daily  .  timolol  1 drop Both Eyes  BID  . warfarin  5 mg Oral ONCE-1800  . Warfarin - Pharmacist Dosing Inpatient   Does not apply q1800   Continuous Infusions:   Time spent on care of this patient: 35 min   Calvert Cantor, MD [phone]  [facility] Office  [phone] Pager - Text Page per Amion as per below:  On-Call/Text Page:      [email or web address]      password [email or web address]  If 7PM-7AM, please contact night-coverage [email or web address] Password [facility] 01/12/2013, 12:52 PM   LOS: 2 days

## 2013-01-12 NOTE — Progress Notes (Deleted)
[facility] Progress Note [place] TEAM 1 - Stepdown/ICU TEAM   Rose Weber ZOX:[id number] DOB: 04/01/1913 DOA: 01/10/2013 PCP: Kimber Relic, MD  Brief narrative: 79 year old female with history of hypertension, mitral regurgitation, A. fib on Coumadin, history of CHF , hypothyroidism with history of goiter, legally blind who [facility] was sent to the ED with shortness of breath with increased work of breathing, tachypnea and hypoxia and cough. She has had a cough for about 1 wk. She was found to have multifocal pneumonia.    Assessment/Plan: Strep Pneumonia Started on Vanc and Cefepime and Azactam - strep pneumo antigen positive - d/c vanc and cefepime and changed to Levaquin on 5/24 - now off O2  CHF  -cardio has changed Lasix to oral -negative balance by about 2300 cc - cont to monitor I/O. and daily weight   HTN (hypertension)  Resumed home BP meds   MVP (mitral valve prolapse)  -with associated severe mitral regurgitation. Follows with Dr. Thomasene Lot with [facility]   Atrial fibrillation   Cont  Coumadin. INR therapeutic.  Dosing per pharmacy.  IV lopressor prn - not on rate controlling agents at home  Hypothyroidism  Continue Synthroid  TSH normal  Vtach  Patient noted to have sustained v tach shortly after arriving to the floor.  Low k noted. Replenished with 40 meq . [facility] cardiology consulted- no further treatment recommended.     Code Status: DNR Family Communication: none Disposition Plan: transfer to med/surg- PT eval- pt lives in independent living at [facility]- may need to go to the assisted living section after discharge from here  Consultants: Cardio  Procedures: none  Antibiotics: VAnc/ Zosyn  DVT prophylaxis: Coumadin  HPI/Subjective: Pt alert and sitting up in a chair- cough is productive- dyspnea improved- quite weak.   Objective: Blood pressure 103/45, pulse 76, temperature 97.7 F (36.5 C),  temperature source Oral, resp. rate 31, height 5\' 1"  (1.549 m), weight 46 kg (101 lb 6.6 oz), SpO2 92.00%.  Intake/Output Summary (Last 24 hours) at 01/12/13 1246 Last data filed at 01/12/13 0300  Gross per 24 hour  Intake    390 ml  Output    300 ml  Net     90 ml     Exam: General: No acute respiratory distress Lungs: crackle in mid and lower lung fields Cardiovascular: Regular rate and rhythm without murmur gallop or rub normal S1 and S2 Abdomen: Nontender, nondistended, soft, bowel sounds positive, no rebound, no ascites, no appreciable mass Extremities: No significant cyanosis, clubbing, or edema bilateral lower extremities  Data Reviewed: Basic Metabolic Panel:  Recent Labs Lab 01/10/13 1212 01/10/13 1213 01/11/13 0640 01/12/13 0837  NA 136  --  136 134*  K 3.6  --  3.6 3.6  CL 98  --  101 98  CO2 22  --  25 27  GLUCOSE 145*  --  154* 114*  BUN 17  --  16 20  CREATININE 0.52  --  0.53 0.54  CALCIUM 8.8  --  8.2* 8.3*  MG  --  1.8  --   --    Liver Function Tests:  Recent Labs Lab 01/10/13 1212  AST 57*  ALT 29  ALKPHOS 107  BILITOT 1.5*  PROT 6.9  ALBUMIN 3.4*   No results found for this basename: LIPASE, AMYLASE,  in the last 168 hours No results found for this basename: AMMONIA,  in the last 168 hours CBC:  Recent Labs Lab 01/10/13 1212 01/11/13 0640  WBC 6.2 7.8  NEUTROABS 4.8 6.3  HGB 14.5 13.8  HCT 43.2 39.1  MCV 91.9 89.5  PLT 144* 138*   Cardiac Enzymes: No results found for this basename: CKTOTAL, CKMB, CKMBINDEX, TROPONINI,  in the last 168 hours BNP (last 3 results)  Recent Labs  01/10/13 1213  PROBNP 7369.0*   CBG: No results found for this basename: GLUCAP,  in the last 168 hours  Recent Results (from the past 240 hour(s))  CULTURE, BLOOD (ROUTINE X 2)     Status: None   Collection Time    01/10/13  1:20 PM      Result Value Range Status   Specimen Description BLOOD LEFT ARM   Final   Special Requests BOTTLES DRAWN  AEROBIC AND ANAEROBIC R 7CC B 10CC   Final   Culture  Setup Time 01/11/2013 00:05   Final   Culture     Final   Value:        BLOOD CULTURE RECEIVED NO GROWTH TO DATE CULTURE WILL BE HELD FOR 5 DAYS BEFORE ISSUING A FINAL NEGATIVE REPORT   Report Status PENDING   Incomplete  CULTURE, BLOOD (ROUTINE X 2)     Status: None   Collection Time    01/10/13  1:30 PM      Result Value Range Status   Specimen Description BLOOD RIGHT ARM   Final   Special Requests BOTTLES DRAWN AEROBIC AND ANAEROBIC 10CC   Final   Culture  Setup Time 01/11/2013 00:05   Final   Culture     Final   Value:        BLOOD CULTURE RECEIVED NO GROWTH TO DATE CULTURE WILL BE HELD FOR 5 DAYS BEFORE ISSUING A FINAL NEGATIVE REPORT   Report Status PENDING   Incomplete  MRSA PCR SCREENING     Status: None   Collection Time    01/10/13  5:10 PM      Result Value Range Status   MRSA by PCR NEGATIVE  NEGATIVE Final   Comment:            The GeneXpert MRSA Assay (FDA     approved for NASAL specimens     only), is one component of a     comprehensive MRSA colonization     surveillance program. It is not     intended to diagnose MRSA     infection nor to guide or     monitor treatment for     MRSA infections.     Studies:  Recent x-ray studies have been reviewed in detail by the Attending Physician  Scheduled Meds:  Scheduled Meds: . albuterol  2.5 mg Nebulization Q6H  . amLODipine  5 mg Oral Daily  . antiseptic oral rinse  15 mL Mouth Rinse BID  . artificial tears   Both Eyes QHS  . benazepril  40 mg Oral BID  . calcium carbonate  1 tablet Oral Daily  . cholecalciferol  2,000 Units Oral Daily  . cycloSPORINE  1 drop Both Eyes TID  . furosemide  40 mg Oral Daily  . levofloxacin (LEVAQUIN) IV  750 mg Intravenous Q48H  . levothyroxine  25 mcg Oral QAC breakfast  . potassium chloride SA  20 mEq Oral Daily  . prednisoLONE acetate  1 drop Right Eye TID   And  . prednisoLONE acetate  1 drop Left Eye Daily  .  timolol  1 drop Both Eyes  BID  . warfarin  5 mg Oral ONCE-1800  . Warfarin - Pharmacist Dosing Inpatient   Does not apply q1800   Continuous Infusions:   Time spent on care of this patient: 35 min   Calvert Cantor, MD [phone]  [facility] Office  [phone] Pager - Text Page per Amion as per below:  On-Call/Text Page:      [email or web address]      password [email or web address]  If 7PM-7AM, please contact night-coverage [email or web address] Password [facility] 01/12/2013, 12:46 PM   LOS: 2 days

## 2013-01-12 NOTE — Progress Notes (Signed)
ANTICOAGULATION  CONSULT NOTE-  Coumadin  Indication :  H/o atrial fibrillation;  On coumadin PTA  ANTIBIOTIC CONSULT NOTE - follow up and  ANTIBIOTICS renal dose adjustment Levofloxacin Indication: suspected pneumonia   Patient Measurements: Height: 5\' 1"  (154.9 cm) Weight: 101 lb 6.6 oz (46 kg) IBW/kg (Calculated) : 47.8   Vital Signs: Temp: 97.8 F (36.6 C) (05/25 0800) Temp src: Oral (05/25 0800) BP: 112/60 mmHg (05/25 0800) Pulse Rate: 91 (05/25 0800) Intake/Output from previous day: 05/24 0701 - 05/25 0700 In: 390 [P.O.:240; IV Piggyback:150] Out: 850 [Urine:850] Intake/Output from this shift:    Labs:  INR/Prothrombin Time = 2.56   Recent Labs  01/10/13 1212 01/11/13 0640  WBC 6.2 7.8  HGB 14.5 13.8  PLT 144* 138*  CREATININE 0.52 0.53   Estimated Creatinine Clearance: 28.5 ml/min (by C-G formula based on Cr of 0.53). No results found for this basename: VANCOTROUGH, Leodis Binet, VANCORANDOM, GENTTROUGH, GENTPEAK, GENTRANDOM, TOBRATROUGH, TOBRAPEAK, TOBRARND, AMIKACINPEAK, AMIKACINTROU, AMIKACIN,  in the last 72 hours   Microbiology: Recent Results (from the past 720 hour(s))  MRSA PCR SCREENING     Status: None   Collection Time    01/10/13  5:10 PM      Result Value Range Status   MRSA by PCR NEGATIVE  NEGATIVE Final   Comment:            The GeneXpert MRSA Assay (FDA     approved for NASAL specimens     only), is one component of a     comprehensive MRSA colonization     surveillance program. It is not     intended to diagnose MRSA     infection nor to guide or     monitor treatment for     MRSA infections.   Assessment:  Rose Weber is a 79yo F who continues on warfarin while hospitalized for AFIB.  Her PTA dosage regimen at the assisted living facility was coumadin 5mg  daily except on TTSat takes 2.5mg . The INR today is 2.56 which is therapeutic.  SCr = 0.53, estimated CrCl ~ 28 ml/min,  Wt = 45 kg,  Age 79 y.o female  Antibiotics changed to  Levofloxacin on 5/24.  STREPTOCOCCUS PNEUMONIAE URINARY ANTIGEN is POSITIVE  Goal of Therapy:  INR = 2-3 Vancomycin trough level 15-20 mcg/ml  Plan:  Continue levofloxacin 750mg  IV q48h Give coumadin 5mg  tonight  Daily PT/INR   Celedonio Miyamoto, PharmD, BCPS Clinical Pharmacist Pager [phone]

## 2013-01-13 DIAGNOSIS — E039 Hypothyroidism, unspecified: Secondary | ICD-10-CM

## 2013-01-13 DIAGNOSIS — R0609 Other forms of dyspnea: Secondary | ICD-10-CM

## 2013-01-13 DIAGNOSIS — R0989 Other specified symptoms and signs involving the circulatory and respiratory systems: Secondary | ICD-10-CM

## 2013-01-13 DIAGNOSIS — I10 Essential (primary) hypertension: Secondary | ICD-10-CM

## 2013-01-13 DIAGNOSIS — Z7901 Long term (current) use of anticoagulants: Secondary | ICD-10-CM

## 2013-01-13 LAB — PROTIME-INR: INR: 1.85 — ABNORMAL HIGH (ref 0.00–1.49)

## 2013-01-13 MED ORDER — NON FORMULARY: 1.0000 [drp] | Freq: Three times a day (TID) | Status: DC

## 2013-01-13 MED ORDER — BENAZEPRIL HCL 20 MG PO TABS
20.0000 mg | ORAL_TABLET | Freq: Two times a day (BID) | ORAL | Status: DC
  Administered 2013-01-13 – 2013-01-15 (×4): 20 mg via ORAL
  Filled 2013-01-13 (×5): qty 1

## 2013-01-13 MED ORDER — WARFARIN SODIUM 5 MG PO TABS
5.0000 mg | ORAL_TABLET | Freq: Once | ORAL | Status: AC
  Administered 2013-01-13: 5 mg via ORAL
  Filled 2013-01-13: qty 1

## 2013-01-13 MED ORDER — CARBOXYMETHYLCELLULOSE SODIUM 0.5 % OP SOLN
1.0000 [drp] | Freq: Three times a day (TID) | OPHTHALMIC | Status: DC
  Administered 2013-01-13 – 2013-01-15 (×5): 1 [drp] via OPHTHALMIC

## 2013-01-13 MED ORDER — AMLODIPINE BESYLATE 2.5 MG PO TABS
2.5000 mg | ORAL_TABLET | Freq: Every day | ORAL | Status: DC
  Administered 2013-01-14 – 2013-01-15 (×2): 2.5 mg via ORAL
  Filled 2013-01-13 (×2): qty 1

## 2013-01-13 NOTE — Progress Notes (Signed)
ANTICOAGULATION CONSULT NOTE - Follow Up Consult  Pharmacy Consult for warfarin Indication: atrial fibrillation  Allergies  Allergen Reactions  . Alphagan (Brimonidine Tartrate)   . Bystolic (Nebivolol Hcl)   . Diovan (Valsartan)   . Other     PRESERVATIVES  . Penicillins   . Polymyxin B-Trimethoprim   . Sulfa Drugs Cross Reactors   . Tekturna (Aliskiren Fumarate)   . Travatan Z     Patient Measurements: Height: 5\' 1"  (154.9 cm) Weight: 94 lb 5.7 oz (42.8 kg) (scale B) IBW/kg (Calculated) : 47.8   Vital Signs: Temp: 97.5 F (36.4 C) (05/26 0537) Temp src: Oral (05/26 0537) BP: 114/60 mmHg (05/26 0537) Pulse Rate: 61 (05/26 0537)  Labs:  Recent Labs  01/10/13 1212  01/11/13 0640 01/12/13 0455 01/12/13 0837 01/13/13 0423  HGB 14.5  --  13.8  --   --   --   HCT 43.2  --  39.1  --   --   --   PLT 144*  --  138*  --   --   --   LABPROT  --   < > 28.4* 26.3*  --  20.7*  INR  --   < > 2.84* 2.56*  --  1.85*  CREATININE 0.52  --  0.53  --  0.54  --   < > = values in this interval not displayed.  Estimated Creatinine Clearance: 26.5 ml/min (by C-G formula based on Cr of 0.54).   Medications:  Prescriptions prior to admission  Medication Sig Dispense Refill  . amLODipine (NORVASC) 5 MG tablet Take 1 tablet (5 mg total) by mouth daily.  60 tablet  6  . Artificial Tear Ointment (REFRESH LACRI-LUBE) OINT Apply 1 application to eye at bedtime.      . benazepril (LOTENSIN) 40 MG tablet Take 40 mg by mouth 2 (two) times daily.      Marland Kitchen bismuth subsalicylate (PEPTO BISMOL) 262 MG/15ML suspension Take 15 mLs by mouth as needed for indigestion.       . calcium carbonate (OS-CAL) 600 MG TABS Take 600 mg by mouth daily.      . carboxymethylcellulose (REFRESH PLUS) 0.5 % SOLN Place 1 drop into both eyes as needed (dry eyes).      . Cholecalciferol (VITAMIN D) 2000 UNIT tablet Take 2,000 Units by mouth daily.        . cycloSPORINE (RESTASIS) 0.05 % ophthalmic emulsion Apply 1  drop to eye 3 (three) times daily.      Marland Kitchen estrogens, conjugated, (PREMARIN) 0.3 MG tablet Take 0.3 mg by mouth See admin instructions. Take daily for 21 days then do not take for 7 days.      . furosemide (LASIX) 20 MG tablet Take 20 mg by mouth See admin instructions. 20mg  daily and may take additional dose for edema if needed      . levothyroxine (SYNTHROID, LEVOTHROID) 25 MCG tablet Take 25 mcg by mouth daily.        . potassium chloride SA (K-DUR,KLOR-CON) 20 MEQ tablet Take 20 mEq by mouth daily.      . prednisoLONE acetate (PRED FORTE) 1 % ophthalmic suspension Apply 1 drop to eye See admin instructions. Right eye three times a day; left eye daily      . timolol (TIMOPTIC) 0.5 % ophthalmic solution Place 1 drop into both eyes 2 (two) times daily.       Marland Kitchen warfarin (COUMADIN) 2.5 MG tablet Take 2.5-5 mg by mouth daily. 2.5mg  Tuesday,  Thursday, Saturday; 5mg  the rest of the week        Assessment: 89 y/oF on Coumadin pta for afib continuing on admission. PTA dose was 5 mg daily except 2.5 mg on TTSat.  INR dropped to 1.85 today.  No bleeding noted.   Goal of Therapy:  INR 2-3 Monitor platelets by anticoagulation protocol: Yes   Plan:  Warfarin 5 mg x1 po tonight Daily INR   Doris Cheadle, PharmD Clinical Pharmacist Pager: [phone] Phone: [phone] 01/13/2013 8:47 AM

## 2013-01-13 NOTE — Progress Notes (Signed)
[facility] Progress Note   RAIANNA SLIGHT ZOX:[id number] DOB: [birth year] DOA: 01/10/2013 PCP: Kimber Relic, MD  Brief narrative: 79 year old female with history of hypertension, mitral regurgitation, A. fib on Coumadin, history of CHF , hypothyroidism with history of goiter, legally blind who [facility] was sent to the ED with shortness of breath with increased work of breathing, tachypnea and hypoxia and cough. She has had a cough for about 1 wk. She was found to have multifocal pneumonia.    Assessment/Plan: Strep Pneumonia Started on Vanc and Cefepime and Azactam - strep pneumo antigen positive - d/c vanc and cefepime and changed to Levaquin on 5/24 - now off O2 and breathing comfortable  CHF  -cardio has changed Lasix to oral -negative balance by about 2300 cc - cont to monitor I/O. and daily weight   HTN (hypertension)  BP is soft -will cut antihypertensive agents to avoid hypotension.  MVP (mitral valve prolapse)  -with associated severe mitral regurgitation. Follows with Dr. [place] with [facility]   Atrial fibrillation   Cont  Coumadin. INR therapeutic.  Dosing per pharmacy.  IV lopressor prn - not on rate controlling agents at home  Hypothyroidism  Continue Synthroid  TSH normal  Vtach  -Patient noted to have sustained v tach shortly after admission -Low k noted. Replenished and [facility] cardiology consulted- no further treatment recommended.  -HR stable.    Code Status: DNR Family Communication: none Disposition Plan: transfer to med/surg- PT eval- pt lives in independent living at [facility]- may need to go to the assisted living section after discharge from here  Consultants: Cardio  Procedures: none  Antibiotics: VAnc/ Zosyn  DVT prophylaxis: Coumadin  HPI/Subjective: Afebrile, denies CP or significant SOB. Reports ongoing productive cough; no abdominal pain, nausea or vomiting. Patient is frail and  weak.  Objective: Blood pressure 105/52, pulse 67, temperature 97.5 F (36.4 C), temperature source Oral, resp. rate 16, height 5\' 1"  (1.549 m), weight 42.8 kg (94 lb 5.7 oz), SpO2 93.00%.  Intake/Output Summary (Last 24 hours) at 01/13/13 1532 Last data filed at 01/13/13 1338  Gross per 24 hour  Intake    800 ml  Output    350 ml  Net    450 ml     Exam: General: No acute respiratory distress; feeling betetr and afebrile Lungs: no crackles; positive rhonchi Cardiovascular: S1 and S2, no rubs or gallops Abdomen: Nontender, nondistended, soft, bowel sounds positive, no rebound, no ascites, no appreciable mass Extremities: No significant cyanosis, clubbing, or edema bilateral lower extremities  Data Reviewed: Basic Metabolic Panel:  Recent Labs Lab 01/10/13 1212 01/10/13 1213 01/11/13 0640 01/12/13 0837  NA 136  --  136 134*  K 3.6  --  3.6 3.6  CL 98  --  101 98  CO2 22  --  25 27  GLUCOSE 145*  --  154* 114*  BUN 17  --  16 20  CREATININE 0.52  --  0.53 0.54  CALCIUM 8.8  --  8.2* 8.3*  MG  --  1.8  --   --    Liver Function Tests:  Recent Labs Lab 01/10/13 1212  AST 57*  ALT 29  ALKPHOS 107  BILITOT 1.5*  PROT 6.9  ALBUMIN 3.4*   CBC:  Recent Labs Lab 01/10/13 1212 01/11/13 0640  WBC 6.2 7.8  NEUTROABS 4.8 6.3  HGB 14.5 13.8  HCT 43.2 39.1  MCV 91.9 89.5  PLT 144* 138*  BNP (last 3 results)  Recent Labs  01/10/13 1213  PROBNP 7369.0*   CBG: No results found for this basename: GLUCAP,  in the last 168 hours  Recent Results (from the past 240 hour(s))  CULTURE, BLOOD (ROUTINE X 2)     Status: None   Collection Time    01/10/13  1:20 PM      Result Value Range Status   Specimen Description BLOOD LEFT ARM   Final   Special Requests BOTTLES DRAWN AEROBIC AND ANAEROBIC R 7CC B 10CC   Final   Culture  Setup Time 01/11/2013 00:05   Final   Culture     Final   Value:        BLOOD CULTURE RECEIVED NO GROWTH TO DATE CULTURE WILL BE HELD FOR  5 DAYS BEFORE ISSUING A FINAL NEGATIVE REPORT   Report Status PENDING   Incomplete  CULTURE, BLOOD (ROUTINE X 2)     Status: None   Collection Time    01/10/13  1:30 PM      Result Value Range Status   Specimen Description BLOOD RIGHT ARM   Final   Special Requests BOTTLES DRAWN AEROBIC AND ANAEROBIC 10CC   Final   Culture  Setup Time 01/11/2013 00:05   Final   Culture     Final   Value:        BLOOD CULTURE RECEIVED NO GROWTH TO DATE CULTURE WILL BE HELD FOR 5 DAYS BEFORE ISSUING A FINAL NEGATIVE REPORT   Report Status PENDING   Incomplete  MRSA PCR SCREENING     Status: None   Collection Time    01/10/13  5:10 PM      Result Value Range Status   MRSA by PCR NEGATIVE  NEGATIVE Final   Comment:            The GeneXpert MRSA Assay (FDA     approved for NASAL specimens     only), is one component of a     comprehensive MRSA colonization     surveillance program. It is not     intended to diagnose MRSA     infection nor to guide or     monitor treatment for     MRSA infections.     Studies:  Recent x-ray studies have been reviewed in detail by the Attending Physician  Scheduled Meds:  Scheduled Meds: . albuterol  2.5 mg Nebulization Q6H  . amLODipine  5 mg Oral Daily  . antiseptic oral rinse  15 mL Mouth Rinse BID  . artificial tears   Both Eyes QHS  . benazepril  40 mg Oral BID  . calcium carbonate  1 tablet Oral Daily  . carboxymethylcellulose  1 drop Both Eyes TID  . cholecalciferol  2,000 Units Oral Daily  . furosemide  40 mg Oral Daily  . levofloxacin (LEVAQUIN) IV  750 mg Intravenous Q48H  . levothyroxine  25 mcg Oral QAC breakfast  . potassium chloride SA  20 mEq Oral Daily  . prednisoLONE acetate  1 drop Right Eye TID   And  . prednisoLONE acetate  1 drop Left Eye Daily  . timolol  1 drop Both Eyes BID  . warfarin  5 mg Oral ONCE-1800  . Warfarin - Pharmacist Dosing Inpatient   Does not apply q1800   Continuous Infusions:   Time spent on care of this  patient: < 35 min   Laisa Larrick, MD [phone]  If 7PM-7AM, please contact night-coverage [email or web address] Password  [email or web address] 01/13/2013, 3:32 PM   LOS: 3 days

## 2013-01-13 NOTE — Progress Notes (Addendum)
Patient Name: Rose Weber      SUBJECTIVE:*   admitted with pneumonia and volume overload; short of breath  feelin much better, back at baseline  She has severe MR and afib,  Med Rx   Past Medical History  Diagnosis Date  . HTN (hypertension)   . MVP (mitral valve prolapse)   . Mitral insufficiency     SEVERE  . Chronic anticoagulation   . Edema   . GERD (gastroesophageal reflux disease)   . Personal history of goiter   . Hypothyroidism   . CHF (congestive heart failure)   . Mitral valve prolapse     With severe mitral insufficiency  . Atrial fibrillation   . Goiter     History of goiter    PHYSICAL EXAM Filed Vitals:   01/12/13 1824 01/12/13 2120 01/13/13 0114 01/13/13 0537  BP: 123/60 125/57 94/58 114/60  Pulse: 89 88 78 61  Temp: 98 F (36.7 C) 98.2 F (36.8 C) 97.1 F (36.2 C) 97.5 F (36.4 C)  TempSrc: Oral Oral Oral Oral  Resp: 22 18 16 18   Height: 5\' 1"  (1.549 m)     Weight: 94 lb 2.2 oz (42.7 kg)   94 lb 5.7 oz (42.8 kg)  SpO2: 94% 94% 97% 100%   I/O -4L   General appearance: alert and cooperative Lungs: rhonchi bilaterally Heart: irregularly irregular rhythm Extremities: edema absent Skin: Skin color, texture, turgor normal. No rashes or lesions Neurologic: Alert and oriented X 3, normal strength and tone. Normal symmetric reflexes. Normal coordination and gait  TELEMETRY: Reviewed telemetry pt in afib controlled ventricular repsonse:    Intake/Output Summary (Last 24 hours) at 01/13/13 0853 Last data filed at 01/13/13 0600  Gross per 24 hour  Intake    700 ml  Output   1100 ml  Net   -400 ml    LABS: Basic Metabolic Panel:  Recent Labs Lab 01/10/13 1212 01/10/13 1213 01/11/13 0640 01/12/13 0837  NA 136  --  136 134*  K 3.6  --  3.6 3.6  CL 98  --  101 98  CO2 22  --  25 27  GLUCOSE 145*  --  154* 114*  BUN 17  --  16 20  CREATININE 0.52  --  0.53 0.54  CALCIUM 8.8  --  8.2* 8.3*  MG  --  1.8  --   --    Cardiac  Enzymes: No results found for this basename: CKTOTAL, CKMB, CKMBINDEX, TROPONINI,  in the last 72 hours CBC:  Recent Labs Lab 01/10/13 1212 01/11/13 0640  WBC 6.2 7.8  NEUTROABS 4.8 6.3  HGB 14.5 13.8  HCT 43.2 39.1  MCV 91.9 89.5  PLT 144* 138*   PROTIME:  Recent Labs  01/11/13 0640 01/12/13 0455 01/13/13 0423  LABPROT 28.4* 26.3* 20.7*  INR 2.84* 2.56* 1.85*   Liver Function Tests:  Recent Labs  01/10/13 1212  AST 57*  ALT 29  ALKPHOS 107  BILITOT 1.5*  PROT 6.9  ALBUMIN 3.4*   No results found for this basename: LIPASE, AMYLASE,  in the last 72 hours BNP: BNP (last 3 results)  Recent Labs  01/10/13 1213  PROBNP 7369.0*     Recent Labs  01/10/13 1557  TSH 1.034     ASSESSMENT AND PLAN:  Principal Problem:   Respiratory failure, acute Active Problems:   HTN (hypertension)   MVP (mitral valve prolapse)   Chronic anticoagulation   Atrial fibrillation   GERD (  gastroesophageal reflux disease)   Hypothyroidism   CHF (congestive heart failure)   Streptococcal pneumonia  Doing well from cardiology perspective Urine dirty UA neg a couple of days ago  nothing more to add today  Signed, Sherryl Manges MD  01/13/2013

## 2013-01-13 NOTE — Evaluation (Signed)
Physical Therapy Evaluation Patient Details Name: Rose Weber MRN: [id number] DOB: Jul 18, 1913 Today's Date: 01/13/2013 Time: 0805-0829 PT Time Calculation (min): 24 min  PT Assessment / Plan / Recommendation Clinical Impression  Pt is a 79yo female resident at independent living of [facility] who was admitted for SOB and PNA. Pt is also legally blind. Pt indep PTA but now at increased falls risk requiring assist to maintain safe transfers and ambulation. Pt to benefit from ST-SNF stay (at [facility]) prior to transitioning back to indep. apartment. Pt agree and feels she is weak with impaired balance. Acute PT to con't to follow.    PT Assessment  Patient needs continued PT services    Follow Up Recommendations  SNF;Supervision/Assistance - 24 hour    Does the patient have the potential to tolerate intense rehabilitation      Barriers to Discharge        Equipment Recommendations  Rolling walker with 5" wheels    Recommendations for Other Services     Frequency Min 3X/week    Precautions / Restrictions Precautions Precautions: Fall Precaution Comments: Pt with poor vision, legally blind Restrictions Weight Bearing Restrictions: No   Pertinent Vitals/Pain Pt denies pain. C/o "I just feel tired and weak."      Mobility  Bed Mobility Bed Mobility: Supine to Sit Supine to Sit: 5: Supervision;HOB flat Details for Bed Mobility Assistance: pt desires to use rail Transfers Transfers: Sit to Stand;Stand to Sit Sit to Stand: 3: Mod assist;With upper extremity assist;From bed Stand to Sit: 4: Min assist;With upper extremity assist;To chair/3-in-1 Details for Transfer Assistance: pt very unsteady upon standing requiring modA to maintain balance, pt reaching for things to hold onto with increased anxiety re: falling Ambulation/Gait Ambulation/Gait Assistance: 4: Min guard;4: Min assist Ambulation Distance (Feet): 100 Feet Assistive device: Rolling walker Ambulation/Gait  Assistance Details: pt required minA via L HHA when amb without RW. pt consistently reaching for objects with R UE. Pt given RW, pt significantly more stable and reports feeling steady. pt min guard for amb with RW due to poor vision Gait Pattern: Step-through pattern;Decreased stride length Gait velocity: wfl for age General Gait Details: no LOB with RW, pt at significant risk for falls without RW Stairs: No    Exercises     PT Diagnosis: Generalized weakness  PT Problem List: Decreased strength;Decreased activity tolerance;Decreased balance;Decreased mobility PT Treatment Interventions: DME instruction;Gait training;Stair training;Functional mobility training;Therapeutic activities;Therapeutic exercise;Balance training   PT Goals Acute Rehab PT Goals PT Goal Formulation: With patient Time For Goal Achievement: 01/27/13 Potential to Achieve Goals: Good Pt will go Sit to Stand: with modified independence;with upper extremity assist (up to RW) PT Goal: Sit to Stand - Progress: Goal set today Pt will Transfer Bed to Chair/Chair to Bed: with supervision (with use of RW) PT Transfer Goal: Bed to Chair/Chair to Bed - Progress: Goal set today Pt will Ambulate: 51 - 150 feet;with supervision;with rolling walker PT Goal: Ambulate - Progress: Goal set today  Visit Information  Last PT Received On: 01/13/13 Assistance Needed: +1    Subjective Data  Subjective: Pt recieved in bed, agreeable to PT   Prior Functioning  Home Living Lives With: Alone ([facility], assisted living) Available Help at Discharge:  (through skilled part of [facility], has 24/7) Type of Home: Independent living facility Home Access: Level entry Home Layout: One level Bathroom Shower/Tub: Engineer, manufacturing systems: Standard Bathroom Accessibility: Yes How Accessible: Accessible via walker Home Adaptive Equipment: Straight  cane (sink right next to bathroom) Additional Comments: pt reports she has a  cleaning lady 1x/month Prior Function Level of Independence: Independent with assistive device(s) Driving: No Vocation: Retired Comments: pt was independent PTA Communication Communication: HOH Dominant Hand: Right    Cognition  Cognition Arousal/Alertness: Awake/alert Behavior During Therapy: WFL for tasks assessed/performed Overall Cognitive Status: Within Functional Limits for tasks assessed    Extremity/Trunk Assessment Right Upper Extremity Assessment RUE ROM/Strength/Tone: WFL for tasks assessed Left Upper Extremity Assessment LUE ROM/Strength/Tone: WFL for tasks assessed Right Lower Extremity Assessment RLE ROM/Strength/Tone: Deficits RLE ROM/Strength/Tone Deficits: generalized weakness, grossly 4/5 Left Lower Extremity Assessment LLE ROM/Strength/Tone: Deficits LLE ROM/Strength/Tone Deficits: generalized weakness, grossly 4/5 Trunk Assessment Trunk Assessment: Normal   Balance Balance Balance Assessed: Yes Static Sitting Balance Static Sitting - Balance Support: Bilateral upper extremity supported;Feet supported Static Sitting - Level of Assistance: 7: Independent Static Sitting - Comment/# of Minutes: 3  End of Session PT - End of Session Equipment Utilized During Treatment: Gait belt Activity Tolerance: Patient tolerated treatment well Patient left: in chair;with call bell/phone within reach Nurse Communication: Mobility status  GP     Marcene Brawn 01/13/2013, 9:13 AM  Lewis Shock, PT, DPT Pager #: [phone] Office #: [phone]

## 2013-01-14 DIAGNOSIS — I059 Rheumatic mitral valve disease, unspecified: Secondary | ICD-10-CM

## 2013-01-14 LAB — PROTIME-INR
INR: 2.46 — ABNORMAL HIGH (ref 0.00–1.49)
Prothrombin Time: 25.5 seconds — ABNORMAL HIGH (ref 11.6–15.2)

## 2013-01-14 MED ORDER — LEVOFLOXACIN 750 MG PO TABS: 750.0000 mg | ORAL_TABLET | ORAL | Status: AC

## 2013-01-14 MED ORDER — AMLODIPINE BESYLATE 2.5 MG PO TABS: 2.5000 mg | ORAL_TABLET | Freq: Every day | ORAL | Status: DC

## 2013-01-14 MED ORDER — LEVOFLOXACIN 750 MG PO TABS
750.0000 mg | ORAL_TABLET | ORAL | Status: DC
  Administered 2013-01-15: 750 mg via ORAL
  Filled 2013-01-14: qty 1

## 2013-01-14 MED ORDER — BENAZEPRIL HCL 40 MG PO TABS: 20.0000 mg | ORAL_TABLET | Freq: Two times a day (BID) | ORAL | Status: DC

## 2013-01-14 MED ORDER — WARFARIN SODIUM 2.5 MG PO TABS
2.5000 mg | ORAL_TABLET | Freq: Once | ORAL | Status: AC
  Administered 2013-01-14: 2.5 mg via ORAL
  Filled 2013-01-14: qty 1

## 2013-01-14 MED ORDER — ALBUTEROL SULFATE (5 MG/ML) 0.5% IN NEBU
2.5000 mg | INHALATION_SOLUTION | Freq: Three times a day (TID) | RESPIRATORY_TRACT | Status: DC
  Administered 2013-01-14 – 2013-01-15 (×3): 2.5 mg via RESPIRATORY_TRACT
  Filled 2013-01-14 (×5): qty 0.5

## 2013-01-14 NOTE — Progress Notes (Signed)
Agree with PT treatment note.  Danaisha Celli, PT DPT [phone]  

## 2013-01-14 NOTE — Progress Notes (Signed)
Physical Therapy Treatment Patient Details Name: Rose Weber MRN: [id number] DOB: 1912/12/22 Today's Date: 01/14/2013 Time: 4696-2952 PT Time Calculation (min): 19 min  PT Assessment / Plan / Recommendation Comments on Treatment Session  Pt is a 79 y o famle with CHF who is legally blind. Pt continues to require mod assistance to stand from the chair and v/c's for transfer sequencing. Pt continues to ambulate more safely with RW and would continue to need 24/7 assist for ADLs. Recommending ST-SNF to improve transfer independence, safety with ambulation, and ADL independence prior to d/c back to independent living.       Follow Up Recommendations  SNF;Supervision/Assistance - 24 hour     Does the patient have the potential to tolerate intense rehabilitation     Barriers to Discharge        Equipment Recommendations  Rolling walker with 5" wheels    Recommendations for Other Services    Frequency Min 3X/week   Plan Discharge plan remains appropriate    Precautions / Restrictions Precautions Precautions: Fall Precaution Comments: Pt with poor vision, legally blind Restrictions Weight Bearing Restrictions: No   Pertinent Vitals/Pain Pt denies pain    Mobility  Transfers Transfers: Sit to Stand;Stand to Sit Sit to Stand: 3: Mod assist;With upper extremity assist;From chair/3-in-1;With armrests (to RW) Stand to Sit: 4: Min assist;With upper extremity assist;To chair/3-in-1 Ambulation/Gait Ambulation/Gait Assistance: 4: Min guard (to guide walker due to poor vision) Ambulation Distance (Feet): 150 Feet Assistive device: Rolling walker Ambulation/Gait Assistance Details: Pt reports she feels safer with RW. Pt with good ambulation technique with RW but required v/c's for sequencing with turn Gait Pattern: Step-through pattern;Decreased stride length Gait velocity: wfl for age General Gait Details: No LOB with RW Stairs: No    Exercises General Exercises - Lower  Extremity Ankle Circles/Pumps: 5 reps;Both;Seated;AROM (educated patient, pt w/ good teach back) Long Arc Quad: 5 reps;Both;AROM;Seated   PT Diagnosis:    PT Problem List:   PT Treatment Interventions:     PT Goals Acute Rehab PT Goals PT Goal Formulation: With patient Time For Goal Achievement: 01/27/13 Potential to Achieve Goals: Good PT Goal: Sit to Stand - Progress: Progressing toward goal PT Transfer Goal: Bed to Chair/Chair to Bed - Progress: Progressing toward goal PT Goal: Ambulate - Progress: Progressing toward goal  Visit Information  Last PT Received On: 01/14/13 Assistance Needed: +1    Subjective Data  Subjective: Pt recieved in chair agreeable to ambulation with PT   Cognition  Cognition Arousal/Alertness: Awake/alert Behavior During Therapy: WFL for tasks assessed/performed Overall Cognitive Status: Within Functional Limits for tasks assessed    Balance     End of Session PT - End of Session Equipment Utilized During Treatment: Gait belt Activity Tolerance: Patient tolerated treatment well Patient left: in chair;with call bell/phone within reach   GP     01/14/2013, 5:10 PM Marvis Moeller, Student Physical Therapist Office #: [phone]

## 2013-01-14 NOTE — Progress Notes (Signed)
INITIAL NUTRITION ASSESSMENT  DOCUMENTATION CODES Per approved criteria  -Not Applicable   INTERVENTION: 1. Recommend Ensure Complete po BID, each supplement provides 350 kcal and 13 grams of protein (pt refusing hospital supplements, will drink her own at home)  NUTRITION DIAGNOSIS: Inadequate oral intake related to poor appetite as evidenced by weight loss.   Goal: PO intake to meet >/=90% estimated nutrition needs.   Monitor:  PO intake, weight trends, labs   Reason for Assessment: Health History  79 y.o. female  Admitting Dx: Respiratory failure, acute  ASSESSMENT: Pt admitted with increased SOB and fever, found with PNA.  RD pulled to chart for low weight/BMI. Pt endorsed poor oral intake PTA. Denies any weight loss.  Weight hx shows weight loss of 6 lbs (6% body weight) in 3 months. Not significant in time frame. Pt drinks Ensure at home, will not drink any here. States she has too many allergies and doesn't want to take any chances. Possible d/c today per pt. Encouraged increased intake and Ensure at home for weight maintenance.   Height: Ht Readings from Last 1 Encounters:  01/12/13 5\' 1"  (1.549 m)    Weight: Wt Readings from Last 1 Encounters:  01/14/13 93 lb 6.4 oz (42.366 kg)    Ideal Body Weight: 105 lbs   % Ideal Body Weight: 89%  Wt Readings from Last 10 Encounters:  01/14/13 93 lb 6.4 oz (42.366 kg)  09/24/12 99 lb (44.906 kg)  07/09/12 102 lb (46.267 kg)  02/29/12 99 lb 3.2 oz (44.997 kg)  11/09/11 101 lb 6.4 oz (45.995 kg)  08/10/11 106 lb (48.081 kg)  05/17/11 101 lb (45.813 kg)  08/09/10 104 lb (47.174 kg)  02/16/11 98 lb (44.453 kg)  11/09/10 101 lb 6.4 oz (45.995 kg)    Usual Body Weight: 101 lbs   % Usual Body Weight: 92%  BMI:  Body mass index is 17.66 kg/(m^2). Underweight   Estimated Nutritional Needs: Kcal: 1200-1400 Protein: 50-60 gm  Fluid: >/= 1.2 L   Skin: intact   Diet Order: Cardiac  EDUCATION NEEDS: -No education  needs identified at this time   Intake/Output Summary (Last 24 hours) at 01/14/13 1000 Last data filed at 01/14/13 0900  Gross per 24 hour  Intake    850 ml  Output    950 ml  Net   -100 ml    Last BM: 5/27   Labs:   Recent Labs Lab 01/10/13 1212 01/10/13 1213 01/11/13 0640 01/12/13 0837  NA 136  --  136 134*  K 3.6  --  3.6 3.6  CL 98  --  101 98  CO2 22  --  25 27  BUN 17  --  16 20  CREATININE 0.52  --  0.53 0.54  CALCIUM 8.8  --  8.2* 8.3*  MG  --  1.8  --   --   GLUCOSE 145*  --  154* 114*    CBG (last 3)  No results found for this basename: GLUCAP,  in the last 72 hours  Scheduled Meds: . albuterol  2.5 mg Nebulization TID  . amLODipine  2.5 mg Oral Daily  . antiseptic oral rinse  15 mL Mouth Rinse BID  . artificial tears   Both Eyes QHS  . benazepril  20 mg Oral BID  . calcium carbonate  1 tablet Oral Daily  . carboxymethylcellulose  1 drop Both Eyes TID  . cholecalciferol  2,000 Units Oral Daily  . furosemide  40  mg Oral Daily  . [START ON 01/15/2013] levofloxacin  750 mg Oral Q48H  . levothyroxine  25 mcg Oral QAC breakfast  . potassium chloride SA  20 mEq Oral Daily  . prednisoLONE acetate  1 drop Right Eye TID   And  . prednisoLONE acetate  1 drop Left Eye Daily  . timolol  1 drop Both Eyes BID  . warfarin  2.5 mg Oral ONCE-1800  . Warfarin - Pharmacist Dosing Inpatient   Does not apply q1800    Continuous Infusions:   Past Medical History  Diagnosis Date  . HTN (hypertension)   . MVP (mitral valve prolapse)   . Mitral insufficiency     SEVERE  . Chronic anticoagulation   . Edema   . GERD (gastroesophageal reflux disease)   . Personal history of goiter   . Hypothyroidism   . CHF (congestive heart failure)   . Mitral valve prolapse     With severe mitral insufficiency  . Atrial fibrillation   . Goiter     History of goiter    Past Surgical History  Procedure Laterality Date  . Cardioversion  04/14/10  . Appendectomy    . Small  intestine surgery      SBO 2000  . Abdominal hysterectomy    . Corneal transplant      MULTIPLE  . Rectal polypectomy      Clarene Duke RD, LDN Pager [phone] After Hours pager [phone]

## 2013-01-14 NOTE — Progress Notes (Signed)
   TELEMETRY: Reviewed telemetry pt in atrial fibrillation: Filed Vitals:   01/13/13 2137 01/13/13 2214 01/14/13 0147 01/14/13 0426  BP:  107/48  108/59  Pulse:  62  71  Temp:  97.3 F (36.3 C)  97.5 F (36.4 C)  TempSrc:  Oral  Oral  Resp:  16  17  Height:      Weight:    93 lb 6.4 oz (42.366 kg)  SpO2: 93% 95% 95% 94%    Intake/Output Summary (Last 24 hours) at 01/14/13 0736 Last data filed at 01/14/13 0429  Gross per 24 hour  Intake    610 ml  Output    950 ml  Net   -340 ml    SUBJECTIVE Feels much better. Anxious to go home. Still coughing some but improved. No fever, chills.  Less SOB.   LABS: Basic Metabolic Panel:  Recent Labs  16/10/96 0837  NA 134*  K 3.6  CL 98  CO2 27  GLUCOSE 114*  BUN 20  CREATININE 0.54  CALCIUM 8.3*    Radiology/Studies:  Dg Chest Port 1 View  01/11/2013   *RADIOLOGY REPORT*  Clinical Data: Pneumonia, cough  PORTABLE CHEST - 1 VIEW  Comparison: 01/10/2013  Findings: Worsened bibasilar airspace opacities noted with trace pleural effusion.  Cardiomegaly persists.  Aortic calcification again noted.  There is also ill-defined left greater than right patchy upper lobe airspace opacity.  IMPRESSION: Worsening multilobar airspace opacities which could represent pneumonia, although other alveolar filling processes such as hemorrhage, asymmetric edema, or aspiration could have a similar appearance.  Radiographic resolution of pneumonia may take 4-6 weeks.   Original Report Authenticated By: Christiana Pellant, M.D.   Dg Chest Portable 1 View  01/10/2013   *RADIOLOGY REPORT*  Clinical Data: Respiratory distress  PORTABLE CHEST - 1 VIEW  Comparison: 03/03/2010  Findings: Hyperexpansion is consistent with emphysema.  Patchy airspace disease is seen in the left upper lung and in the right base, suggesting pneumonia. The cardiopericardial silhouette is enlarged. Bones are diffusely demineralized. Telemetry leads overlie the chest.  IMPRESSION:  Cardiomegaly with patchy bilateral airspace disease suggesting pneumonia.  Follow-up imaging is recommended to ensure resolution.   Original Report Authenticated By: Kennith Center, M.D.    PHYSICAL EXAM General: Well developed, elderly, in no acute distress. Head: Normal Neck: Negative for carotid bruits. JVD not elevated. Lungs: Coarse rhonchi bilateral L>R Heart: IRRR S1 S2 with grade 4/6 systolic murmur at apex across precordium Abdomen: Soft, non-tender, non-distended with normoactive bowel sounds. No hepatomegaly. Msk:  Strength and tone appears normal for age. Extremities: No clubbing, cyanosis or edema.  Distal pedal pulses are 2+ and equal bilaterally. Neuro: Alert and oriented X 3. Moves all extremities spontaneously. Psych:  Responds to questions appropriately with a normal affect.  ASSESSMENT AND PLAN: 1. Atrial fibrillation. Rate well controlled. Anticoagulated with coumadin.  2. Strep Pneumonia. Improved with antibiotics.  3. CHF chronic secondary to severe MR, on po lasix.  4. MVP with chronic severe MR.  Plan: patient stable for discharge from a cardiac standpoint. Will arrange cardiac follow up.  Principal Problem:   Respiratory failure, acute Active Problems:   Atrial fibrillation   CHF (congestive heart failure)   MVP (mitral valve prolapse)   HTN (hypertension)   Chronic anticoagulation   GERD (gastroesophageal reflux disease)   Hypothyroidism   Streptococcal pneumonia    Signed, Ahmani Daoud [place] MD,FACC 01/14/2013 7:42 AM

## 2013-01-14 NOTE — Progress Notes (Signed)
01/14/13 1133 Noted PT rec pt. dc to SNF for ST rehab.  Presently pt. resides at [facility] in Assisted living.  Consulted CSW for ALF, and made Lupita Leash, CSW aware.   Tera Mater, RN, BSN NCM [phone]

## 2013-01-14 NOTE — Discharge Summary (Signed)
Physician Discharge Summary  Rose Weber QIO:[id number] DOB: [birth year] DOA: 01/10/2013  PCP: Kimber Relic, MD  Admit date: 01/10/2013 Discharge date: 01/14/2013  Time spent: >30 minutes  Recommendations for Outpatient Follow-up:  BMET to follow renal function and electrolytes Reassess BP and adjust medications as needed  Discharge Diagnoses:  Principal Problem:   Respiratory failure, acute Active Problems:   HTN (hypertension)   MVP (mitral valve prolapse)   Chronic anticoagulation   Atrial fibrillation   GERD (gastroesophageal reflux disease)   Hypothyroidism   CHF (congestive heart failure)   Streptococcal pneumonia   Discharge Condition: stable and improved. Will d/c to SNF for physical rehabilitation.  Diet recommendation: heart healthy low sodium diet  Filed Weights   01/12/13 1824 01/13/13 0537 01/14/13 0426  Weight: 42.7 kg (94 lb 2.2 oz) 42.8 kg (94 lb 5.7 oz) 42.366 kg (93 lb 6.4 oz)    History of present illness:  79 year old female with history of hypertension, mitral regurgitation, A. fib on Coumadin, history of CHF , hypothyroidism with history of goiter, legally blind who is a resident of friend's home was sent to the ED with shortness of breath with increased work of breathing, tachypnea and hypoxia this morning. As per patient she was having cough with shortness of breath for last 4 days. She denies having fever or chills, nausea, vomiting, chest pain, palpitations, orthopnea or PND. She denies having abdominal pain, bowel or urinary symptoms. She does inform poor appetite since 4 days. Patient is able to ambulate with some support.   Hospital Course:  Strep Pneumonia  -patient received treatment with IV Vanc and Cefepime and Azactam for 3 days; with subsequent transition to levaquin to complete antibiotic therapy. -now off O2 and breathing comfortable  -will follow with PCP in 10 days -discharge to SNF for physical rehabilitation after PNA.  CHF   -cardio has changed Lasix to oral (20mg  daily) -negative balance by about 2500cc  - cont to monitor I/O. and daily weight  -low sodium diet  HTN (hypertension)  BP soft but stable. -antihypertensive agents adjusted to avoid hypotension.  -follow up with PCP in 10 days  MVP (mitral valve prolapse)  -with associated severe mitral regurgitation. Follows with Dr. Swaziland with [facility]   Atrial fibrillation  Cont Coumadin. INR therapeutic.  -continue amlodipine   Hypothyroidism  Continue Synthroid  TSH normal   Vtach  -Patient noted to have sustained v tach shortly after admission  -Low k noted. Replenished and [employer] cardiology consulted- no further treatment recommended.  -HR stable since  Physical deconditioning: -will discharge to SNF for physical rehab  *Rest of medical problems remains stable and the plan is to continue current home medication regimen and follow up with PCP  Procedures: None  Consultations:  cardiology  Discharge Exam: Filed Vitals:   01/13/13 2214 01/14/13 0147 01/14/13 0426 01/14/13 1153  BP: 107/48  108/59 111/46  Pulse: 62  71 62  Temp: 97.3 F (36.3 C)  97.5 F (36.4 C)   TempSrc: Oral  Oral   Resp: 16  17   Height:      Weight:   42.366 kg (93 lb 6.4 oz)   SpO2: 95% 95% 94%    General: No acute respiratory distress; feeling better and afebrile  Lungs: no crackles; positive rhonchi  Cardiovascular: S1 and S2, no rubs or gallops, positive murmur Abdomen: Non-tender, nondistended, soft, bowel sounds positive, no rebound, no ascites, no appreciable mass  Extremities: No significant cyanosis, clubbing, or  edema bilateral lower extremities   Discharge Instructions  Discharge Orders   Future Appointments Provider Department Dept Phone   01/30/2013 1:30 PM Peter M Swaziland, MD [facility] Main Office [place]) [phone]   03/20/2013 10:45 AM Peter M Swazil[place], MD [facility] Main Office [place]) [phone]   Future  Orders Complete By Expires     Diet - low sodium heart healthy  As directed     Discharge instructions  As directed     Comments:      Take medications as prescribed Follow a low sodium diet Arrange follow up with PCP in 10 days        Medication List    TAKE these medications       amLODipine 2.5 MG tablet  Commonly known as:  NORVASC  Take 1 tablet (2.5 mg total) by mouth daily.     benazepril 40 MG tablet  Commonly known as:  LOTENSIN  Take 0.5 tablets (20 mg total) by mouth 2 (two) times daily.     bismuth subsalicylate 262 MG/15ML suspension  Commonly known as:  PEPTO BISMOL  Take 15 mLs by mouth as needed for indigestion.     calcium carbonate 600 MG Tabs  Commonly known as:  OS-CAL  Take 600 mg by mouth daily.     carboxymethylcellulose 0.5 % Soln  Commonly known as:  REFRESH PLUS  Place 1 drop into both eyes as needed (dry eyes).     cycloSPORINE 0.05 % ophthalmic emulsion  Commonly known as:  RESTASIS  Apply 1 drop to eye 3 (three) times daily.     estrogens (conjugated) 0.3 MG tablet  Commonly known as:  PREMARIN  Take 0.3 mg by mouth See admin instructions. Take daily for 21 days then do not take for 7 days.     furosemide 20 MG tablet  Commonly known as:  LASIX  Take 20 mg by mouth See admin instructions. 20mg  daily and may take additional dose for edema if needed     levofloxacin 750 MG tablet  Commonly known as:  LEVAQUIN  Take 1 tablet (750 mg total) by mouth every other day.  Start taking on:  01/15/2013     levothyroxine 25 MCG tablet  Commonly known as:  SYNTHROID, LEVOTHROID  Take 25 mcg by mouth daily.     potassium chloride SA 20 MEQ tablet  Commonly known as:  K-DUR,KLOR-CON  Take 20 mEq by mouth daily.     prednisoLONE acetate 1 % ophthalmic suspension  Commonly known as:  PRED FORTE  Apply 1 drop to eye See admin instructions. Right eye three times a day; left eye daily     REFRESH LACRI-LUBE Oint  Apply 1 application to eye at  bedtime.     timolol 0.5 % ophthalmic solution  Commonly known as:  TIMOPTIC  Place 1 drop into both eyes 2 (two) times daily.     Vitamin D 2000 UNITS tablet  Take 2,000 Units by mouth daily.     warfarin 2.5 MG tablet  Commonly known as:  COUMADIN  Take 2.5-5 mg by mouth daily. 2.5mg  Tuesday, Thursday, Saturday; 5mg  the rest of the week       Allergies  Allergen Reactions  . Alphagan (Brimonidine Tartrate)   . Bystolic (Nebivolol Hcl)   . Diovan (Valsartan)   . Other     PRESERVATIVES  . Penicillins   . Polymyxin B-Trimethoprim   . Sulfa Drugs Cross Reactors   . Tekturna (Aliskiren Fumarate)   .  Travatan Z        Follow-up Information   Follow up with GREEN, Lenon Curt, MD. Schedule an appointment as soon as possible for a visit in 10 days.   Contact information:   [address] [place] [address] [phone]        The results of significant diagnostics from this hospitalization (including imaging, microbiology, ancillary and laboratory) are listed below for reference.    Significant Diagnostic Studies: Dg Chest Port 1 View  01/11/2013   *RADIOLOGY REPORT*  Clinical Data: Pneumonia, cough  PORTABLE CHEST - 1 VIEW  Comparison: 01/10/2013  Findings: Worsened bibasilar airspace opacities noted with trace pleural effusion.  Cardiomegaly persists.  Aortic calcification again noted.  There is also ill-defined left greater than right patchy upper lobe airspace opacity.  IMPRESSION: Worsening multilobar airspace opacities which could represent pneumonia, although other alveolar filling processes such as hemorrhage, asymmetric edema, or aspiration could have a similar appearance.  Radiographic resolution of pneumonia may take 4-6 weeks.   Original Report Authenticated By: Christiana Pellant, M.D.   Dg Chest Portable 1 View  01/10/2013   *RADIOLOGY REPORT*  Clinical Data: Respiratory distress  PORTABLE CHEST - 1 VIEW  Comparison: 03/03/2010  Findings: Hyperexpansion is  consistent with emphysema.  Patchy airspace disease is seen in the left upper lung and in the right base, suggesting pneumonia. The cardiopericardial silhouette is enlarged. Bones are diffusely demineralized. Telemetry leads overlie the chest.  IMPRESSION: Cardiomegaly with patchy bilateral airspace disease suggesting pneumonia.  Follow-up imaging is recommended to ensure resolution.   Original Report Authenticated By: Kennith Center, M.D.    Microbiology: Recent Results (from the past 240 hour(s))  CULTURE, BLOOD (ROUTINE X 2)     Status: None   Collection Time    01/10/13  1:20 PM      Result Value Range Status   Specimen Description BLOOD LEFT ARM   Final   Special Requests BOTTLES DRAWN AEROBIC AND ANAEROBIC R 7CC B 10CC   Final   Culture  Setup Time 01/11/2013 00:05   Final   Culture     Final   Value:        BLOOD CULTURE RECEIVED NO GROWTH TO DATE CULTURE WILL BE HELD FOR 5 DAYS BEFORE ISSUING A FINAL NEGATIVE REPORT   Report Status PENDING   Incomplete  CULTURE, BLOOD (ROUTINE X 2)     Status: None   Collection Time    01/10/13  1:30 PM      Result Value Range Status   Specimen Description BLOOD RIGHT ARM   Final   Special Requests BOTTLES DRAWN AEROBIC AND ANAEROBIC 10CC   Final   Culture  Setup Time 01/11/2013 00:05   Final   Culture     Final   Value:        BLOOD CULTURE RECEIVED NO GROWTH TO DATE CULTURE WILL BE HELD FOR 5 DAYS BEFORE ISSUING A FINAL NEGATIVE REPORT   Report Status PENDING   Incomplete  MRSA PCR SCREENING     Status: None   Collection Time    01/10/13  5:10 PM      Result Value Range Status   MRSA by PCR NEGATIVE  NEGATIVE Final   Comment:            The GeneXpert MRSA Assay (FDA     approved for NASAL specimens     only), is one component of a     comprehensive MRSA colonization  surveillance program. It is not     intended to diagnose MRSA     infection nor to guide or     monitor treatment for     MRSA infections.     Labs: Basic Metabolic  Panel:  Recent Labs Lab 01/10/13 1212 01/10/13 1213 01/11/13 0640 01/12/13 0837  NA 136  --  136 134*  K 3.6  --  3.6 3.6  CL 98  --  101 98  CO2 22  --  25 27  GLUCOSE 145*  --  154* 114*  BUN 17  --  16 20  CREATININE 0.52  --  0.53 0.54  CALCIUM 8.8  --  8.2* 8.3*  MG  --  1.8  --   --    Liver Function Tests:  Recent Labs Lab 01/10/13 1212  AST 57*  ALT 29  ALKPHOS 107  BILITOT 1.5*  PROT 6.9  ALBUMIN 3.4*   CBC:  Recent Labs Lab 01/10/13 1212 01/11/13 0640  WBC 6.2 7.8  NEUTROABS 4.8 6.3  HGB 14.5 13.8  HCT 43.2 39.1  MCV 91.9 89.5  PLT 144* 138*   BNP: BNP (last 3 results)  Recent Labs  01/10/13 1213  PROBNP 7369.0*    Signed:  Wojciech Willetts  [facility] 01/14/2013, 12:21 PM

## 2013-01-14 NOTE — Progress Notes (Signed)
ANTICOAGULATION CONSULT NOTE - Follow Up Consult  Pharmacy Consult for warfarin Indication: atrial fibrillation  Allergies  Allergen Reactions  . Alphagan (Brimonidine Tartrate)   . Bystolic (Nebivolol Hcl)   . Diovan (Valsartan)   . Other     PRESERVATIVES  . Penicillins   . Polymyxin B-Trimethoprim   . Sulfa Drugs Cross Reactors   . Tekturna (Aliskiren Fumarate)   . Travatan Z    Labs:  Recent Labs  01/12/13 0455 01/12/13 0837 01/13/13 0423 01/14/13 0710  LABPROT 26.3*  --  20.7* 25.5*  INR 2.56*  --  1.85* 2.46*  CREATININE  --  0.54  --   --     Estimated Creatinine Clearance: 26.3 ml/min (by C-G formula based on Cr of 0.54).   Assessment: 89 y/oF on Coumadin pta for afib continuing on admission. PTA dose was 5 mg daily except 2.5 mg on TTSat.  INR increased to 2.48 today.  No bleeding noted.   Goal of Therapy:  INR 2-3 Monitor platelets by anticoagulation protocol: Yes   Plan:  Warfarin 2.5 mg x1 po tonight Daily INR Levaquin to po   Thank you. Okey Regal, PharmD [phone]  01/14/2013 8:16 AM

## 2013-01-15 LAB — PROTIME-INR: INR: 3.55 — ABNORMAL HIGH (ref 0.00–1.49)

## 2013-01-15 MED ORDER — WARFARIN SODIUM 2.5 MG PO TABS: 2.5000 mg | ORAL_TABLET | Freq: Every day | ORAL | Status: DC

## 2013-01-15 NOTE — Progress Notes (Signed)
ANTICOAGULATION CONSULT NOTE - Follow Up Consult  Pharmacy Consult for warfarin Indication: atrial fibrillation  Allergies  Allergen Reactions  . Alphagan (Brimonidine Tartrate)   . Bystolic (Nebivolol Hcl)   . Diovan (Valsartan)   . Other     PRESERVATIVES  . Penicillins   . Polymyxin B-Trimethoprim   . Sulfa Drugs Cross Reactors   . Tekturna (Aliskiren Fumarate)   . Travatan Z    Labs:  Recent Labs  01/13/13 0423 01/14/13 0710 01/15/13 0440  LABPROT 20.7* 25.5* 33.5*  INR 1.85* 2.46* 3.55*    Estimated Creatinine Clearance: 26 ml/min (by C-G formula based on Cr of 0.54).   Assessment: 89 y/oF on Coumadin pta for afib continuing on admission. PTA dose was 5 mg daily except 2.5 mg on TTSat.  INR increased to 3.55 today.  No bleeding noted.   Goal of Therapy:  INR 2-3 Monitor platelets by anticoagulation protocol: Yes   Plan:  No Coumadin today Daily INR Levaquin to po   Thank you. Okey Regal, PharmD [phone]  01/15/2013 8:37 AM

## 2013-01-15 NOTE — Progress Notes (Signed)
Physical Therapy Treatment Patient Details Name: CEDRICA BRUNE MRN: [id number] DOB: 07-22-1913 Today's Date: 01/15/2013 Time: 4098-1191 PT Time Calculation (min): 74 min  PT Assessment / Plan / Recommendation Comments on Treatment Session  Pt making progress with mobility at this date.  Required decreased (A) to achieve standing.  Overall pt is steady with use of RW.  Cont to recommend SNF at d/c to maximize independence with mobility due to she was at Independent level PTA.      Follow Up Recommendations  SNF;Supervision/Assistance - 24 hour     Does the patient have the potential to tolerate intense rehabilitation     Barriers to Discharge        Equipment Recommendations  Rolling walker with 5" wheels    Recommendations for Other Services    Frequency Min 3X/week   Plan Discharge plan remains appropriate    Precautions / Restrictions Precautions Precautions: Fall Restrictions Weight Bearing Restrictions: No       Mobility  Bed Mobility Bed Mobility: Not assessed Transfers Transfers: Sit to Stand;Stand to Sit Sit to Stand: 4: Min assist;With upper extremity assist;With armrests;From chair/3-in-1 Stand to Sit: 4: Min guard;With upper extremity assist;With armrests;To chair/3-in-1 Details for Transfer Assistance: cues for hand placement & technique.  (A) for balance & safety.   Ambulation/Gait Ambulation/Gait Assistance: 4: Min guard Ambulation Distance (Feet): 200 Feet Assistive device: Rolling walker Ambulation/Gait Assistance Details: Steady with RW.  Ocassional cueing to stay inside RW but overall good technique with RW.   Gait Pattern: Step-through pattern;Decreased stride length Gait velocity: wfl for age General Gait Details: No LOB with RW Stairs: No Wheelchair Mobility Wheelchair Mobility: No    Exercises General Exercises - Lower Extremity Ankle Circles/Pumps: AROM;Both;10 reps Long Arc Quad: Both;10 reps Hip Flexion/Marching: Both;10 reps Toe Raises:  Both;10 reps Heel Raises: Both;10 reps     PT Goals Acute Rehab PT Goals Time For Goal Achievement: 01/27/13 Potential to Achieve Goals: Good Pt will go Sit to Stand: with modified independence;with upper extremity assist PT Goal: Sit to Stand - Progress: Progressing toward goal Pt will Transfer Bed to Chair/Chair to Bed: with supervision Pt will Ambulate: 51 - 150 feet;with supervision;with rolling walker PT Goal: Ambulate - Progress: Progressing toward goal  Visit Information  Last PT Received On: 01/15/13 Assistance Needed: +1    Subjective Data      Cognition  Cognition Arousal/Alertness: Awake/alert Behavior During Therapy: WFL for tasks assessed/performed Overall Cognitive Status: Within Functional Limits for tasks assessed    Balance     End of Session PT - End of Session Equipment Utilized During Treatment: Gait belt Activity Tolerance: Patient tolerated treatment well Patient left: in chair;with call bell/phone within reach Nurse Communication: Mobility status     Verdell Face, [place] [phone] 01/15/2013

## 2013-01-15 NOTE — Progress Notes (Signed)
DC IV, DC Tele, DC to SNF. Discharge instructions and home medications discussed with patient and patient's friend. Patient and friend denied any questions or concerns at this time. Patient leaving unit via wheelchair and appears in no acute distress.

## 2013-01-15 NOTE — Discharge Summary (Signed)
Patient remains stable for discharge.    See discharge summary from Dr. Gwenlyn Perking done 01/14/2013.   Lungs:  Occasional rhonchi. No wheezes or rales. Appears comfortable. INR today is 3.55. At discharge, recommend holding Coumadin today, then decrease dose to 2-1/2 mg daily, monitor INR and adjust as needed to keep within therapeutic range. See below for final discharge medications.    Medication List    TAKE these medications       amLODipine 2.5 MG tablet  Commonly known as:  NORVASC  Take 1 tablet (2.5 mg total) by mouth daily.     benazepril 40 MG tablet  Commonly known as:  LOTENSIN  Take 0.5 tablets (20 mg total) by mouth 2 (two) times daily.     bismuth subsalicylate 262 MG/15ML suspension  Commonly known as:  PEPTO BISMOL  Take 15 mLs by mouth as needed for indigestion.     calcium carbonate 600 MG Tabs  Commonly known as:  OS-CAL  Take 600 mg by mouth daily.     carboxymethylcellulose 0.5 % Soln  Commonly known as:  REFRESH PLUS  Place 1 drop into both eyes as needed (dry eyes).     cycloSPORINE 0.05 % ophthalmic emulsion  Commonly known as:  RESTASIS  Apply 1 drop to eye 3 (three) times daily.     estrogens (conjugated) 0.3 MG tablet  Commonly known as:  PREMARIN  Take 0.3 mg by mouth See admin instructions. Take daily for 21 days then do not take for 7 days.     furosemide 20 MG tablet  Commonly known as:  LASIX  Take 20 mg by mouth See admin instructions. 20mg  daily and may take additional dose for edema if needed     levofloxacin 750 MG tablet  Commonly known as:  LEVAQUIN  Take 1 tablet (750 mg total) by mouth every other day.     levothyroxine 25 MCG tablet  Commonly known as:  SYNTHROID, LEVOTHROID  Take 25 mcg by mouth daily.     potassium chloride SA 20 MEQ tablet  Commonly known as:  K-DUR,KLOR-CON  Take 20 mEq by mouth daily.     prednisoLONE acetate 1 % ophthalmic suspension  Commonly known as:  PRED FORTE  Apply 1 drop to eye See admin  instructions. Right eye three times a day; left eye daily     REFRESH LACRI-LUBE Oint  Apply 1 application to eye at bedtime.     timolol 0.5 % ophthalmic solution  Commonly known as:  TIMOPTIC  Place 1 drop into both eyes 2 (two) times daily.     Vitamin D 2000 UNITS tablet  Take 2,000 Units by mouth daily.     warfarin 2.5 MG tablet  Commonly known as:  COUMADIN  Take 1 tablet (2.5 mg total) by mouth daily. Hold dose 5/28. Adjust to keep INR between 2.0 and 3.0.

## 2013-01-16 ENCOUNTER — Non-Acute Institutional Stay (SKILLED_NURSING_FACILITY): Payer: Medicare Other | Admitting: Nurse Practitioner

## 2013-01-16 DIAGNOSIS — I341 Nonrheumatic mitral (valve) prolapse: Secondary | ICD-10-CM

## 2013-01-16 DIAGNOSIS — E039 Hypothyroidism, unspecified: Secondary | ICD-10-CM

## 2013-01-16 DIAGNOSIS — I059 Rheumatic mitral valve disease, unspecified: Secondary | ICD-10-CM

## 2013-01-16 DIAGNOSIS — R609 Edema, unspecified: Secondary | ICD-10-CM

## 2013-01-16 DIAGNOSIS — I4891 Unspecified atrial fibrillation: Secondary | ICD-10-CM

## 2013-01-16 DIAGNOSIS — Z66 Do not resuscitate: Secondary | ICD-10-CM | POA: Insufficient documentation

## 2013-01-16 DIAGNOSIS — J154 Pneumonia due to other streptococci: Secondary | ICD-10-CM

## 2013-01-16 DIAGNOSIS — I10 Essential (primary) hypertension: Secondary | ICD-10-CM

## 2013-01-16 LAB — BASIC METABOLIC PANEL
Glucose: 82 mg/dL
Potassium: 4.1 mmol/L (ref 3.4–5.3)
Sodium: 138 mmol/L (ref 137–147)

## 2013-01-16 NOTE — Assessment & Plan Note (Addendum)
Takes Levothyroxine 25mcg. TSH normal.    

## 2013-01-16 NOTE — Assessment & Plan Note (Signed)
Trace in BLE--has prn Lasix 20mg  in addition to daily 20mg  for weight gain 2-3Ibs/24hrs or 3-5Ibs/week.

## 2013-01-16 NOTE — Assessment & Plan Note (Signed)
Rate controlled and takes Coumadin for PE/DVT prophylaxis.        

## 2013-01-16 NOTE — Assessment & Plan Note (Signed)
Last dose of Levaquin 01/18/13

## 2013-01-16 NOTE — Clinical Social Work Psychosocial (Addendum)
    Clinical Social Work Department BRIEF PSYCHOSOCIAL ASSESSMENT 01/16/2013  Patient:  Rose Weber, Rose Weber     Account Number:  [id number]     Admit date:  01/10/2013  Clinical Social Worker:  Tiburcio Pea  Date/Time:  01/13/2013 01:00 PM  Referred by:  Physician  Date Referred:  01/12/2013 Referred for  SNF Placement   Other Referral:   Interview type:  Other - See comment Other interview type:    PSYCHOSOCIAL DATA Living Status:  FACILITY Admitted from facility:  [facility] Level of care:  Independent Living Primary support name:  Helene Kelp Primary support relationship to patient:  FRIEND Degree of support available:   Strong support  Patient has a son but he lives out of town. 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    CURRENT CONCERNS Current Concerns  Post-Acute Placement   Other Concerns:    SOCIAL WORK ASSESSMENT / PLAN 79 year old female- resident of [facility] where she resides in [facility].  Patient is alert and oriented and states that she normally is quite independent. Physical Therapy is recommending short term SNF for strengthening and she agrees with this.  CSW contacted admissions at facility- they currently do not have a SNF bed and she may have to consider placement at [facility].  The other option is to accept a "private pay bed" until a SNF rehab bed opens at [facility]. Patient considered the options and would prefer to stay at [facility] and pay for the private room.  Notified Elsie Lincoln at facility. FL2 placed on chart for MD's signature.   Assessment/plan status:  Psychosocial Support/Ongoing Assessment of Needs Other assessment/ plan:   Information/referral to community resources:   None at this time.    PATIENT'S/FAMILY'S RESPONSE TO PLAN OF CARE: Patient is alert, oriented and extremely invovled in her care.  She states that she has a son whom she keeps in contact with but she  remains her own responsible party. Patient stated that she would keep him notified of d/c plan.  Patient was very pleasant and appreciative of CSW's assistance with temporary placement in a self-pay bed. CSW will monitor and assist with d/c when medically stable per MD.

## 2013-01-16 NOTE — Assessment & Plan Note (Signed)
Compensated clinically.  

## 2013-01-16 NOTE — Progress Notes (Signed)
Patient ID: Rose Weber, female   DOB: 02-Sep-1912, 79 y.o.   MRN: [id number] Code Status: DNR  Allergies  Allergen Reactions  . Alphagan (Brimonidine Tartrate)   . Bystolic (Nebivolol Hcl)   . Diovan (Valsartan)   . Other     PRESERVATIVES  . Penicillins   . Polymyxin B-Trimethoprim   . Sulfa Drugs Cross Reactors   . Tekturna (Aliskiren Fumarate)   . Travatan Z     Chief Complaint  Patient presents with  . Hospitalization Follow-up  . Medical Managment of Chronic Issues    HPI: Patient is a 79 y.o. female seen in the SNF at [facility] today for post hospitalization of PNA-will complete last dose of Levaquin 01/18/13. 79 year old female with hx of HTN, Afib on Coumadin, CHF, hypothyroidism with history of goiter, legally blind was admitted to hospital with PNA. Her presenting symptoms SOB with increased work of breathing, tachypnea and hypoxia, cough.  PNA was treated with IV Vanc and Cefepime and Azactam for 3 days, then transition to levaquin-last dose will be 01/18/13. Vtach--resolved-shortly after admission--low K corrected and Cardiology consulted.  Problem List Items Addressed This Visit   HTN (hypertension)     Blood pressure has been controlled on Norvasc 2.5mg , Lotensin 20mg , Lasix 20mg     MVP (mitral valve prolapse)     Associated with severe mitral regurgitation, f/u Dr. Particia Nearing with [facility]     Edema     Trace in BLE--has prn Lasix 20mg  in addition to daily 20mg  for weight gain 2-3Ibs/24hrs or 3-5Ibs/week.     Atrial fibrillation     Rate controlled and takes Coumadin for PE/DVT prophylaxis.     Hypothyroidism     Takes Levothyroxine . TSH normal.     Streptococcal pneumonia     Last dose of Levaquin 01/18/13    DNR (do not resuscitate) - Primary      Review of Systems:  Review of Systems  Constitutional: Negative for fever, chills, weight loss, malaise/fatigue and diaphoresis.  HENT: Positive for hearing loss. Negative for congestion, sore  throat, neck pain and ear discharge.   Eyes: Positive for blurred vision. Negative for double vision, photophobia, pain, discharge and redness.       Legally blind, had corneal transplants 4x the right and 2x the left.   Respiratory: Positive for cough (hacking). Negative for hemoptysis, sputum production, shortness of breath and wheezing.   Cardiovascular: Negative for chest pain, palpitations, orthopnea, claudication, leg swelling and PND.  Gastrointestinal: Negative for heartburn, nausea, vomiting, abdominal pain, diarrhea, constipation, blood in stool and melena.  Genitourinary: Negative for dysuria, urgency, frequency, hematuria and flank pain.  Musculoskeletal: Negative for myalgias, back pain, joint pain and falls.  Skin: Negative for itching and rash.  Neurological: Negative for dizziness, tingling, tremors, sensory change, speech change, focal weakness, seizures, loss of consciousness, weakness and headaches.  Endo/Heme/Allergies: Negative for environmental allergies and polydipsia. Bruises/bleeds easily.  Psychiatric/Behavioral: Negative for depression, hallucinations and memory loss. The patient is not nervous/anxious and does not have insomnia.      Past Medical History  Diagnosis Date  . HTN (hypertension)   . MVP (mitral valve prolapse)   . Mitral insufficiency     SEVERE  . Chronic anticoagulation   . Edema   . GERD (gastroesophageal reflux disease)   . Personal history of goiter   . Hypothyroidism   . CHF (congestive heart failure)   . Mitral valve prolapse     With severe mitral insufficiency  .  Atrial fibrillation   . Goiter     History of goiter   Past Surgical History  Procedure Laterality Date  . Cardioversion  04/14/10  . Appendectomy    . Small intestine surgery      SBO 2000  . Abdominal hysterectomy    . Corneal transplant      MULTIPLE  . Rectal polypectomy     Social History:   reports that she has never smoked. She does not have any smokeless  tobacco history on file. She reports that she does not drink alcohol or use illicit drugs.  Family History  Problem Relation Age of Onset  . Stroke Sister   . Hip fracture Sister     Medications: Patient's Medications  New Prescriptions   No medications on file  Previous Medications   AMLODIPINE (NORVASC) 2.5 MG TABLET    Take 1 tablet (2.5 mg total) by mouth daily.   ARTIFICIAL TEAR OINTMENT (REFRESH LACRI-LUBE) OINT    Apply 1 application to eye at bedtime.   BENAZEPRIL (LOTENSIN) 40 MG TABLET    Take 0.5 tablets (20 mg total) by mouth 2 (two) times daily.   BISMUTH SUBSALICYLATE (PEPTO BISMOL) 262 MG/15ML SUSPENSION    Take 15 mLs by mouth as needed for indigestion.    CALCIUM CARBONATE (OS-CAL) 600 MG TABS    Take 600 mg by mouth daily.   CARBOXYMETHYLCELLULOSE (REFRESH PLUS) 0.5 % SOLN    Place 1 drop into both eyes as needed (dry eyes).   CHOLECALCIFEROL (VITAMIN D) 2000 UNIT TABLET    Take 2,000 Units by mouth daily.     CYCLOSPORINE (RESTASIS) 0.05 % OPHTHALMIC EMULSION    Apply 1 drop to eye 3 (three) times daily.   ESTROGENS, CONJUGATED, (PREMARIN) 0.3 MG TABLET    Take 0.3 mg by mouth See admin instructions. Take daily for 21 days then do not take for 7 days.   FUROSEMIDE (LASIX) 20 MG TABLET    Take 20 mg by mouth See admin instructions. 20mg  daily and may take additional dose for edema if needed   LEVOFLOXACIN (LEVAQUIN) 750 MG TABLET    Take 1 tablet (750 mg total) by mouth every other day.   LEVOTHYROXINE (SYNTHROID, LEVOTHROID) 25 MCG TABLET    Take 25 mcg by mouth daily.     POTASSIUM CHLORIDE SA (K-DUR,KLOR-CON) 20 MEQ TABLET    Take 20 mEq by mouth daily.   PREDNISOLONE ACETATE (PRED FORTE) 1 % OPHTHALMIC SUSPENSION    Apply 1 drop to eye See admin instructions. Right eye three times a day; left eye daily   TIMOLOL (TIMOPTIC) 0.5 % OPHTHALMIC SOLUTION    Place 1 drop into both eyes 2 (two) times daily.    WARFARIN (COUMADIN) 2.5 MG TABLET    Take 1 tablet (2.5 mg total)  by mouth daily. Hold dose 5/28. Adjust to keep INR between 2.0 and 3.0.  Modified Medications   No medications on file  Discontinued Medications   No medications on file     Physical Exam: Physical Exam  Constitutional: She is oriented to person, place, and time. She appears well-developed and well-nourished. No distress.  HENT:  Head: Normocephalic and atraumatic.  Right Ear: External ear normal.  Left Ear: External ear normal.  Nose: Nose normal.  Mouth/Throat: Oropharynx is clear and moist. No oropharyngeal exudate.  Eyes: Conjunctivae and EOM are normal. Pupils are equal, round, and reactive to light. Right eye exhibits no discharge. Left eye exhibits no discharge. No  scleral icterus.  Corneal transplants.   Neck: Normal range of motion. Neck supple. No JVD present. No thyromegaly (goitor ) present.  Cardiovascular: Normal rate.  An irregular rhythm present.  Murmur heard.  Systolic murmur is present with a grade of 4/6  Pulmonary/Chest: Effort normal. No stridor. No respiratory distress. She has no wheezes. She has rales in the right lower field and the left lower field.  Abdominal: Soft. Bowel sounds are normal. She exhibits no distension. There is no tenderness. There is no rebound.  Musculoskeletal: Normal range of motion. She exhibits no edema and no tenderness.  Lymphadenopathy:    She has no cervical adenopathy.  Neurological: She is alert and oriented to person, place, and time. She has normal reflexes. She displays normal reflexes. No cranial nerve deficit. She exhibits normal muscle tone. Coordination normal.  Skin: Skin is warm and dry. No rash noted. She is not diaphoretic. No erythema.  Psychiatric: She has a normal mood and affect. Her behavior is normal. Judgment and thought content normal.    Filed Vitals:   01/16/13 1315  BP: 108/64  Pulse: 76  Temp: 96.6 F (35.9 C)  TempSrc: Tympanic  Resp: 16      Labs reviewed: Basic Metabolic Panel:  Recent  Labs  02/29/12 1552 01/10/13 1212 01/10/13 1213 01/10/13 1557 01/11/13 0640 01/12/13 0837 01/16/13  NA 138 136  --   --  136 134* 138  K 4.6 3.6  --   --  3.6 3.6 4.1  CL 102 98  --   --  101 98  --   CO2 27 22  --   --  25 27  --   GLUCOSE 106* 145*  --   --  154* 114*  --   BUN 27* 17  --   --  16 20 27*  CREATININE 0.8 0.52  --   --  0.53 0.54 0.9  CALCIUM 9.5 8.8  --   --  8.2* 8.3*  --   MG  --   --  1.8  --   --   --   --   TSH 1.16  --   --  1.034  --   --   --    Liver Function Tests:  Recent Labs  01/10/13 1212  AST 57*  ALT 29  ALKPHOS 107  BILITOT 1.5*  PROT 6.9  ALBUMIN 3.4*   No results found for this basename: LIPASE, AMYLASE,  in the last 8760 hours No results found for this basename: AMMONIA,  in the last 8760 hours CBC:  Recent Labs  02/29/12 1552 01/10/13 1212 01/11/13 0640  WBC 4.7 6.2 7.8  NEUTROABS 2.7 4.8 6.3  HGB 14.0 14.5 13.8  HCT 41.3 43.2 39.1  MCV 94.1 91.9 89.5  PLT 180.0 144* 138*   Lipid Panel: No results found for this basename: CHOL, HDL, LDLCALC, TRIG, CHOLHDL, LDLDIRECT,  in the last 8760 hours Anemia Panel: No results found for this basename: FOLATE, IRON, VITAMINB12,  in the last 8760 hours  Past Procedures:   01/11/13 CXR  IMPRESSION:   Worsening multilobar airspace opacities which could represent  pneumonia, although other alveolar filling processes such as  hemorrhage, asymmetric edema, or aspiration could have a similar  appearance. Radiographic resolution of pneumonia may take 4-6  weeks.    Assessment/Plan HTN (hypertension) Blood pressure has been controlled on Norvasc 2.5mg , Lotensin 20mg , Lasix 20mg   Edema Trace in BLE--has prn Lasix 20mg  in addition to daily  20mg  for weight gain 2-3Ibs/24hrs or 3-5Ibs/week.   Atrial fibrillation Rate controlled and takes Coumadin for PE/DVT prophylaxis.   CHF (congestive heart failure) Compensated clinically.   Hypothyroidism Takes Levothyroxine . TSH  normal.   Streptococcal pneumonia Last dose of Levaquin 01/18/13  MVP (mitral valve prolapse) Associated with severe mitral regurgitation, f/u Dr. Particia Nearing with [facility]     Family/ Staff Communication: observe s/s of PNA  Goals of Care: [place]  Labs/tests ordered: PT/INR 01/18/13--Coumadin on hold for supra therapeutic INR 4.14 01/16/13.

## 2013-01-16 NOTE — Clinical Social Work Placement (Addendum)
    Clinical Social Work Department CLINICAL SOCIAL WORK PLACEMENT NOTE 01/16/2013  Patient:  Rose Weber, Rose Weber  Account Number:  [id number] Admit date:  01/10/2013  Clinical Social Worker:  Lupita Leash Montanna Mcbain, LCSWA  Date/time:  01/14/2013 01:30 PM  Clinical Social Work is seeking post-discharge placement for this patient at the following level of care:   SKILLED NURSING   (*CSW will update this form in Epic as items are completed)     Patient/family provided with [facility] Department of Clinical Social Work's list of facilities offering this level of care within the geographic area requested by the patient (or if unable, by the patient's family).    Patient/family informed of their freedom to choose among providers that offer the needed level of care, that participate in Medicare, Medicaid or managed care program needed by the patient, have an available bed and are willing to accept the patient.    Patient/family informed of [facility]' ownership interest in [facility], as well as of the fact that they are under no obligation to receive care at this facility.  PASARR submitted to EDS on 01/15/2013 PASARR number received from EDS on 01/15/2013  FL2 transmitted to all facilities in geographic area requested by pt/family on   FL2 transmitted to all facilities within larger geographic area on   Patient informed that his/her managed care company has contracts with or will negotiate with  certain facilities, including the following:   Patient's referall information was sent to [facility] and [facility].  She did not want to consider any other option.     Patient/family informed of bed offers received:  01/15/2013 Patient chooses bed at [facility] Physician recommends and patient chooses bed at    Patient to be transferred to [facility] [place] on  01/15/2013 Patient to be transferred to facility by [facility]  (with friend)  The following physician  request were entered in Epic:   Additional Comments: 01/15/13.  Per Adella Nissen at [facility]- she anticipates a rehab bed to open up tomorrow. Patient is very pleased with arrangement.  Notified nursing of d/c. No further CSW needs identified. CSW signing off.  Lorri Frederick. Sande Pickert, LCSWA  [phone]

## 2013-01-16 NOTE — Assessment & Plan Note (Signed)
Associated with severe mitral regurgitation, f/u Dr. Gordan with [facility]    

## 2013-01-16 NOTE — Assessment & Plan Note (Signed)
Blood pressure has been controlled on Norvasc 2.5mg , Lotensin 20mg , Lasix 20mg 

## 2013-01-17 LAB — CULTURE, BLOOD (ROUTINE X 2): Culture: NO GROWTH

## 2013-01-20 ENCOUNTER — Non-Acute Institutional Stay (SKILLED_NURSING_FACILITY): Payer: Medicare Other | Admitting: Nurse Practitioner

## 2013-01-20 DIAGNOSIS — R5381 Other malaise: Secondary | ICD-10-CM

## 2013-01-20 DIAGNOSIS — E039 Hypothyroidism, unspecified: Secondary | ICD-10-CM

## 2013-01-20 DIAGNOSIS — I10 Essential (primary) hypertension: Secondary | ICD-10-CM

## 2013-01-20 DIAGNOSIS — I4891 Unspecified atrial fibrillation: Secondary | ICD-10-CM

## 2013-01-20 DIAGNOSIS — R5383 Other fatigue: Secondary | ICD-10-CM

## 2013-01-20 DIAGNOSIS — Z7901 Long term (current) use of anticoagulants: Secondary | ICD-10-CM

## 2013-01-20 DIAGNOSIS — I341 Nonrheumatic mitral (valve) prolapse: Secondary | ICD-10-CM

## 2013-01-20 DIAGNOSIS — R609 Edema, unspecified: Secondary | ICD-10-CM

## 2013-01-20 DIAGNOSIS — I059 Rheumatic mitral valve disease, unspecified: Secondary | ICD-10-CM

## 2013-01-20 DIAGNOSIS — J154 Pneumonia due to other streptococci: Secondary | ICD-10-CM

## 2013-01-20 NOTE — Assessment & Plan Note (Signed)
Takes Levothyroxine . TSH normal.

## 2013-01-20 NOTE — Assessment & Plan Note (Signed)
Last dose of Levaquin 01/18/13, respiratory symptoms resolved, but her generalized weakness from her acute illness persists--will continue rehab to restore her strength and ADL function to IL level.

## 2013-01-20 NOTE — Assessment & Plan Note (Signed)
Advanced age and acute PNA all contributed to her generalized weakness--expected to improve.

## 2013-01-20 NOTE — Progress Notes (Signed)
Patient ID: Rose Weber, female   DOB: May 19, 1913, 79 y.o.   MRN: [id number]  Chief Complaint:  Chief Complaint  Patient presents with  . Medical Managment of Chronic Issues    pedal edema.      HPI:   Problem List Items Addressed This Visit   Edema - Primary (Chronic)     Worse than last seen, but the patient stated this not new, no weight gain so far, continue daily weight, f/u Cardiology next week--has prn Lasix 20mg  in addition to daily 20mg  for weight gain 2-3Ibs/24hrs or 3-5Ibs/week.       HTN (hypertension)     Blood pressure has been controlled on Norvasc 2.5mg , Lotensin 20mg , Lasix 20mg       MVP (mitral valve prolapse)     Associated with severe mitral regurgitation, f/u Dr. Particia Nearing with [facility]       Chronic anticoagulation     Currently Coumadin on hold due to supra-therapeutic INR--PT/INR in am pending.     Atrial fibrillation     Rate controlled and takes Coumadin for PE/DVT prophylaxis.       Hypothyroidism     Takes Levothyroxine . TSH normal.       Fatigue     Advanced age and acute PNA all contributed to her generalized weakness--expected to improve.    Streptococcal pneumonia     Last dose of Levaquin 01/18/13, respiratory symptoms resolved, but her generalized weakness from her acute illness persists--will continue rehab to restore her strength and ADL function to IL level.          Review of Systems:  Review of Systems  Constitutional: Negative for fever, chills, weight loss, malaise/fatigue and diaphoresis.  HENT: Positive for hearing loss. Negative for congestion, sore throat, neck pain and ear discharge.   Eyes: Positive for blurred vision. Negative for double vision, photophobia, pain, discharge and redness.       Legally blind, had corneal transplants 4x the right and 2x the left.   Respiratory: Positive for cough (hacking). Negative for hemoptysis, sputum production, shortness of breath and wheezing.   Cardiovascular: Positive  for leg swelling (chronic pedal edema. ). Negative for chest pain, palpitations, orthopnea, claudication and PND.  Gastrointestinal: Negative for heartburn, nausea, vomiting, abdominal pain, diarrhea, constipation, blood in stool and melena.  Genitourinary: Negative for dysuria, urgency, frequency, hematuria and flank pain.  Musculoskeletal: Negative for myalgias, back pain, joint pain and falls.  Skin: Negative for itching and rash.  Neurological: Negative for dizziness, tingling, tremors, sensory change, speech change, focal weakness, seizures, loss of consciousness, weakness and headaches.  Endo/Heme/Allergies: Negative for environmental allergies and polydipsia. Bruises/bleeds easily.  Psychiatric/Behavioral: Negative for depression, hallucinations and memory loss. The patient is not nervous/anxious and does not have insomnia.      Medications: Patient's Medications  New Prescriptions   No medications on file  Previous Medications   AMLODIPINE (NORVASC) 2.5 MG TABLET    Take 1 tablet (2.5 mg total) by mouth daily.   ARTIFICIAL TEAR OINTMENT (REFRESH LACRI-LUBE) OINT    Apply 1 application to eye at bedtime.   BENAZEPRIL (LOTENSIN) 40 MG TABLET    Take 0.5 tablets (20 mg total) by mouth 2 (two) times daily.   BISMUTH SUBSALICYLATE (PEPTO BISMOL) 262 MG/15ML SUSPENSION    Take 15 mLs by mouth as needed for indigestion.    CALCIUM CARBONATE (OS-CAL) 600 MG TABS    Take 600 mg by mouth daily.   CARBOXYMETHYLCELLULOSE (REFRESH PLUS) 0.5 % SOLN  Place 1 drop into both eyes as needed (dry eyes).   CHOLECALCIFEROL (VITAMIN D) 2000 UNIT TABLET    Take 2,000 Units by mouth daily.     CYCLOSPORINE (RESTASIS) 0.05 % OPHTHALMIC EMULSION    Apply 1 drop to eye 3 (three) times daily.   ESTROGENS, CONJUGATED, (PREMARIN) 0.3 MG TABLET    Take 0.3 mg by mouth See admin instructions. Take daily for 21 days then do not take for 7 days.   FUROSEMIDE (LASIX) 20 MG TABLET    Take 20 mg by mouth See admin  instructions. 20mg  daily and may take additional dose for edema if needed   LEVOTHYROXINE (SYNTHROID, LEVOTHROID) 25 MCG TABLET    Take 25 mcg by mouth daily.     POTASSIUM CHLORIDE SA (K-DUR,KLOR-CON) 20 MEQ TABLET    Take 20 mEq by mouth daily.   PREDNISOLONE ACETATE (PRED FORTE) 1 % OPHTHALMIC SUSPENSION    Apply 1 drop to eye See admin instructions. Right eye three times a day; left eye daily   TIMOLOL (TIMOPTIC) 0.5 % OPHTHALMIC SOLUTION    Place 1 drop into both eyes 2 (two) times daily.    WARFARIN (COUMADIN) 2.5 MG TABLET    Take 1 tablet (2.5 mg total) by mouth daily. Hold dose 5/28. Adjust to keep INR between 2.0 and 3.0.  Modified Medications   No medications on file  Discontinued Medications   No medications on file     Physical Exam: Physical Exam  Constitutional: She is oriented to person, place, and time. She appears well-developed and well-nourished. No distress.  HENT:  Head: Normocephalic and atraumatic.  Right Ear: External ear normal.  Left Ear: External ear normal.  Nose: Nose normal.  Mouth/Throat: Oropharynx is clear and moist. No oropharyngeal exudate.  Eyes: Conjunctivae and EOM are normal. Pupils are equal, round, and reactive to light. Right eye exhibits no discharge. Left eye exhibits no discharge. No scleral icterus.  Corneal transplants.   Neck: Normal range of motion. Neck supple. No JVD present. No thyromegaly (goitor ) present.  Cardiovascular: Normal rate.  An irregular rhythm present.  Murmur heard.  Systolic murmur is present with a grade of 4/6  2+ pedal edema  Pulmonary/Chest: Effort normal. No stridor. No respiratory distress. She has no wheezes. She has rales in the right lower field and the left lower field.  Abdominal: Soft. Bowel sounds are normal. She exhibits no distension. There is no tenderness. There is no rebound.  Musculoskeletal: Normal range of motion. She exhibits no edema and no tenderness.  Lymphadenopathy:    She has no cervical  adenopathy.  Neurological: She is alert and oriented to person, place, and time. She has normal reflexes. She displays normal reflexes. No cranial nerve deficit. She exhibits normal muscle tone. Coordination normal.  Skin: Skin is warm and dry. No rash noted. She is not diaphoretic. No erythema.  Psychiatric: She has a normal mood and affect. Her behavior is normal. Judgment and thought content normal.     Filed Vitals:   01/20/13 1543  BP: 120/60  Pulse: 70  Temp: 97.5 F (36.4 C)  TempSrc: Tympanic  Resp: 18      Labs reviewed: Basic Metabolic Panel:  Recent Labs  47/82/95 1552 01/10/13 1212 01/10/13 1213 01/10/13 1557 01/11/13 0640 01/12/13 0837 01/16/13  NA 138 136  --   --  136 134* 138  K 4.6 3.6  --   --  3.6 3.6 4.1  CL 102 98  --   --  101 98  --   CO2 27 22  --   --  25 27  --   GLUCOSE 106* 145*  --   --  154* 114*  --   BUN 27* 17  --   --  16 20 27*  CREATININE 0.8 0.52  --   --  0.53 0.54 0.9  CALCIUM 9.5 8.8  --   --  8.2* 8.3*  --   MG  --   --  1.8  --   --   --   --   TSH 1.16  --   --  1.034  --   --   --     Liver Function Tests:  Recent Labs  01/10/13 1212  AST 57*  ALT 29  ALKPHOS 107  BILITOT 1.5*  PROT 6.9  ALBUMIN 3.4*    CBC:  Recent Labs  02/29/12 1552 01/10/13 1212 01/11/13 0640  WBC 4.7 6.2 7.8  NEUTROABS 2.7 4.8 6.3  HGB 14.0 14.5 13.8  HCT 41.3 43.2 39.1  MCV 94.1 91.9 89.5  PLT 180.0 144* 138*    Anemia Panel: No results found for this basename: IRON, FOLATE, VITAMINB12,  in the last 8760 hours  Significant Diagnostic Results:     Assessment/Plan Edema Worse than last seen, but the patient stated this not new, no weight gain so far, continue daily weight, f/u Cardiology next week--has prn Lasix 20mg  in addition to daily 20mg  for weight gain 2-3Ibs/24hrs or 3-5Ibs/week.     HTN (hypertension) Blood pressure has been controlled on Norvasc 2.5mg , Lotensin 20mg , Lasix 20mg     MVP (mitral valve  prolapse) Associated with severe mitral regurgitation, f/u Dr. Particia Nearing with [facility]     Chronic anticoagulation Currently Coumadin on hold due to supra-therapeutic INR--PT/INR in am pending.   Atrial fibrillation Rate controlled and takes Coumadin for PE/DVT prophylaxis.     Hypothyroidism Takes Levothyroxine . TSH normal.     Streptococcal pneumonia Last dose of Levaquin 01/18/13, respiratory symptoms resolved, but her generalized weakness from her acute illness persists--will continue rehab to restore her strength and ADL function to IL level.     Fatigue Advanced age and acute PNA all contributed to her generalized weakness--expected to improve.      Family/ staff Communication: observe weight, edema, respiratory symptoms, and the patient.    Goals of care: [place]   Labs/tests ordered None.

## 2013-01-20 NOTE — Assessment & Plan Note (Signed)
Currently Coumadin on hold due to supra-therapeutic INR--PT/INR in am pending.

## 2013-01-20 NOTE — Assessment & Plan Note (Signed)
Rate controlled and takes Coumadin for PE/DVT prophylaxis.        

## 2013-01-20 NOTE — Assessment & Plan Note (Signed)
Associated with severe mitral regurgitation, f/u Dr. Particia Nearing with [facility]

## 2013-01-20 NOTE — Assessment & Plan Note (Signed)
Blood pressure has been controlled on Norvasc 2.5mg, Lotensin 20mg, Lasix 20mg 

## 2013-01-20 NOTE — Assessment & Plan Note (Signed)
Worse than last seen, but the patient stated this not new, no weight gain so far, continue daily weight, f/u Cardiology next week--has prn Lasix 20mg  in addition to daily 20mg  for weight gain 2-3Ibs/24hrs or 3-5Ibs/week.

## 2013-01-22 ENCOUNTER — Telehealth: Payer: Self-pay | Admitting: *Deleted

## 2013-01-22 NOTE — Telephone Encounter (Signed)
Talked with Graceann Congress at [facility] and she states that Rose Weber INR will be followed and dosed by SNF as long as she is a pt with them and they will let us know when she returns to [facility] assisted living.

## 2013-01-30 ENCOUNTER — Encounter: Payer: Self-pay | Admitting: Cardiology

## 2013-01-30 ENCOUNTER — Ambulatory Visit (INDEPENDENT_AMBULATORY_CARE_PROVIDER_SITE_OTHER): Payer: Managed Care, Other (non HMO) | Admitting: Cardiology

## 2013-01-30 VITALS — BP 110/60 | HR 88 | Ht 61.0 in | Wt 100.2 lb

## 2013-01-30 DIAGNOSIS — I5032 Chronic diastolic (congestive) heart failure: Secondary | ICD-10-CM

## 2013-01-30 DIAGNOSIS — I4891 Unspecified atrial fibrillation: Secondary | ICD-10-CM

## 2013-01-30 DIAGNOSIS — I059 Rheumatic mitral valve disease, unspecified: Secondary | ICD-10-CM

## 2013-01-30 DIAGNOSIS — J154 Pneumonia due to other streptococci: Secondary | ICD-10-CM

## 2013-01-30 DIAGNOSIS — I341 Nonrheumatic mitral (valve) prolapse: Secondary | ICD-10-CM

## 2013-01-30 NOTE — Progress Notes (Signed)
HPI Rose Weber is seen for follow up of Chf, atrial fibrillation, and mitral insufficiency. She has a history of severe mitral valve prolapse. She was recently hospitalized 2 weeks ago with strep pneumonia. Is associated with respiratory distress. She remained relatively stable from a cardiac standpoint. She is still in a skilled nursing facility receiving physical therapy. She reports that her appetite has improved. She has no cough. Her breathing is doing much better. She states she doesn't have as much control over her sodium intake and does have some increased ankle edema.  Allergies  Allergen Reactions  . Alphagan (Brimonidine Tartrate)   . Bystolic (Nebivolol Hcl)   . Diovan (Valsartan)   . Other     PRESERVATIVES  . Penicillins   . Polymyxin B-Trimethoprim   . Sulfa Drugs Cross Reactors   . Tekturna (Aliskiren Fumarate)   . Travatan Z     Current Outpatient Prescriptions on File Prior to Visit  Medication Sig Dispense Refill  . amLODipine (NORVASC) 2.5 MG tablet Take 1 tablet (2.5 mg total) by mouth daily.  60 tablet  6  . Artificial Tear Ointment (REFRESH LACRI-LUBE) OINT Apply 1 application to eye at bedtime.      . benazepril (LOTENSIN) 40 MG tablet Take 0.5 tablets (20 mg total) by mouth 2 (two) times daily.      . calcium carbonate (OS-CAL) 600 MG TABS Take 600 mg by mouth daily.      . carboxymethylcellulose (REFRESH PLUS) 0.5 % SOLN Place 1 drop into both eyes as needed (dry eyes).      . Cholecalciferol (VITAMIN D) 2000 UNIT tablet Take 2,000 Units by mouth daily.        . cycloSPORINE (RESTASIS) 0.05 % ophthalmic emulsion Apply 1 drop to eye 3 (three) times daily.      Marland Kitchen estrogens, conjugated, (PREMARIN) 0.3 MG tablet Take 0.3 mg by mouth See admin instructions. Take daily for 21 days then do not take for 7 days.      . furosemide (LASIX) 20 MG tablet Take 20 mg by mouth See admin instructions. 20mg  daily and may take additional dose for edema if needed      .  levothyroxine (SYNTHROID, LEVOTHROID) 25 MCG tablet Take 25 mcg by mouth daily.        . potassium chloride SA (K-DUR,KLOR-CON) 20 MEQ tablet Take 20 mEq by mouth daily.      . prednisoLONE acetate (PRED FORTE) 1 % ophthalmic suspension Apply 1 drop to eye See admin instructions. Right eye three times a day; left eye daily      . timolol (TIMOPTIC) 0.5 % ophthalmic solution Place 1 drop into both eyes 2 (two) times daily.       Marland Kitchen warfarin (COUMADIN) 2.5 MG tablet Take 1 tablet (2.5 mg total) by mouth daily. Hold dose 5/28. Adjust to keep INR between 2.0 and 3.0.       No current facility-administered medications on file prior to visit.    Past Medical History  Diagnosis Date  . HTN (hypertension)   . MVP (mitral valve prolapse)   . Mitral insufficiency     SEVERE  . Chronic anticoagulation   . Edema   . GERD (gastroesophageal reflux disease)   . Personal history of goiter   . Hypothyroidism   . CHF (congestive heart failure)   . Mitral valve prolapse     With severe mitral insufficiency  . Atrial fibrillation   . Goiter     History of  goiter  . PNA (pneumonia)     strep  5/14    Past Surgical History  Procedure Laterality Date  . Cardioversion  04/14/10  . Appendectomy    . Small intestine surgery      SBO 2000  . Abdominal hysterectomy    . Corneal transplant      MULTIPLE  . Rectal polypectomy      Family History  Problem Relation Age of Onset  . Stroke Sister   . Hip fracture Sister     History   Social History  . Marital Status: Widowed    Spouse Name: N/A    Number of Children: 1  . Years of Education: N/A   Occupational History  . 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 Smoking status: Never Smoker   . Smokeless tobacco: Not on file  . Alcohol Use: No  . Drug Use: No  . Sexually Active: Not on file   Other Topics Concern  . Not on file   Social History Narrative   Lives at [facility].     ROS  As noted in history  of present illness.  All other systems were reviewed and are negative.  PHYSICAL EXAM BP 110/60  Pulse 88  Ht 5\' 1"  (1.549 m)  Wt 100 lb 3.2 oz (45.45 kg)  BMI 18.94 kg/m2 The patient is alert and oriented x 3.    The skin is warm and dry.  Color is normal.  The HEENT exam reveals that the sclera are nonicteric. PERRLA.   The mucous membranes are moist.  The carotids are 2+ without bruits. There is a radiated murmur from the heart.  There is no thyromegaly.  There is no JVD.  The lungs are clear.    The heart exam reveals an irregular rate with a normal S1 and S2.  There  is a loud 4/6 systolic murmur at the apex radiating across the precordium and into the back. The PMI is not displaced.   Abdominal exam reveals good bowel sounds.    There are no masses.  Exam of the legs reveal no clubbing or cyanosis.  There is 1-2+ ankle edema.The distal pulses are intact.  There is mild stasis dermatitis.Cranial nerves II - XII are intact.  Motor and sensory functions are intact.    Laboratory data:  ECG today demonstrates atrial fibrillation with a ventricular response of 92 beats per minute. She has a right superior axis deviation and a nonspecific ST-T wave abnormality.  ASSESSMENT AND PLAN  1. Congestive heart failure secondary to severe mitral insufficiency. She does have some increased ankle edema. We will continue with her current diuretic dosing she is going to try to restrict her sodium intake more.  2. Atrial fibrillation. Rate is well controlled on no rate controlling medication. She is on chronic Coumadin therapy.  3. Mitral valve prolapse with severe mitral insufficiency.  4. Hypothyroidism.   5. Strep pneumonia. Clinically resolved. Anticipate return to independent living soon.

## 2013-01-30 NOTE — Patient Instructions (Signed)
Continue your current therapy   I will see you in 3 months. 

## 2013-02-05 ENCOUNTER — Telehealth: Payer: Self-pay | Admitting: Cardiology

## 2013-02-05 NOTE — Telephone Encounter (Signed)
Returned call to patient she stated she is back in her apartment and she wanted to verify lotensin dosage.Advised lotensin 20 mg twice a day.Advised to call back if needed.

## 2013-02-05 NOTE — Telephone Encounter (Signed)
New Problem  Pt states that [facility] has cut her LOTENSIN dosage in half. She said she is only getting 1/2 a pill once a day.  She wants to know if that is correct.

## 2013-02-06 ENCOUNTER — Ambulatory Visit (INDEPENDENT_AMBULATORY_CARE_PROVIDER_SITE_OTHER): Payer: Medicare Other | Admitting: Cardiology

## 2013-02-06 DIAGNOSIS — I4891 Unspecified atrial fibrillation: Secondary | ICD-10-CM

## 2013-02-06 NOTE — Progress Notes (Signed)
Per [facility] result sheet patients INR on 6/11 was 1.62

## 2013-02-12 ENCOUNTER — Telehealth: Payer: Self-pay | Admitting: Cardiology

## 2013-02-12 NOTE — Telephone Encounter (Signed)
New problem  Pt wants to speak with you but she would not give me any details about what it is about.

## 2013-02-12 NOTE — Telephone Encounter (Signed)
Returned call to patient she stated she was wanting to know if she can take a extra lasix.Patient advised take lasix 20 mg daily and may take a extra 20 mg if needed.

## 2013-02-13 ENCOUNTER — Non-Acute Institutional Stay: Payer: Medicare Other | Admitting: Internal Medicine

## 2013-02-13 VITALS — BP 120/80 | HR 84 | Ht 61.0 in | Wt 105.0 lb

## 2013-02-13 DIAGNOSIS — R609 Edema, unspecified: Secondary | ICD-10-CM

## 2013-02-13 DIAGNOSIS — R7989 Other specified abnormal findings of blood chemistry: Secondary | ICD-10-CM

## 2013-02-13 DIAGNOSIS — I10 Essential (primary) hypertension: Secondary | ICD-10-CM

## 2013-02-13 DIAGNOSIS — E039 Hypothyroidism, unspecified: Secondary | ICD-10-CM

## 2013-02-13 DIAGNOSIS — J154 Pneumonia due to other streptococci: Secondary | ICD-10-CM

## 2013-02-13 DIAGNOSIS — Z8639 Personal history of other endocrine, nutritional and metabolic disease: Secondary | ICD-10-CM

## 2013-02-13 DIAGNOSIS — K219 Gastro-esophageal reflux disease without esophagitis: Secondary | ICD-10-CM

## 2013-02-13 NOTE — Progress Notes (Signed)
Subjective:    Patient ID: Rose Weber, female    DOB: [birth year], 79 y.o.   MRN: [id number]  HPI  Streptococcal pneumonia: Patient was hospitalized 01/10/13 35/27/14 for acute respiratory failure related to streptococcal pneumonia. She was then admitted to a skilled nursing facility. She has been back in her independent apartment for about one week. She feels that she is doing well at this time. She denies residual cough or chest discomfort.  HTN (hypertension): Controlled  Edema: Peripheral edema in the legs and feet now up to about 3+ bilaterally. She has seen Dr. Swaziland, cardiologist. He increased furosemide to 40 mg daily. She has been on this about a week.  Hypothyroidism: Controlled  Other abnormal blood chemistry: Hyperglycemia while ill seems to be doing better.  GERD (gastroesophageal reflux disease): Asymptomatic on current medications. Occasionally uses antacid tablet.  Personal history of goiter: Unchanged  Current Outpatient Prescriptions on File Prior to Visit  Medication Sig Dispense Refill  . amLODipine (NORVASC) 2.5 MG tablet Take 1 tablet (2.5 mg total) by mouth daily.  60 tablet  6  . Artificial Tear Ointment (REFRESH LACRI-LUBE) OINT Apply 1 application to eye at bedtime.      . benazepril (LOTENSIN) 40 MG tablet Take 0.5 tablets (20 mg total) by mouth 2 (two) times daily.      . calcium carbonate (OS-CAL) 600 MG TABS Take 600 mg by mouth daily.      . carboxymethylcellulose (REFRESH PLUS) 0.5 % SOLN Place 1 drop into both eyes as needed (dry eyes).      . Cholecalciferol (VITAMIN D) 2000 UNIT tablet Take 2,000 Units by mouth daily.        . cycloSPORINE (RESTASIS) 0.05 % ophthalmic emulsion Apply 1 drop to eye 3 (three) times daily.      Marland Kitchen estrogens, conjugated, (PREMARIN) 0.3 MG tablet Take 0.3 mg by mouth See admin instructions. Take daily for 21 days then do not take for 7 days.      . furosemide (LASIX) 20 MG tablet Take 20 mg by mouth See admin instructions.  20mg  daily and may take additional dose for edema if needed      . levothyroxine (SYNTHROID, LEVOTHROID) 25 MCG tablet Take 25 mcg by mouth daily.        . potassium chloride SA (K-DUR,KLOR-CON) 20 MEQ tablet Take 20 mEq by mouth daily.      . prednisoLONE acetate (PRED FORTE) 1 % ophthalmic suspension Apply 1 drop to eye See admin instructions. Right eye three times a day; left eye daily      . timolol (TIMOPTIC) 0.5 % ophthalmic solution Place 1 drop into both eyes 2 (two) times daily.       Marland Kitchen warfarin (COUMADIN) 2.5 MG tablet Take 1 tablet (2.5 mg total) by mouth daily. Hold dose 5/28. Adjust to keep INR between 2.0 and 3.0.             Review of Systems DATA OBTAINED: from patient, medical record GENERAL: Feels well   No fevers, fatigue, change in appetite or weight SKIN: No itch, rash or open wounds EYES: No eye pain, dryness or itching  No change in vision EARS: No earache, tinnitus, change in hearing NOSE: No congestion, drainage or bleeding MOUTH/THROAT: No mouth or tooth pain  No sore throat No difficulty chewing or swallowing RESPIRATORY: No cough, wheezing, SOB CARDIAC: No chest pain, palpitations  3+ bipedal edema. CHEST/BREASTS: No discomfort, discharge or lumps in breasts GI: No abdominal pain  No N/V/D or constipation  No heartburn or reflux  GU: No dysuria, frequency or urgency  No change in urine volume or character No nocturia or change in stream   MUSCULOSKELETAL: No joint pain, swelling or stiffness  No back pain  No muscle ache, pain, weakness  Gait is steady  No recent falls.  NEUROLOGIC: No dizziness, fainting, headache, imbalance, numbness  No change in mental status.  PSYCHIATRIC: No feelings of anxiety, depression Sleeps well.  No behavior issue.      Objective:   Physical Exam  GENERAL APPEARANCE: No acute distress, appropriately groomed, normal body habitus. Alert, pleasant, conversant. EYES: Conjunctiva/lids clear. Pupils round, reactive. EOMs intact.   EARS: External exam WNL, canals clear, TM WNL. Hearing grossly normal. NOSE: No deformity or discharge. MOUTH/THROAT: Lips w/o lesions. Oral mucosa, tongue moist, w/o lesion. Oropharynx w/o redness or lesions.  NECK: Supple, full ROM. No thyroid tenderness, small goiter LYMPHATICS: No head, neck or supraclavicular adenopathy RESPIRATORY: Breathing is even, unlabored. Lung sounds are clear and full.  CARDIOVASCULAR: Heart RRR. No murmur or extra heart sounds  ARTERIAL: No carotid, aortic or femoral bruit. Carotid, Femoral, Popliteal, DP,PT pulse 2+.  VENOUS: No varicosities. No venous stasis skin changes  EDEMA: 3+ pedal GASTROINTESTINAL: Abdomen is soft, non-tender, not distended w/ normal bowel sounds. No hepatic or splenic enlargement.  MUSCULOSKELETAL: Moves all extremities with full ROM, strength and tone. Back is without kyphosis, scoliosis or spinal process tenderness. Gait is steady NEUROLOGIC: Oriented to time, place, person. Cranial nerves 2-12 grossly intact, speech clear, no tremor. Patella, brachial DTR 2+. PSYCHIATRIC: Mood and affect appropriate to situation   Anti-coag visit on 02/06/2013  Component Date Value Range Status  . INR 02/06/2013 2.2   Final   [facility] 01[facility]16/2013  Component Date Value Range Status  . Glucose 01/16/2013 82   Final  . BUN 01/16/2013 27* 4 - 21 mg/dL Final  . Creatinine 40/98/1191 0.9  0.5 - 1.1 mg/dL Final  . Potassium 47/82/9562 4.1  3.4 - 5.3 mmol/L Final  . Sodium 01/16/2013 138  137 - 147 mmol/L Final  Admission on 01/10/2013, Discharged on 01/15/2013  Component Date Value Range Status  . WBC 01/10/2013 6.2  4.0 - 10.5 K/uL Final  . RBC 01/10/2013 4.70  3.87 - 5.11 MIL/uL Final  . Hemoglobin 01/10/2013 14.5  12.0 - 15.0 g/dL Final  . HCT 13/03/6577 43.2  36.0 - 46.0 % Final  . MCV 01/10/2013 91.9  78.0 - 100.0 fL Final  . MCH 01/10/2013 30.9  26.0 - 34.0 pg Final  . MCHC 01/10/2013 33.6  30.0 - 36.0 g/dL Final  .  RDW 46/96/2952 15.0  11.5 - 15.5 % Final  . Platelets 01/10/2013 144* 150 - 400 K/uL Final  . Neutrophils Relative % 01/10/2013 77  43 - 77 % Final  . Lymphocytes Relative 01/10/2013 12  12 - 46 % Final  . Monocytes Relative 01/10/2013 10  3 - 12 % Final  . Eosinophils Relative 01/10/2013 0  0 - 5 % Final  . Basophils Relative 01/10/2013 1  0 - 1 % Final  . Neutro Abs 01/10/2013 4.8  1.7 - 7.7 K/uL Final  . Lymphs Abs 01/10/2013 0.7  0.7 - 4.0 K/uL Final  . Monocytes Absolute 01/10/2013 0.6  0.1 - 1.0 K/uL Final  . Eosinophils Absolute 01/10/2013 0.0  0.0 - 0.7 K/uL Final  . Basophils Absolute 01/10/2013 0.1  0.0 - 0.1 K/uL Final  .  Sodium 01/10/2013 136  135 - 145 mEq/L Final  . Potassium 01/10/2013 3.6  3.5 - 5.1 mEq/L Final  . Chloride 01/10/2013 98  96 - 112 mEq/L Final  . CO2 01/10/2013 22  19 - 32 mEq/L Final  . Glucose, Bld 01/10/2013 145* 70 - 99 mg/dL Final  . BUN 03/47/4259 17  6 - 23 mg/dL Final  . Creatinine, Ser 01/10/2013 0.52  0.50 - 1.10 mg/dL Final  . Calcium 56/38/7564 8.8  8.4 - 10.5 mg/dL Final  . Total Protein 01/10/2013 6.9  6.0 - 8.3 g/dL Final  . Albumin 33/29/5188 3.4* 3.5 - 5.2 g/dL Final  . AST 41/66/0630 57* 0 - 37 U/L Final  . ALT 01/10/2013 29  0 - 35 U/L Final  . Alkaline Phosphatase 01/10/2013 107  39 - 117 U/L Final  . Total Bilirubin 01/10/2013 1.5* 0.3 - 1.2 mg/dL Final  . GFR calc non Af Amer 01/10/2013 77* >90 mL/min Final  . GFR calc Af Amer 01/10/2013 89* >90 mL/min Final   Comment:                                 The eGFR has been calculated                          using the CKD EPI equation.                          This calculation has not been                          validated in all clinical                          situations.                          eGFR's persistently                          <90 mL/min signify                          possible Chronic Kidney Disease.  . Pro B Natriuretic peptide (BNP) 01/10/2013 7369.0* 0 - 450  pg/mL Final  . Color, Urine 01/10/2013 YELLOW  YELLOW Final  . APPearance 01/10/2013 CLEAR  CLEAR Final  . Specific Gravity, Urine 01/10/2013 1.011  1.005 - 1.030 Final  . pH 01/10/2013 5.5  5.0 - 8.0 Final  . Glucose, UA 01/10/2013 NEGATIVE  NEGATIVE mg/dL Final  . Hgb urine dipstick 01/10/2013 NEGATIVE  NEGATIVE Final  . Bilirubin Urine 01/10/2013 NEGATIVE  NEGATIVE Final  . Ketones, ur 01/10/2013 15* NEGATIVE mg/dL Final  . Protein, ur 16/08/930 NEGATIVE  NEGATIVE mg/dL Final  . Urobilinogen, UA 01/10/2013 0.2  0.0 - 1.0 mg/dL Final  . Nitrite 35/57/3220 NEGATIVE  NEGATIVE Final  . Leukocytes, UA 01/10/2013 NEGATIVE  NEGATIVE Final   MICROSCOPIC NOT DONE ON URINES WITH NEGATIVE PROTEIN, BLOOD, LEUKOCYTES, NITRITE, OR GLUCOSE <1000 mg/dL.  Marland Kitchen Specimen Description 01/10/2013 BLOOD RIGHT ARM   Final  . Special Requests 01/10/2013 BOTTLES DRAWN AEROBIC AND ANAEROBIC 10CC   Final  . Culture  Setup Time 01/10/2013 01/11/2013 00:05   Final  .  Culture 01/10/2013 NO GROWTH 5 DAYS   Final  . Report Status 01/10/2013 01/17/2013 FINAL   Final  . Specimen Description 01/10/2013 BLOOD LEFT ARM   Final  . Special Requests 01/10/2013 BOTTLES DRAWN AEROBIC AND ANAEROBIC R 7CC B 10CC   Final  . Culture  Setup Time 01/10/2013 01/11/2013 00:05   Final  . Culture 01/10/2013 NO GROWTH 5 DAYS   Final  . Report Status 01/10/2013 01/17/2013 FINAL   Final  . Specimen Description 01/10/2013 URINE, CATHETERIZED   Final  . Special Requests 01/10/2013 FOLEY   Final  . Legionella Antigen, Urine 01/10/2013 Negative for Legionella pneumophilia serogroup 1   Final  . Report Status 01/10/2013 01/11/2013 FINAL   Final  . Strep Pneumo Urinary Antigen 01/10/2013 POSITIVE* NEGATIVE Final  . Prothrombin Time 01/10/2013 23.2* 11.6 - 15.2 seconds Final  . INR 01/10/2013 2.16* 0.00 - 1.49 Final  . TSH 01/10/2013 1.034  0.350 - 4.500 uIU/mL Final  . HIV 01/10/2013 NON REACTIVE  NON REACTIVE Final  . Magnesium 01/10/2013 1.8   1.5 - 2.5 mg/dL Final  . Sodium 40/98/1191 136  135 - 145 mEq/L Final  . Potassium 01/11/2013 3.6  3.5 - 5.1 mEq/L Final  . Chloride 01/11/2013 101  96 - 112 mEq/L Final  . CO2 01/11/2013 25  19 - 32 mEq/L Final  . Glucose, Bld 01/11/2013 154* 70 - 99 mg/dL Final  . BUN 47/82/9562 16  6 - 23 mg/dL Final  . Creatinine, Ser 01/11/2013 0.53  0.50 - 1.10 mg/dL Final  . Calcium 13/03/6577 8.2* 8.4 - 10.5 mg/dL Final  . GFR calc non Af Amer 01/11/2013 76* >90 mL/min Final  . GFR calc Af Amer 01/11/2013 89* >90 mL/min Final   Comment:                                 The eGFR has been calculated                          using the CKD EPI equation.                          This calculation has not been                          validated in all clinical                          situations.                          eGFR's persistently                          <90 mL/min signify                          possible Chronic Kidney Disease.  . WBC 01/11/2013 7.8  4.0 - 10.5 K/uL Final  . RBC 01/11/2013 4.37  3.87 - 5.11 MIL/uL Final  . Hemoglobin 01/11/2013 13.8  12.0 - 15.0 g/dL Final  . HCT 46/96/2952 39.1  36.0 - 46.0 % Final  . MCV 01/11/2013 89.5  78.0 - 100.0 fL Final  . MCH 01/11/2013 31.6  26.0 -  34.0 pg Final  . MCHC 01/11/2013 35.3  30.0 - 36.0 g/dL Final  . RDW 62/13/0865 14.6  11.5 - 15.5 % Final  . Platelets 01/11/2013 138* 150 - 400 K/uL Final  . Neutrophils Relative % 01/11/2013 81* 43 - 77 % Final  . Lymphocytes Relative 01/11/2013 12  12 - 46 % Final  . Monocytes Relative 01/11/2013 6  3 - 12 % Final  . Eosinophils Relative 01/11/2013 0  0 - 5 % Final  . Basophils Relative 01/11/2013 1  0 - 1 % Final  . Neutro Abs 01/11/2013 6.3  1.7 - 7.7 K/uL Final  . Lymphs Abs 01/11/2013 0.9  0.7 - 4.0 K/uL Final  . Monocytes Absolute 01/11/2013 0.5  0.1 - 1.0 K/uL Final  . Eosinophils Absolute 01/11/2013 0.0  0.0 - 0.7 K/uL Final  . Basophils Absolute 01/11/2013 0.1  0.0 - 0.1 K/uL Final   . WBC Morphology 01/11/2013 ATYPICAL LYMPHOCYTES   Final  . MRSA by PCR 01/10/2013 NEGATIVE  NEGATIVE Final   Comment:                                 The GeneXpert MRSA Assay (FDA                          approved for NASAL specimens                          only), is one component of a                          comprehensive MRSA colonization                          surveillance program. It is not                          intended to diagnose MRSA                          infection nor to guide or                          monitor treatment for                          MRSA infections.  . Prothrombin Time 01/11/2013 28.4* 11.6 - 15.2 seconds Final  . INR 01/11/2013 2.84* 0.00 - 1.49 Final  . Prothrombin Time 01/12/2013 26.3* 11.6 - 15.2 seconds Final  . INR 01/12/2013 2.56* 0.00 - 1.49 Final  . Sodium 01/12/2013 134* 135 - 145 mEq/L Final  . Potassium 01/12/2013 3.6  3.5 - 5.1 mEq/L Final  . Chloride 01/12/2013 98  96 - 112 mEq/L Final  . CO2 01/12/2013 27  19 - 32 mEq/L Final  . Glucose, Bld 01/12/2013 114* 70 - 99 mg/dL Final  . BUN 78/46/9629 20  6 - 23 mg/dL Final  . Creatinine, Ser 01/12/2013 0.54  0.50 - 1.10 mg/dL Final  . Calcium 52/84/1324 8.3* 8.4 - 10.5 mg/dL Final  . GFR calc non Af Amer 01/12/2013 76* >90 mL/min Final  . GFR calc Af Amer 01/12/2013 88* >90 mL/min Final  Comment:                                 The eGFR has been calculated                          using the CKD EPI equation.                          This calculation has not been                          validated in all clinical                          situations.                          eGFR's persistently                          <90 mL/min signify                          possible Chronic Kidney Disease.  Marland Kitchen Prothrombin Time 01/13/2013 20.7* 11.6 - 15.2 seconds Final  . INR 01/13/2013 1.85* 0.00 - 1.49 Final  . Prothrombin Time 01/14/2013 25.5* 11.6 - 15.2 seconds Final  . INR 01/14/2013  2.46* 0.00 - 1.49 Final  . Prothrombin Time 01/15/2013 33.5* 11.6 - 15.2 seconds Final  . INR 01/15/2013 3.55* 0.00 - 1.49 Final        Assessment & Plan:  Streptococcal pneumonia: Resolved  HTN (hypertension): Controlled  Edema: On higher dose furosemide per directions of Dr. [place]. She also is using amlodipine which may aggravate her edema. I did not change this medication today.  Hypothyroidism: Controlled  Other abnormal blood chemistry: Hyperglycemia under control. Glucose 82 mg percent on 01/16/2013.  GERD (gastroesophageal reflux disease): Asymptomatic.  Personal history of goiter: Unchanged.

## 2013-02-15 ENCOUNTER — Encounter: Payer: Self-pay | Admitting: Internal Medicine

## 2013-02-15 DIAGNOSIS — R739 Hyperglycemia, unspecified: Secondary | ICD-10-CM | POA: Insufficient documentation

## 2013-02-15 NOTE — Patient Instructions (Addendum)
Continue current medications. 

## 2013-02-18 ENCOUNTER — Other Ambulatory Visit: Payer: Self-pay | Admitting: Cardiology

## 2013-02-18 ENCOUNTER — Encounter: Payer: Self-pay | Admitting: *Deleted

## 2013-02-18 NOTE — Telephone Encounter (Signed)
potassium chloride SA (K-DUR,KLOR-CON) 20 MEQ tablet  Take 20 mEq by mouth daily.   Patient Instructions  Continue your current therapy I will see you in 3 months. Peter M Swaziland, MD at 01/30/2013  8:31 PM

## 2013-02-25 ENCOUNTER — Ambulatory Visit (INDEPENDENT_AMBULATORY_CARE_PROVIDER_SITE_OTHER): Payer: Medicare Other | Admitting: Cardiology

## 2013-02-25 DIAGNOSIS — I4891 Unspecified atrial fibrillation: Secondary | ICD-10-CM

## 2013-02-25 LAB — POCT INR: INR: 3.4

## 2013-03-11 ENCOUNTER — Ambulatory Visit (INDEPENDENT_AMBULATORY_CARE_PROVIDER_SITE_OTHER): Payer: Medicare Other | Admitting: Pharmacist

## 2013-03-11 DIAGNOSIS — I4891 Unspecified atrial fibrillation: Secondary | ICD-10-CM

## 2013-03-11 LAB — POCT INR: INR: 2.8

## 2013-03-12 ENCOUNTER — Other Ambulatory Visit: Payer: Self-pay | Admitting: Cardiology

## 2013-03-17 ENCOUNTER — Telehealth: Payer: Self-pay | Admitting: Cardiology

## 2013-03-17 NOTE — Telephone Encounter (Signed)
Returned call to patient she stated she has been taking lasix 20 mg daily and 20 mg extra if needed.She stated she thinks she might be allergic to lasix that she held lasix today and her leg edema seems better.Spoke to Dr.Jordan he advised she needs to take lasix 20 mg daily and may take a extra 20 mg if needed.Advised to keep appointment with Dr.Jordan 03/21/13.

## 2013-03-17 NOTE — Telephone Encounter (Signed)
New prob  Pt states that she has a lot of swelling in her legs and she is taking Lasix but it does not seem to be helping.

## 2013-03-20 ENCOUNTER — Encounter: Payer: Self-pay | Admitting: Cardiology

## 2013-03-20 ENCOUNTER — Ambulatory Visit (INDEPENDENT_AMBULATORY_CARE_PROVIDER_SITE_OTHER): Payer: Medicare Other | Admitting: *Deleted

## 2013-03-20 ENCOUNTER — Ambulatory Visit: Payer: Medicare Other | Admitting: Cardiology

## 2013-03-20 ENCOUNTER — Ambulatory Visit (INDEPENDENT_AMBULATORY_CARE_PROVIDER_SITE_OTHER): Payer: Medicare Other | Admitting: Cardiology

## 2013-03-20 VITALS — BP 122/66 | HR 71 | Ht 61.0 in | Wt 112.6 lb

## 2013-03-20 DIAGNOSIS — I4891 Unspecified atrial fibrillation: Secondary | ICD-10-CM

## 2013-03-20 DIAGNOSIS — E039 Hypothyroidism, unspecified: Secondary | ICD-10-CM

## 2013-03-20 DIAGNOSIS — I509 Heart failure, unspecified: Secondary | ICD-10-CM

## 2013-03-20 DIAGNOSIS — I10 Essential (primary) hypertension: Secondary | ICD-10-CM

## 2013-03-20 DIAGNOSIS — I5033 Acute on chronic diastolic (congestive) heart failure: Secondary | ICD-10-CM

## 2013-03-20 DIAGNOSIS — I341 Nonrheumatic mitral (valve) prolapse: Secondary | ICD-10-CM

## 2013-03-20 DIAGNOSIS — I059 Rheumatic mitral valve disease, unspecified: Secondary | ICD-10-CM

## 2013-03-20 DIAGNOSIS — R609 Edema, unspecified: Secondary | ICD-10-CM

## 2013-03-20 LAB — BASIC METABOLIC PANEL
CO2: 30 mEq/L (ref 19–32)
Calcium: 8.9 mg/dL (ref 8.4–10.5)
Chloride: 105 mEq/L (ref 96–112)
Creatinine, Ser: 0.6 mg/dL (ref 0.4–1.2)
Glucose, Bld: 96 mg/dL (ref 70–99)

## 2013-03-20 NOTE — Progress Notes (Signed)
HPI Rose Weber is seen for follow up of Chf, atrial fibrillation, and mitral insufficiency. She has a history of severe mitral valve prolapse. She was recently hospitalized in May with strep pneumonia associated with respiratory distress. She then spent 2 weeks in a nursing facility. When seen in June she did have some increased edema which he attributed to her increased sodium intake while in the nursing facility. She has been able to monitor her sodium intake much better now. Unfortunately her edema has progressed. Her legs have much more swelling and she feels tight in her abdomen.  She increased her Lasix to 40 mg per day but still has noticed no improvement despite good response to the diuretic.  Allergies  Allergen Reactions  . Alphagan (Brimonidine Tartrate)   . Avelox (Moxifloxacin Hcl In Nacl)   . Benadryl (Diphenhydramine)   . Bystolic (Nebivolol Hcl)   . Diltiazem   . Diovan (Valsartan)   . Erythromycin   . Other     PRESERVATIVES  . Penicillins   . Polymyxin B-Trimethoprim   . Restasis (Cyclosporine)   . Sulfa Drugs Cross Reactors   . Tekturna (Aliskiren Fumarate)   . Travatan Z   . Xalatan (Latanoprost)     Current Outpatient Prescriptions on File Prior to Visit  Medication Sig Dispense Refill  . amLODipine (NORVASC) 2.5 MG tablet Take 1 tablet (2.5 mg total) by mouth daily.  60 tablet  6  . Artificial Tear Ointment (REFRESH LACRI-LUBE) OINT Apply 1 application to eye at bedtime. Apply in both eyes at bedtime for dry eyes.      . benazepril (LOTENSIN) 40 MG tablet Take 0.5 tablets (20 mg total) by mouth 2 (two) times daily.      . calcium carbonate (OS-CAL) 600 MG TABS Take 600 mg by mouth daily. Take 1 tablet daily.      . carboxymethylcellulose (REFRESH PLUS) 0.5 % SOLN Place 1 drop into both eyes as needed (dry eyes). Instill 5-6 drops a day in both eyes for dry eyes.      . Cholecalciferol (VITAMIN D) 2000 UNIT tablet Take 400 Units by mouth daily. Take 2 tablets daily  for vitamin D supplement.      . cycloSPORINE (RESTASIS) 0.05 % ophthalmic emulsion Apply 1 drop to eye 3 (three) times daily.      Marland Kitchen estrogens, conjugated, (PREMARIN) 0.3 MG tablet Take 0.3 mg by mouth See admin instructions. Take daily for 21 days then do not take for 7 days.      Marland Kitchen LASIX 20 MG tablet TAKE 1 TABLET BY MOUTH AS DIRECTED.  30 tablet  3  . levothyroxine (SYNTHROID, LEVOTHROID) 25 MCG tablet Take 25 mcg by mouth daily. Take 1 tablet daily for thyroid.      . potassium chloride SA (K-DUR,KLOR-CON) 20 MEQ tablet TAKE 1 TABLET DAILY.  30 tablet  6  . prednisoLONE acetate (PRED FORTE) 1 % ophthalmic suspension Place 1 drop into both eyes daily. Right eye three times a day; left eye daily      . timolol (TIMOPTIC) 0.5 % ophthalmic solution Place 1 drop into both eyes at bedtime. Instill 1 drop in each eye nightly.      . vitamin C (ASCORBIC ACID) 500 MG tablet Take 500 mg by mouth daily. Take 1 tablet daily for vitamin Csupplement      . vitamin E 400 UNIT capsule Take 400 Units by mouth daily. Take 1 tablet daily for vitamin E supplement.      Marland Kitchen  warfarin (COUMADIN) 2.5 MG tablet Take 1 tablet (2.5 mg total) by mouth daily. Hold dose 5/28. Adjust to keep INR between 2.0 and 3.0.       No current facility-administered medications on file prior to visit.    Past Medical History  Diagnosis Date  . HTN (hypertension)   . MVP (mitral valve prolapse)   . Mitral insufficiency     SEVERE  . Chronic anticoagulation   . Edema   . GERD (gastroesophageal reflux disease)   . Personal history of goiter   . Hypothyroidism   . CHF (congestive heart failure)   . Mitral valve prolapse     With severe mitral insufficiency  . Atrial fibrillation   . Goiter     History of goiter  . PNA (pneumonia)     strep  5/14  . Other malaise and fatigue 01/25/2012  . Visual disturbances 10/12/2010  . Benign neoplasm of colon 04/09/2003  . Cardiac dysrhythmia, unspecified 09/26/2000  . Other corneal  degenerations 02/19/1986    Fuchs corneal dystrophy    Past Surgical History  Procedure Laterality Date  . Cardioversion  04/14/10  . Appendectomy    . Small intestine surgery      SBO 2000  . Abdominal hysterectomy    . Corneal transplant      MULTIPLE  . Rectal polypectomy      Family History  Problem Relation Age of Onset  . Hip fracture Sister   . Stroke Father   . Alzheimer's disease Brother   . Stroke Sister     History   Social History  . Marital Status: Widowed    Spouse Name: N/A    Number of Children: 1  . Years of Education: N/A   Occupational History  . 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 Smoking status: Never Smoker   . Smokeless tobacco: Never Used  . Alcohol Use: No  . Drug Use: No  . Sexually Active: No   Other Topics Concern  . Not on file   Social History Narrative   Lives at [facility].     ROS  As noted in history of present illness.  All other systems were reviewed and are negative.  PHYSICAL EXAM BP 122/66  Pulse 71  Ht 5\' 1"  (1.549 m)  Wt 112 lb 9.6 oz (51.075 kg)  BMI 21.29 kg/m2  SpO2 94% The patient is alert and oriented x 3.    The skin is warm and dry.  Color is normal.  The HEENT exam reveals that the sclera are nonicteric. PERRLA.   The mucous membranes are moist.  The carotids are 2+ without bruits. There is a radiated murmur from the heart.  There is no thyromegaly.  There is no JVD.  The lungs are clear.    The heart exam reveals an irregular rate with a normal S1 and S2.  There  is a loud 4/6 systolic murmur at the apex radiating across the precordium and into the back. The PMI is not displaced.   Abdominal exam reveals good bowel sounds.    There are no masses.  Exam of the legs reveal no clubbing or cyanosis.  There is 3+  edema to the level of the knee.The distal pulses are intact.  There is mild stasis dermatitis.Cranial nerves II - XII are intact.  Motor and sensory functions are intact.     Laboratory data:  Basic metabolic panel and  TSH are pending today.  ASSESSMENT AND PLAN  1. Congestive heart failure secondary to severe mitral insufficiency. She does have  increased ankle edema. I will increase her Lasix to 40 mg twice a day. We'll increase her potassium to twice daily. Continue sodium restriction. I'll followup again in one week. If no improvement we may consider addition of metolazone and may have to consider stopping the amlodipine.  2. Atrial fibrillation. Rate is well controlled on no rate controlling medication. She is on chronic Coumadin therapy.  3. Mitral valve prolapse with severe mitral insufficiency. She is not a candidate for surgical repair. We have discussed the mitral clip procedure in the past but she is not interested.  4. Hypothyroidism.

## 2013-03-20 NOTE — Patient Instructions (Addendum)
Increase lasix to 40 mg twice a day.  Increase potassium to 20 meq twice a day.  Restrict your salt intake.  Continue your other medication.  We will see you again in one week.

## 2013-03-25 ENCOUNTER — Telehealth: Payer: Self-pay

## 2013-03-25 NOTE — Telephone Encounter (Signed)
Need directions for Lasix clarified and new RX sent in. Pt told pharmacy she takes 4 tablets a day and in the chart it doesn't say that.

## 2013-03-26 ENCOUNTER — Other Ambulatory Visit: Payer: Self-pay

## 2013-03-26 ENCOUNTER — Telehealth: Payer: Self-pay | Admitting: Cardiology

## 2013-03-26 ENCOUNTER — Other Ambulatory Visit: Payer: Self-pay | Admitting: *Deleted

## 2013-03-26 MED ORDER — POTASSIUM CHLORIDE CRYS ER 20 MEQ PO TBCR: EXTENDED_RELEASE_TABLET | ORAL | Status: DC

## 2013-03-26 MED ORDER — FUROSEMIDE 20 MG PO TABS: 40.0000 mg | ORAL_TABLET | Freq: Two times a day (BID) | ORAL | Status: DC

## 2013-03-26 NOTE — Telephone Encounter (Signed)
Error

## 2013-03-26 NOTE — Addendum Note (Signed)
Addended by: Meda Klinefelter D on: 03/26/2013 04:54 PM   Modules accepted: Medications

## 2013-03-26 NOTE — Telephone Encounter (Signed)
[facility] called spoke to pharmacist she wanted clarification on potassium.Potassium increased to 20 meq twice a day and Lasix increased to 40 mg twice a day.

## 2013-03-28 ENCOUNTER — Ambulatory Visit: Payer: Managed Care, Other (non HMO) | Admitting: Cardiology

## 2013-03-28 ENCOUNTER — Ambulatory Visit (INDEPENDENT_AMBULATORY_CARE_PROVIDER_SITE_OTHER): Payer: Medicare Other | Admitting: Cardiology

## 2013-03-28 ENCOUNTER — Encounter: Payer: Self-pay | Admitting: Cardiology

## 2013-03-28 VITALS — BP 128/68 | HR 66 | Ht 61.0 in | Wt 105.1 lb

## 2013-03-28 DIAGNOSIS — I059 Rheumatic mitral valve disease, unspecified: Secondary | ICD-10-CM

## 2013-03-28 DIAGNOSIS — I341 Nonrheumatic mitral (valve) prolapse: Secondary | ICD-10-CM

## 2013-03-28 DIAGNOSIS — R609 Edema, unspecified: Secondary | ICD-10-CM

## 2013-03-28 DIAGNOSIS — I5032 Chronic diastolic (congestive) heart failure: Secondary | ICD-10-CM

## 2013-03-28 DIAGNOSIS — I4891 Unspecified atrial fibrillation: Secondary | ICD-10-CM

## 2013-03-28 LAB — BASIC METABOLIC PANEL
BUN: 27 mg/dL — ABNORMAL HIGH (ref 6–23)
Chloride: 101 mEq/L (ref 96–112)
Creatinine, Ser: 0.7 mg/dL (ref 0.4–1.2)
GFR: 84.82 mL/min (ref >60.00)
Glucose, Bld: 96 mg/dL (ref 70–99)

## 2013-03-28 NOTE — Patient Instructions (Addendum)
We will check blood work today  Continue lasix 40 mg twice a day  I will see you in 3 weeks.

## 2013-03-28 NOTE — Progress Notes (Signed)
HPI Rose Weber is seen for follow up of CHF. She has a history of severe mitral valve prolapse. On her last visit she had significant increase in lower extremity edema. Her basic metabolic panel was normal. We increased her Lasix to 40 mg twice a day and increased her potassium to twice a day as well. Over the past week she has had a good diuresis. Her weight is down 7 pounds. Her edema has improved but is still significant. She denies any increase shortness of breath or dizziness. She has no chest pain or palpitations.  Allergies  Allergen Reactions  . Alphagan (Brimonidine Tartrate)   . Avelox (Moxifloxacin Hcl In Nacl)   . Benadryl (Diphenhydramine)   . Bystolic (Nebivolol Hcl)   . Diltiazem   . Diovan (Valsartan)   . Erythromycin   . Other     PRESERVATIVES  . Penicillins   . Polymyxin B-Trimethoprim   . Restasis (Cyclosporine)   . Sulfa Drugs Cross Reactors   . Tekturna (Aliskiren Fumarate)   . Travatan Z   . Xalatan (Latanoprost)     Current Outpatient Prescriptions on File Prior to Visit  Medication Sig Dispense Refill  . amLODipine (NORVASC) 2.5 MG tablet Take 1 tablet (2.5 mg total) by mouth daily.  60 tablet  6  . Artificial Tear Ointment (REFRESH LACRI-LUBE) OINT Apply 1 application to eye at bedtime. Apply in both eyes at bedtime for dry eyes.      . benazepril (LOTENSIN) 40 MG tablet Take 0.5 tablets (20 mg total) by mouth 2 (two) times daily.      . calcium carbonate (OS-CAL) 600 MG TABS Take 600 mg by mouth daily. Take 1 tablet daily.      . carboxymethylcellulose (REFRESH PLUS) 0.5 % SOLN Place 1 drop into both eyes as needed (dry eyes). Instill 5-6 drops a day in both eyes for dry eyes.      . Cholecalciferol (VITAMIN D) 2000 UNIT tablet Take 400 Units by mouth daily. Take 2 tablets daily for vitamin D supplement.      . cycloSPORINE (RESTASIS) 0.05 % ophthalmic emulsion Apply 1 drop to eye 3 (three) times daily.      Marland Kitchen estrogens, conjugated, (PREMARIN) 0.3 MG tablet  Take 0.3 mg by mouth See admin instructions. Take daily for 21 days then do not take for 7 days.      . furosemide (LASIX) 20 MG tablet Take 2 tablets (40 mg total) by mouth 2 (two) times daily.  120 tablet  0  . levothyroxine (SYNTHROID, LEVOTHROID) 25 MCG tablet Take 25 mcg by mouth daily. Take 1 tablet daily for thyroid.      . potassium chloride SA (K-DUR,KLOR-CON) 20 MEQ tablet Take 20 meq twice a day  60 tablet  6  . prednisoLONE acetate (PRED FORTE) 1 % ophthalmic suspension Place 1 drop into both eyes daily. Right eye three times a day; left eye daily      . timolol (TIMOPTIC) 0.5 % ophthalmic solution Place 1 drop into both eyes at bedtime. Instill 1 drop in each eye nightly.      . vitamin C (ASCORBIC ACID) 500 MG tablet Take 500 mg by mouth daily. Take 1 tablet daily for vitamin Csupplement      . vitamin E 400 UNIT capsule Take 400 Units by mouth daily. Take 1 tablet daily for vitamin E supplement.      . warfarin (COUMADIN) 2.5 MG tablet Take 1 tablet (2.5 mg total) by mouth daily.  Hold dose 5/28. Adjust to keep INR between 2.0 and 3.0.       No current facility-administered medications on file prior to visit.    Past Medical History  Diagnosis Date  . HTN (hypertension)   . MVP (mitral valve prolapse)   . Mitral insufficiency     SEVERE  . Chronic anticoagulation   . Edema   . GERD (gastroesophageal reflux disease)   . Personal history of goiter   . Hypothyroidism   . CHF (congestive heart failure)   . Mitral valve prolapse     With severe mitral insufficiency  . Atrial fibrillation   . Goiter     History of goiter  . PNA (pneumonia)     strep  5/14  . Other malaise and fatigue 01/25/2012  . Visual disturbances 10/12/2010  . Benign neoplasm of colon 04/09/2003  . Cardiac dysrhythmia, unspecified 09/26/2000  . Other corneal degenerations 02/19/1986    Fuchs corneal dystrophy    Past Surgical History  Procedure Laterality Date  . Cardioversion  04/14/10  . Appendectomy     . Small intestine surgery      SBO 2000  . Abdominal hysterectomy    . Corneal transplant      MULTIPLE  . Rectal polypectomy      Family History  Problem Relation Age of Onset  . Hip fracture Sister   . Stroke Father   . Alzheimer's disease Brother   . Stroke Sister     History   Social History  . Marital Status: Widowed    Spouse Name: N/A    Number of Children: 1  . Years of Education: N/A   Occupational History  . 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 Smoking status: Never Smoker   . Smokeless tobacco: Never Used  . Alcohol Use: No  . Drug Use: No  . Sexually Active: No   Other Topics Concern  . Not on file   Social History Narrative   Lives at [facility].     ROS  As noted in history of present illness.  All other systems were reviewed and are negative.  PHYSICAL EXAM BP 128/68  Pulse 66  Ht 5\' 1"  (1.549 m)  Wt 105 lb 1.9 oz (47.682 kg)  BMI 19.87 kg/m2  SpO2 90% The patient is alert and oriented x 3.    The skin is warm and dry.  Color is normal.  The HEENT exam reveals that the sclera are nonicteric. PERRLA.   The mucous membranes are moist.  The carotids are 2+ without bruits. There is a radiated murmur from the heart.  There is no thyromegaly.  There is no JVD.  The lungs are clear.    The heart exam reveals an irregular rate with a normal S1 and S2.  There  is a loud 4/6 systolic murmur at the apex radiating across the precordium and into the back. The PMI is not displaced.   Abdominal exam reveals good bowel sounds.    There are no masses.  Exam of the legs reveal no clubbing or cyanosis.  There is 2+  edema to the level of the knee.The distal pulses are intact. Redness has resolved. Cranial nerves II - XII are intact.  Motor and sensory functions are intact.    Laboratory data:  Basic metabolic panel is pending today.  ASSESSMENT AND PLAN  1. Congestive heart failure secondary to severe mitral insufficiency. She  has a  good response to increased diuretics with 7 pound weight loss over the past week and improvement in her edema. I will continue her Lasix to 40 mg twice a day. We will check a basic metabolic panel today. Continue sodium restriction. I'll followup again in 3 weeks.  2. Atrial fibrillation. Rate is well controlled on no rate controlling medication. She is on chronic Coumadin therapy.  3. Mitral valve prolapse with severe mitral insufficiency. She is not a candidate for surgical repair. We have discussed the mitral clip procedure in the past but she is not interested.  4. Hypothyroidism.

## 2013-04-01 DIAGNOSIS — Z961 Presence of intraocular lens: Secondary | ICD-10-CM | POA: Insufficient documentation

## 2013-04-03 LAB — BASIC METABOLIC PANEL
BUN: 31 mg/dL — AB (ref 4–21)
Creatinine: 0.8 mg/dL (ref 0.5–1.1)

## 2013-04-08 ENCOUNTER — Ambulatory Visit (INDEPENDENT_AMBULATORY_CARE_PROVIDER_SITE_OTHER): Payer: Managed Care, Other (non HMO) | Admitting: Cardiology

## 2013-04-08 DIAGNOSIS — I4891 Unspecified atrial fibrillation: Secondary | ICD-10-CM

## 2013-04-08 LAB — POCT INR: INR: 3.1

## 2013-04-10 ENCOUNTER — Encounter: Payer: Self-pay | Admitting: Internal Medicine

## 2013-04-11 ENCOUNTER — Other Ambulatory Visit: Payer: Self-pay

## 2013-04-11 MED ORDER — AMLODIPINE BESYLATE 2.5 MG PO TABS: 2.5000 mg | ORAL_TABLET | Freq: Every day | ORAL | Status: DC

## 2013-04-11 NOTE — Telephone Encounter (Signed)
amLODipine (NORVASC) 2.5 MG tablet  Take 1 tablet (2.5 mg total) by mouth daily.   60 tablet   6   Peter M [place], MD at 03/28/2013 12:07 PM  Patient Instructions:  We will check blood work today Continue lasix 40 mg twice a day I will see you in 3 weeks.

## 2013-04-17 ENCOUNTER — Encounter: Payer: Self-pay | Admitting: Nurse Practitioner

## 2013-04-22 ENCOUNTER — Ambulatory Visit (INDEPENDENT_AMBULATORY_CARE_PROVIDER_SITE_OTHER): Payer: Medicare Other | Admitting: Pharmacist

## 2013-04-22 DIAGNOSIS — I4891 Unspecified atrial fibrillation: Secondary | ICD-10-CM

## 2013-04-23 ENCOUNTER — Ambulatory Visit (INDEPENDENT_AMBULATORY_CARE_PROVIDER_SITE_OTHER): Payer: Medicare Other | Admitting: Cardiology

## 2013-04-23 ENCOUNTER — Encounter: Payer: Self-pay | Admitting: Cardiology

## 2013-04-23 VITALS — BP 120/74 | HR 80 | Ht 61.0 in | Wt 106.1 lb

## 2013-04-23 DIAGNOSIS — I509 Heart failure, unspecified: Secondary | ICD-10-CM

## 2013-04-23 DIAGNOSIS — I341 Nonrheumatic mitral (valve) prolapse: Secondary | ICD-10-CM

## 2013-04-23 DIAGNOSIS — I059 Rheumatic mitral valve disease, unspecified: Secondary | ICD-10-CM

## 2013-04-23 DIAGNOSIS — I10 Essential (primary) hypertension: Secondary | ICD-10-CM

## 2013-04-23 DIAGNOSIS — R609 Edema, unspecified: Secondary | ICD-10-CM

## 2013-04-23 DIAGNOSIS — I4891 Unspecified atrial fibrillation: Secondary | ICD-10-CM

## 2013-04-23 MED ORDER — FUROSEMIDE 20 MG PO TABS: 60.0000 mg | ORAL_TABLET | Freq: Two times a day (BID) | ORAL | Status: DC

## 2013-04-23 NOTE — Progress Notes (Signed)
HPI Mrs. Borrero is seen for follow up of CHF. She has a history of severe mitral valve prolapse. Earlier this summer she experienced increasing edema and shortness of breath. We increased her Lasix to 40 mg twice a day and she had a good diuresis and lost 7 pounds. Since her last visit however her weight has stabilized. She still has significant edema below the knees. Her shortness of breath has resolved. She denies any dizziness or palpitations. She is planning to have eye surgery in 2 weeks.  Allergies  Allergen Reactions  . Alphagan [Brimonidine Tartrate]   . Avelox [Moxifloxacin Hcl In Nacl]   . Benadryl [Diphenhydramine]   . Bystolic [Nebivolol Hcl]   . Diltiazem   . Diovan [Valsartan]   . Erythromycin   . Other     PRESERVATIVES  . Penicillins   . Polymyxin B-Trimethoprim   . Restasis [Cyclosporine]   . Sulfa Drugs Cross Reactors   . Tekturna [Aliskiren Fumarate]   . Travatan Z   . Xalatan [Latanoprost]     Current Outpatient Prescriptions on File Prior to Visit  Medication Sig Dispense Refill  . Artificial Tear Ointment (REFRESH LACRI-LUBE) OINT Apply 1 application to eye at bedtime. Apply in both eyes at bedtime for dry eyes.      . benazepril (LOTENSIN) 40 MG tablet Take 0.5 tablets (20 mg total) by mouth 2 (two) times daily.      . calcium carbonate (OS-CAL) 600 MG TABS Take 600 mg by mouth daily. Take 1 tablet daily.      . carboxymethylcellulose (REFRESH PLUS) 0.5 % SOLN Place 1 drop into both eyes as needed (dry eyes). Instill 5-6 drops a day in both eyes for dry eyes.      . Cholecalciferol (VITAMIN D) 2000 UNIT tablet Take 400 Units by mouth daily. Take 2 tablets daily for vitamin D supplement.      . cycloSPORINE (RESTASIS) 0.05 % ophthalmic emulsion Apply 1 drop to eye 3 (three) times daily.      Marland Kitchen estrogens, conjugated, (PREMARIN) 0.3 MG tablet Take 0.3 mg by mouth See admin instructions. Take daily for 21 days then do not take for 7 days.      Marland Kitchen levothyroxine  (SYNTHROID, LEVOTHROID) 25 MCG tablet Take 25 mcg by mouth daily. Take 1 tablet daily for thyroid.      . potassium chloride SA (K-DUR,KLOR-CON) 20 MEQ tablet Take 20 meq twice a day  60 tablet  6  . prednisoLONE acetate (PRED FORTE) 1 % ophthalmic suspension Place 1 drop into both eyes daily. Right eye three times a day; left eye daily      . timolol (TIMOPTIC) 0.5 % ophthalmic solution Place 1 drop into both eyes at bedtime. Instill 1 drop in each eye nightly.      . vitamin C (ASCORBIC ACID) 500 MG tablet Take 500 mg by mouth daily. Take 1 tablet daily for vitamin Csupplement      . vitamin E 400 UNIT capsule Take 400 Units by mouth daily. Take 1 tablet daily for vitamin E supplement.      . warfarin (COUMADIN) 2.5 MG tablet Take 1 tablet (2.5 mg total) by mouth daily. Hold dose 5/28. Adjust to keep INR between 2.0 and 3.0.       No current facility-administered medications on file prior to visit.    Past Medical History  Diagnosis Date  . HTN (hypertension)   . MVP (mitral valve prolapse)   . Mitral insufficiency  SEVERE  . Chronic anticoagulation   . Edema   . GERD (gastroesophageal reflux disease)   . Personal history of goiter   . Hypothyroidism   . CHF (congestive heart failure)   . Mitral valve prolapse     With severe mitral insufficiency  . Atrial fibrillation   . Goiter     History of goiter  . PNA (pneumonia)     strep  5/14  . Other malaise and fatigue 01/25/2012  . Visual disturbances 10/12/2010  . Benign neoplasm of colon 04/09/2003  . Cardiac dysrhythmia, unspecified 09/26/2000  . Other corneal degenerations 02/19/1986    Fuchs corneal dystrophy    Past Surgical History  Procedure Laterality Date  . Cardioversion  04/14/10  . Appendectomy    . Small intestine surgery      SBO 2000  . Abdominal hysterectomy    . Corneal transplant      MULTIPLE  . Rectal polypectomy      Family History  Problem Relation Age of Onset  . Hip fracture Sister   . Stroke  Father   . Alzheimer's disease Brother   . Stroke Sister     History   Social History  . Marital Status: Widowed    Spouse Name: N/A    Number of Children: 1  . Years of Education: N/A   Occupational History  . 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 Smoking status: Never Smoker   . Smokeless tobacco: Never Used  . Alcohol Use: No  . Drug Use: No  . Sexual Activity: No   Other Topics Concern  . Not on file   Social History Narrative   Lives at [facility]     ROS  As noted in history of present illness.  All other systems were reviewed and are negative.  PHYSICAL EXAM BP 120/74  Pulse 80  Ht 5\' 1"  (1.549 m)  Wt 106 lb 1.9 oz (48.136 kg)  BMI 20.06 kg/m2  SpO2 92% The patient is alert and oriented x 3.    The skin is warm and dry.  Color is normal.  The HEENT exam reveals that the sclera are nonicteric. PERRLA.   The mucous membranes are moist.  The carotids are 2+ without bruits. There is a radiated murmur from the heart.  There is no thyromegaly.  There is no JVD.  The lungs are clear.    The heart exam reveals an irregular rate with a normal S1 and S2.  There  is a loud 4/6 systolic murmur at the apex radiating across the precordium and into the back. The PMI is not displaced.   Abdominal exam reveals good bowel sounds.    There are no masses.  Exam of the legs reveal no clubbing or cyanosis.  There is 2+  edema to the level of the knee.The distal pulses are intact. Redness has resolved. Cranial nerves II - XII are intact.  Motor and sensory functions are intact.    Laboratory data:  Lab Results  Component Value Date   WBC 7.8 01/11/2013   HGB 13.8 01/11/2013   HCT 39.1 01/11/2013   PLT 138* 01/11/2013   GLUCOSE 96 03/28/2013   CHOL 168 08/10/2011   TRIG 46.0 08/10/2011   HDL 77.70 08/10/2011   LDLCALC 81 08/10/2011   ALT 29 01/10/2013   AST 57* 01/10/2013   NA 139 03/28/2013   K 3.9 03/28/2013   CL 101 03/28/2013  CREATININE 0.7 03/28/2013    BUN 27* 03/28/2013   CO2 32 03/28/2013   TSH 1.94 03/20/2013   INR 3.6 04/22/2013     ASSESSMENT AND PLAN  1. Congestive heart failure secondary to severe mitral insufficiency. Initially good response to increased diuretics but this has plateaued. I recommended increasing her Lasix to 60 mg twice a day. Fortunately her renal function has remained stable. I've also recommended stopping amlodipine since this may contribute to her edema. Continue sodium restriction. I'll followup again in 2 months with a basic metabolic panel.  2. Atrial fibrillation. Rate is well controlled on no rate controlling medication. She is on chronic Coumadin therapy. Recent on our was 3.6.  3. Mitral valve prolapse with severe mitral insufficiency. She is not a candidate for surgical repair. We have discussed the mitral clip procedure in the past but she is not interested.  4. Hypothyroidism.

## 2013-04-23 NOTE — Patient Instructions (Signed)
Increase Lasix to 60 mg ( 3 x 20 mg ) twice a day  Stop taking amlodipine.  Continue your other medications.  I will see you in 2 months.

## 2013-04-30 ENCOUNTER — Other Ambulatory Visit: Payer: Self-pay | Admitting: Cardiology

## 2013-05-01 ENCOUNTER — Encounter: Payer: Medicare Other | Admitting: Nurse Practitioner

## 2013-05-05 ENCOUNTER — Ambulatory Visit (INDEPENDENT_AMBULATORY_CARE_PROVIDER_SITE_OTHER): Payer: Medicare Other | Admitting: Pharmacist

## 2013-05-05 DIAGNOSIS — I4891 Unspecified atrial fibrillation: Secondary | ICD-10-CM

## 2013-05-05 LAB — POCT INR: INR: 2.1

## 2013-05-06 DIAGNOSIS — Z8679 Personal history of other diseases of the circulatory system: Secondary | ICD-10-CM | POA: Insufficient documentation

## 2013-05-06 DIAGNOSIS — Z87898 Personal history of other specified conditions: Secondary | ICD-10-CM | POA: Insufficient documentation

## 2013-05-15 ENCOUNTER — Ambulatory Visit: Payer: Medicare Other | Admitting: Cardiology

## 2013-05-23 ENCOUNTER — Other Ambulatory Visit: Payer: Self-pay | Admitting: Cardiology

## 2013-05-24 ENCOUNTER — Other Ambulatory Visit: Payer: Self-pay | Admitting: Cardiology

## 2013-05-26 ENCOUNTER — Other Ambulatory Visit: Payer: Self-pay | Admitting: *Deleted

## 2013-05-26 ENCOUNTER — Encounter: Payer: Self-pay | Admitting: Internal Medicine

## 2013-05-26 MED ORDER — WARFARIN SODIUM 2.5 MG PO TABS: 2.5000 mg | ORAL_TABLET | ORAL | Status: DC

## 2013-05-29 ENCOUNTER — Non-Acute Institutional Stay: Payer: Medicare Other | Admitting: Nurse Practitioner

## 2013-05-29 ENCOUNTER — Encounter: Payer: Self-pay | Admitting: Nurse Practitioner

## 2013-05-29 VITALS — BP 138/60 | HR 80 | Ht 61.0 in | Wt 101.0 lb

## 2013-05-29 DIAGNOSIS — I10 Essential (primary) hypertension: Secondary | ICD-10-CM

## 2013-05-29 DIAGNOSIS — R609 Edema, unspecified: Secondary | ICD-10-CM

## 2013-05-29 DIAGNOSIS — I509 Heart failure, unspecified: Secondary | ICD-10-CM

## 2013-05-29 DIAGNOSIS — E039 Hypothyroidism, unspecified: Secondary | ICD-10-CM

## 2013-05-29 DIAGNOSIS — I4891 Unspecified atrial fibrillation: Secondary | ICD-10-CM

## 2013-05-29 NOTE — Assessment & Plan Note (Signed)
Blood pressure has been controlled on Benazepril 40mg  bid, Lasix 120mg  daily.

## 2013-05-29 NOTE — Assessment & Plan Note (Addendum)
Compensated clinically. BNP 709.78/14/14--Furosemide 60mg  bid--continue to monitor her weights-keep log to next appointment. F/u Cardiology regularly--next appointment Nov 2014

## 2013-05-29 NOTE — Assessment & Plan Note (Signed)
Rate controlled and takes Coumadin for PE/DVT prophylaxis.        

## 2013-05-29 NOTE — Assessment & Plan Note (Signed)
the patient stated this not new, no weight gain so far, continue daily weight, f/u Cardiology next month. Takes Furosemide 60mg  bid.

## 2013-05-29 NOTE — Progress Notes (Signed)
Patient ID: Rose Weber, female   DOB: Aug 27, 1912, 79 y.o.   MRN: [id number]  Code Status: DNR  Allergies  Allergen Reactions  . Alphagan [Brimonidine Tartrate]   . Avelox [Moxifloxacin Hcl In Nacl]   . Benadryl [Diphenhydramine]   . Bystolic [Nebivolol Hcl]   . Diltiazem   . Diovan [Valsartan]   . Erythromycin   . Other     PRESERVATIVES  . Penicillins   . Polymyxin B-Trimethoprim   . Restasis [Cyclosporine]   . Sulfa Drugs Cross Reactors   . Tekturna [Aliskiren Fumarate]   . Travatan Z   . Xalatan [Latanoprost]     Chief Complaint  Patient presents with  . Medical Managment of Chronic Issues    blood pressure, edema.  Had left eye surgery 05/12/13, last seen Dr. Chilton Si 02/13/13.    HPI: Patient is a 79 y.o. female seen in the clinic at [facility] today for edema, blood pressure,  and other chronic medical conditions.  Problem List Items Addressed This Visit   Atrial fibrillation     Rate controlled and takes Coumadin for PE/DVT prophylaxis.         Relevant Medications      benazepril (LOTENSIN) 40 MG tablet      warfarin (COUMADIN) 2.5 MG tablet   CHF (congestive heart failure) - Primary     Compensated clinically. BNP 709.78/14/14--Furosemide 60mg  bid--continue to monitor her weights-keep log to next appointment. F/u Cardiology regularly--next appointment Nov 2014      Relevant Medications      benazepril (LOTENSIN) 40 MG tablet      warfarin (COUMADIN) 2.5 MG tablet   Edema (Chronic)     the patient stated this not new, no weight gain so far, continue daily weight, f/u Cardiology next month. Takes Furosemide 60mg  bid.         HTN (hypertension)     Blood pressure has been controlled on Benazepril 40mg  bid, Lasix 120mg  daily.         Relevant Medications      benazepril (LOTENSIN) 40 MG tablet      warfarin (COUMADIN) 2.5 MG tablet   Hypothyroidism     Takes Levothyroxine . TSH 1.53 04/03/13         Other Visit Diagnoses   Cubital tunnel syndrome on left           Review of Systems:  Review of Systems  Constitutional: Negative for fever, chills, weight loss, malaise/fatigue and diaphoresis.  HENT: Positive for hearing loss. Negative for congestion, ear discharge and sore throat.   Eyes: Positive for blurred vision. Negative for double vision, photophobia, pain, discharge and redness.       Legally blind, had corneal transplants 4x the right and 2x the left.   Respiratory: Positive for cough (hacking). Negative for hemoptysis, sputum production, shortness of breath and wheezing.   Cardiovascular: Positive for leg swelling (chronic pedal edema. ). Negative for chest pain, palpitations, orthopnea, claudication and PND.  Gastrointestinal: Negative for heartburn, nausea, vomiting, abdominal pain, diarrhea, constipation, blood in stool and melena.  Genitourinary: Negative for dysuria, urgency, frequency, hematuria and flank pain.  Musculoskeletal: Negative for back pain, falls, joint pain, myalgias and neck pain.  Skin: Negative for itching and rash.  Neurological: Negative for dizziness, tingling, tremors, sensory change, speech change, focal weakness, seizures, loss of consciousness, weakness and headaches.  Endo/Heme/Allergies: Negative for environmental allergies and polydipsia. Bruises/bleeds easily.  Psychiatric/Behavioral: Negative for depression, hallucinations and memory loss. The patient  is not nervous/anxious and does not have insomnia.      Past Medical History  Diagnosis Date  . HTN (hypertension)   . MVP (mitral valve prolapse)   . Mitral insufficiency     SEVERE  . Chronic anticoagulation   . Edema   . GERD (gastroesophageal reflux disease)   . Personal history of goiter   . Hypothyroidism   . CHF (congestive heart failure)   . Mitral valve prolapse     With severe mitral insufficiency  . Atrial fibrillation   . Goiter     History of goiter  . PNA (pneumonia)     strep  5/14  . Other  malaise and fatigue 01/25/2012  . Visual disturbances 10/12/2010  . Benign neoplasm of colon 04/09/2003  . Cardiac dysrhythmia, unspecified 09/26/2000  . Other corneal degenerations 02/19/1986    Fuchs corneal dystrophy   Past Surgical History  Procedure Laterality Date  . Cardioversion  04/14/10  . Appendectomy    . Small intestine surgery      SBO 2000  . Abdominal hysterectomy    . Corneal transplant      MULTIPLE  . Rectal polypectomy    . Eye surgery Left replaced valve in eye   Social History:   reports that she has never smoked. She has never used smokeless tobacco. She reports that she does not drink alcohol or use illicit drugs.  Family History  Problem Relation Age of Onset  . Hip fracture Sister   . Stroke Father   . Alzheimer's disease Brother   . Stroke Sister     Medications: Patient's Medications  New Prescriptions   No medications on file  Previous Medications   CALCIUM CARBONATE (OS-CAL) 600 MG TABS    Take 600 mg by mouth daily. Take 1 tablet daily.   CARBOXYMETHYLCELLULOSE (REFRESH PLUS) 0.5 % SOLN    Place 1 drop into both eyes as needed (dry eyes). Instill 5-6 drops a day in both eyes for dry eyes.   CHOLECALCIFEROL (VITAMIN D) 2000 UNIT TABLET    Take 400 Units by mouth daily. Take 2 tablets daily for vitamin D supplement.   CYCLOSPORINE (RESTASIS) 0.05 % OPHTHALMIC EMULSION    Apply 1 drop to eye 3 (three) times daily.   ESTROGENS, CONJUGATED, (PREMARIN) 0.3 MG TABLET    Take 0.3 mg by mouth See admin instructions. Take daily for 20 days then do not take for 10 days.   FUROSEMIDE (LASIX) 20 MG TABLET    Take 3 tablets (60 mg total) by mouth 2 (two) times daily.   LEVOTHYROXINE (SYNTHROID, LEVOTHROID) 25 MCG TABLET    Take 25 mcg by mouth daily. Take 1 tablet daily for thyroid.   POTASSIUM CHLORIDE SA (K-DUR,KLOR-CON) 20 MEQ TABLET    Take 20 meq twice a day   PREDNISOLONE ACETATE (PRED FORTE) 1 % OPHTHALMIC SUSPENSION    Place 1 drop into both eyes daily.  Right eye three times a day; left eye four times daily   TIMOLOL (TIMOPTIC) 0.5 % OPHTHALMIC SOLUTION    Place 1 drop into both eyes 2 (two) times daily. Instill 1 drop in each eye nightly.   VITAMIN C (ASCORBIC ACID) 500 MG TABLET    Take 500 mg by mouth daily. Take 1 tablet daily for vitamin Csupplement   VITAMIN E 400 UNIT CAPSULE    Take 400 Units by mouth daily. Take 1 tablet daily for vitamin E supplement.  Modified Medications   Modified Medication  Previous Medication   BENAZEPRIL (LOTENSIN) 40 MG TABLET benazepril (LOTENSIN) 40 MG tablet      Take 20 mg by mouth daily. Take one tablet twice daily for blood pressure    Take 0.5 tablets (20 mg total) by mouth 2 (two) times daily.   WARFARIN (COUMADIN) 2.5 MG TABLET warfarin (COUMADIN) 2.5 MG tablet      Take 2.5 mg by mouth as directed. Take one tablet T, Th, Sat; two tablets M, W, F, Sunday    Take 1 tablet (2.5 mg total) by mouth as directed.  Discontinued Medications   ARTIFICIAL TEAR OINTMENT (REFRESH LACRI-LUBE) OINT    Apply 1 application to eye at bedtime. Apply in both eyes at bedtime for dry eyes.     Physical Exam: Physical Exam  Constitutional: She is oriented to person, place, and time. She appears well-developed and well-nourished. No distress.  HENT:  Head: Normocephalic and atraumatic.  Right Ear: External ear normal.  Left Ear: External ear normal.  Nose: Nose normal.  Mouth/Throat: Oropharynx is clear and moist. No oropharyngeal exudate.  Eyes: Conjunctivae and EOM are normal. Pupils are equal, round, and reactive to light. Right eye exhibits no discharge. Left eye exhibits no discharge. No scleral icterus.  Corneal transplants.   Neck: Normal range of motion. Neck supple. No JVD present. No thyromegaly (goitor ) present.  Cardiovascular: Normal rate.  An irregular rhythm present.  Murmur heard.  Systolic murmur is present with a grade of 4/6  2+ pedal edema  Pulmonary/Chest: Effort normal. No stridor. No  respiratory distress. She has no wheezes. She has rales in the right lower field and the left lower field.  Abdominal: Soft. Bowel sounds are normal. She exhibits no distension. There is no tenderness. There is no rebound.  Musculoskeletal: Normal range of motion. She exhibits no edema and no tenderness.  Lymphadenopathy:    She has no cervical adenopathy.  Neurological: She is alert and oriented to person, place, and time. She has normal reflexes. She displays normal reflexes. No cranial nerve deficit. She exhibits normal muscle tone. Coordination normal.  Skin: Skin is warm and dry. No rash noted. She is not diaphoretic. No erythema.  Psychiatric: She has a normal mood and affect. Her behavior is normal. Judgment and thought content normal.    Filed Vitals:   05/29/13 1434  BP: 138/60  Pulse: 80  Height: 5\' 1"  (1.549 m)  Weight: 101 lb (45.813 kg)      Labs reviewed: Basic Metabolic Panel:  Recent Labs  16/10/96 1213  01/12/13 0837 01/16/13 03/20/13 1059 03/28/13 1206 04/03/13  NA  --   < > 134* 138 140 139 139  K  --   < > 3.6 4.1 4.1 3.9 4.2  CL  --   < > 98  --  105 101  --   CO2  --   < > 27  --  30 32  --   GLUCOSE  --   < > 114*  --  96 96  --   BUN  --   < > 20 27* 24* 27* 31*  CREATININE  --   < > 0.54 0.9 0.6 0.7 0.8  CALCIUM  --   < > 8.3*  --  8.9 8.9  --   MG 1.8  --   --   --   --   --   --   TSH  --   < >  --   --  1.94  --  1.53  < > = values in this interval not displayed. Liver Function Tests:  Recent Labs  01/10/13 1212  AST 57*  ALT 29  ALKPHOS 107  BILITOT 1.5*  PROT 6.9  ALBUMIN 3.4*   CBC:  Recent Labs  01/10/13 1212 01/11/13 0640  WBC 6.2 7.8  NEUTROABS 4.8 6.3  HGB 14.5 13.8  HCT 43.2 39.1  MCV 91.9 89.5  PLT 144* 138*   Assessment/Plan CHF (congestive heart failure) Compensated clinically. BNP 709.78/14/14--Furosemide 60mg  bid--continue to monitor her weights-keep log to next appointment. F/u Cardiology regularly--next  appointment Nov 2014    Hypothyroidism Takes Levothyroxine . TSH 1.53 04/03/13      Atrial fibrillation Rate controlled and takes Coumadin for PE/DVT prophylaxis.       HTN (hypertension) Blood pressure has been controlled on Benazepril 40mg  bid, Lasix 120mg  daily.       Edema the patient stated this not new, no weight gain so far, continue daily weight, f/u Cardiology next month. Takes Furosemide 60mg  bid.         Family/ Staff Communication: observe the patient.   Goals of Care: [place]  Labs/tests ordered: none

## 2013-05-29 NOTE — Assessment & Plan Note (Signed)
Takes Levothyroxine . TSH 1.53 04/03/13

## 2013-05-29 NOTE — Assessment & Plan Note (Deleted)
The left 4th and 5th fingers decreased grip strength and coordination-drop her hearing aids and experienced tingling sensation on/off--will obtain X-ray C-spine and elbow(3 views) to evaluate further. Continue therapy.

## 2013-06-02 ENCOUNTER — Other Ambulatory Visit: Payer: Self-pay | Admitting: Cardiology

## 2013-06-02 ENCOUNTER — Ambulatory Visit (INDEPENDENT_AMBULATORY_CARE_PROVIDER_SITE_OTHER): Payer: Managed Care, Other (non HMO) | Admitting: Pharmacist

## 2013-06-02 DIAGNOSIS — I4891 Unspecified atrial fibrillation: Secondary | ICD-10-CM

## 2013-06-02 LAB — POCT INR: INR: 2.1

## 2013-06-16 ENCOUNTER — Ambulatory Visit (INDEPENDENT_AMBULATORY_CARE_PROVIDER_SITE_OTHER): Payer: Managed Care, Other (non HMO) | Admitting: Pharmacist

## 2013-06-16 DIAGNOSIS — I4891 Unspecified atrial fibrillation: Secondary | ICD-10-CM

## 2013-06-16 LAB — POCT INR: INR: 2.2

## 2013-06-25 ENCOUNTER — Other Ambulatory Visit: Payer: Self-pay | Admitting: *Deleted

## 2013-06-25 MED ORDER — LEVOTHYROXINE SODIUM 25 MCG PO TABS: 25.0000 ug | ORAL_TABLET | Freq: Every day | ORAL | Status: DC

## 2013-06-25 NOTE — Telephone Encounter (Signed)
rx filled per protocol  

## 2013-07-02 ENCOUNTER — Ambulatory Visit: Payer: Medicare Other | Admitting: Cardiology

## 2013-07-03 ENCOUNTER — Ambulatory Visit: Payer: Medicare Other | Admitting: Cardiology

## 2013-07-07 ENCOUNTER — Ambulatory Visit (INDEPENDENT_AMBULATORY_CARE_PROVIDER_SITE_OTHER): Payer: Managed Care, Other (non HMO) | Admitting: Pharmacist

## 2013-07-07 DIAGNOSIS — I4891 Unspecified atrial fibrillation: Secondary | ICD-10-CM

## 2013-07-07 LAB — POCT INR: INR: 1.9

## 2013-07-08 ENCOUNTER — Encounter: Payer: Self-pay | Admitting: Cardiology

## 2013-07-08 DIAGNOSIS — I4891 Unspecified atrial fibrillation: Secondary | ICD-10-CM

## 2013-07-08 NOTE — Progress Notes (Signed)
This encounter was created in error - please disregard.

## 2013-07-09 DIAGNOSIS — H318 Other specified disorders of choroid: Secondary | ICD-10-CM | POA: Insufficient documentation

## 2013-07-21 HISTORY — PX: EYE SURGERY: SHX253

## 2013-07-29 ENCOUNTER — Ambulatory Visit (INDEPENDENT_AMBULATORY_CARE_PROVIDER_SITE_OTHER): Payer: Managed Care, Other (non HMO) | Admitting: Pharmacist

## 2013-07-29 DIAGNOSIS — I4891 Unspecified atrial fibrillation: Secondary | ICD-10-CM

## 2013-07-29 LAB — POCT INR: INR: 2.1

## 2013-08-25 ENCOUNTER — Other Ambulatory Visit: Payer: Self-pay | Admitting: Internal Medicine

## 2013-08-27 ENCOUNTER — Ambulatory Visit (INDEPENDENT_AMBULATORY_CARE_PROVIDER_SITE_OTHER): Payer: Medicare Other | Admitting: Pharmacist

## 2013-08-27 DIAGNOSIS — I4891 Unspecified atrial fibrillation: Secondary | ICD-10-CM

## 2013-08-27 LAB — POCT INR: INR: 1.9

## 2013-08-28 ENCOUNTER — Other Ambulatory Visit: Payer: Self-pay | Admitting: Cardiology

## 2013-09-04 ENCOUNTER — Encounter: Payer: Self-pay | Admitting: Nurse Practitioner

## 2013-09-04 ENCOUNTER — Non-Acute Institutional Stay: Payer: Medicare Other | Admitting: Nurse Practitioner

## 2013-09-04 VITALS — BP 122/82 | HR 60 | Wt 97.0 lb

## 2013-09-04 DIAGNOSIS — R609 Edema, unspecified: Secondary | ICD-10-CM

## 2013-09-04 DIAGNOSIS — N951 Menopausal and female climacteric states: Secondary | ICD-10-CM

## 2013-09-04 DIAGNOSIS — I509 Heart failure, unspecified: Secondary | ICD-10-CM

## 2013-09-04 DIAGNOSIS — Z780 Asymptomatic menopausal state: Secondary | ICD-10-CM

## 2013-09-04 DIAGNOSIS — I10 Essential (primary) hypertension: Secondary | ICD-10-CM

## 2013-09-04 DIAGNOSIS — E039 Hypothyroidism, unspecified: Secondary | ICD-10-CM

## 2013-09-04 DIAGNOSIS — I4891 Unspecified atrial fibrillation: Secondary | ICD-10-CM

## 2013-09-04 NOTE — Assessment & Plan Note (Signed)
Rate controlled and takes Coumadin for PE/DVT prophylaxis.

## 2013-09-04 NOTE — Assessment & Plan Note (Addendum)
Blood pressure has been controlled on Benazepril 20mg  bid, Lasix 120mg  daily.

## 2013-09-04 NOTE — Assessment & Plan Note (Addendum)
Takes Levothyroxine 71mcg. TSH 1.53 04/03/13, update TSH and CBC

## 2013-09-04 NOTE — Assessment & Plan Note (Addendum)
Compensated clinically. BNP 709.78/14/14--Furosemide 60mg  bid--continue to monitor her weights-keep log to next appointment. F/u Cardiology

## 2013-09-04 NOTE — Progress Notes (Signed)
Patient ID: Rose Weber, female   DOB: 1912-10-10, 80 y.o.   MRN: [id number]      Code Status: DNR  Allergies  Allergen Reactions  . Alphagan [Brimonidine Tartrate]   . Avelox [Moxifloxacin Hcl In Nacl]   . Benadryl [Diphenhydramine]   . Bystolic [Nebivolol Hcl]   . Diltiazem   . Diovan [Valsartan]   . Erythromycin   . Other     PRESERVATIVES  . Penicillins   . Polymyxin B-Trimethoprim   . Restasis [Cyclosporine]   . Sulfa Drugs Cross Reactors   . Tekturna [Aliskiren Fumarate]   . Travatan Z   . Xalatan [Latanoprost]     Chief Complaint  Patient presents with  . Medical Managment of Chronic Issues    blood pressure, CHF, thyroid, A-Fib    HPI: Patient is a 80 y.o. female seen in the clinic at [facility] today for edema, blood pressure,  and other chronic medical conditions.  Problem List Items Addressed This Visit   Atrial fibrillation     Rate controlled and takes Coumadin for PE/DVT prophylaxis.           CHF (congestive heart failure)     Compensated clinically. BNP 709.78/14/14--Furosemide 60mg  bid--continue to monitor her weights-keep log to next appointment. F/u Cardiology        Edema (Chronic)     the patient stated this not new, no weight gain so far, continue daily weight, f/u Cardiology next month. Takes Furosemide 60mg  bid. Update CMP          HTN (hypertension)     Blood pressure has been controlled on Benazepril 20mg  bid, Lasix 120mg  daily.           Hypothyroidism - Primary     Takes Levothyroxine 86mcg. TSH 1.53 04/03/13, update TSH and CBC          Post menopausal syndrome     Takes Premarin 0.3mg  daily for 20 days then off for 10 days. The patient is 80 years of age and failed alternative in the past per the patient. Medically necessary to continue the same therapy.        Review of Systems:  Review of Systems  Constitutional: Negative for fever, chills, weight loss, malaise/fatigue and diaphoresis.    HENT: Positive for hearing loss. Negative for congestion, ear discharge and sore throat.   Eyes: Positive for blurred vision. Negative for double vision, photophobia, pain, discharge and redness.       Legally blind, had corneal transplants 4x the right and 2x the left.   Respiratory: Negative for cough (hacking), hemoptysis, sputum production, shortness of breath and wheezing.   Cardiovascular: Positive for leg swelling (chronic pedal edema. ). Negative for chest pain, palpitations, orthopnea, claudication and PND.       Trace  Gastrointestinal: Negative for heartburn, nausea, vomiting, abdominal pain, diarrhea, constipation, blood in stool and melena.  Genitourinary: Negative for dysuria, urgency, frequency, hematuria and flank pain.  Musculoskeletal: Negative for back pain, falls, joint pain, myalgias and neck pain.  Skin: Negative for itching and rash.  Neurological: Negative for dizziness, tingling, tremors, sensory change, speech change, focal weakness, seizures, loss of consciousness, weakness and headaches.  Endo/Heme/Allergies: Negative for environmental allergies and polydipsia. Bruises/bleeds easily.  Psychiatric/Behavioral: Negative for depression, hallucinations and memory loss. The patient is not nervous/anxious and does not have insomnia.      Past Medical History  Diagnosis Date  . HTN (hypertension)   . MVP (mitral valve prolapse)   .  Mitral insufficiency     SEVERE  . Chronic anticoagulation   . Edema   . GERD (gastroesophageal reflux disease)   . Personal history of goiter   . Hypothyroidism   . CHF (congestive heart failure)   . Mitral valve prolapse     With severe mitral insufficiency  . Atrial fibrillation   . Goiter     History of goiter  . PNA (pneumonia)     strep  5/14  . Other malaise and fatigue 01/25/2012  . Visual disturbances 10/12/2010  . Benign neoplasm of colon 04/09/2003  . Cardiac dysrhythmia, unspecified 09/26/2000  . Other corneal  degenerations 02/19/1986    Fuchs corneal dystrophy   Past Surgical History  Procedure Laterality Date  . Cardioversion  04/14/10  . Appendectomy  1932  . Small intestine surgery      SBO 2000  . Abdominal hysterectomy  1962  . Corneal transplant      MULTIPLE  . Rectal polypectomy    . Eye surgery Left 07/2013    replaced valve in eye   Social History:   reports that she has never smoked. She has never used smokeless tobacco. She reports that she does not drink alcohol or use illicit drugs.  Family History  Problem Relation Age of Onset  . Hip fracture Sister   . Stroke Father   . Alzheimer's disease Brother   . Stroke Sister     Medications: Patient's Medications  New Prescriptions   No medications on file  Previous Medications   BENAZEPRIL (LOTENSIN) 40 MG TABLET    Take 20 mg by mouth daily. Take one tablet twice daily for blood pressure   CALCIUM CARBONATE (OS-CAL) 600 MG TABS    Take 600 mg by mouth daily. Take 1 tablet daily.   CARBOXYMETHYLCELLULOSE (REFRESH PLUS) 0.5 % SOLN    Place 1 drop into both eyes as needed (dry eyes). Instill 5-6 drops a day in both eyes for dry eyes.   CHOLECALCIFEROL (VITAMIN D) 2000 UNIT TABLET    Take 400 Units by mouth daily. Take 2 tablets daily for vitamin D supplement.   COUMADIN 2.5 MG TABLET    TAKE AS DIRECTED BY COUMADIN CLINIC.   CYCLOSPORINE (RESTASIS) 0.05 % OPHTHALMIC EMULSION    Apply 1 drop to eye 3 (three) times daily.   ESTROGENS, CONJUGATED, (PREMARIN) 0.3 MG TABLET    Take 0.3 mg by mouth See admin instructions. Take daily for 20 days then do not take for 10 days.   FUROSEMIDE (LASIX) 20 MG TABLET    Take 3 tablets (60 mg total) by mouth 2 (two) times daily.   POTASSIUM CHLORIDE SA (K-DUR,KLOR-CON) 20 MEQ TABLET    Take 20 meq twice a day   PREDNISOLONE ACETATE (PRED FORTE) 1 % OPHTHALMIC SUSPENSION    Place 1 drop into both eyes daily. Right eye three times a day; left eye two times daily   SYNTHROID 25 MCG TABLET    TAKE  1 TABLET ONCE DAILY FOR THYROID.   VITAMIN C (ASCORBIC ACID) 500 MG TABLET    Take 500 mg by mouth daily. Take 1 tablet daily for vitamin Csupplement   VITAMIN E 400 UNIT CAPSULE    Take 400 Units by mouth daily. Take 1 tablet daily for vitamin E supplement.  Modified Medications   No medications on file  Discontinued Medications   TIMOLOL (TIMOPTIC) 0.5 % OPHTHALMIC SOLUTION    Place 1 drop into both eyes 2 (two) times  daily. Instill 1 drop in each eye nightly.     Physical Exam: Physical Exam  Constitutional: She is oriented to person, place, and time. She appears well-developed and well-nourished. No distress.  HENT:  Head: Normocephalic and atraumatic.  Right Ear: External ear normal.  Left Ear: External ear normal.  Nose: Nose normal.  Mouth/Throat: Oropharynx is clear and moist. No oropharyngeal exudate.  Eyes: Conjunctivae and EOM are normal. Pupils are equal, round, and reactive to light. Right eye exhibits no discharge. Left eye exhibits no discharge. No scleral icterus.  Corneal transplants.   Neck: Normal range of motion. Neck supple. No JVD present. No thyromegaly (goitor ) present.  Cardiovascular: Normal rate.  An irregular rhythm present.  Murmur heard.  Systolic murmur is present with a grade of 4/6   Trace pedal edema. Systolic murmur [id number]  Pulmonary/Chest: Effort normal. No stridor. No respiratory distress. She has no wheezes. She has rales in the right lower field and the left lower field.  Abdominal: Soft. Bowel sounds are normal. She exhibits no distension. There is no tenderness. There is no rebound.  Musculoskeletal: Normal range of motion. She exhibits edema. She exhibits no tenderness.  Trace edema ankle.   Lymphadenopathy:    She has no cervical adenopathy.  Neurological: She is alert and oriented to person, place, and time. She has normal reflexes. No cranial nerve deficit. She exhibits normal muscle tone. Coordination normal.  Skin: Skin is warm and dry.  No rash noted. She is not diaphoretic. No erythema.  Psychiatric: She has a normal mood and affect. Her behavior is normal. Judgment and thought content normal.    Filed Vitals:   09/04/13 1505  BP: 122/82  Pulse: 60  Weight: 97 lb (43.999 kg)      Labs reviewed: Basic Metabolic Panel:  Recent Labs  01/10/13 1213  01/12/13 0837 01/16/13 03/20/13 1059 03/28/13 1206 04/03/13  NA  --   < > 134* 138 140 139 139  K  --   < > 3.6 4.1 4.1 3.9 4.2  CL  --   < > 98  --  105 101  --   CO2  --   < > 27  --  30 32  --   GLUCOSE  --   < > 114*  --  96 96  --   BUN  --   < > 20 27* 24* 27* 31*  CREATININE  --   < > 0.54 0.9 0.6 0.7 0.8  CALCIUM  --   < > 8.3*  --  8.9 8.9  --   MG 1.8  --   --   --   --   --   --   TSH  --   < >  --   --  1.94  --  1.53  < > = values in this interval not displayed. Liver Function Tests:  Recent Labs  01/10/13 1212  AST 57*  ALT 29  ALKPHOS 107  BILITOT 1.5*  PROT 6.9  ALBUMIN 3.4*   CBC:  Recent Labs  01/10/13 1212 01/11/13 0640  WBC 6.2 7.8  NEUTROABS 4.8 6.3  HGB 14.5 13.8  HCT 43.2 39.1  MCV 91.9 89.5  PLT 144* 138*   Assessment/Plan Hypothyroidism Takes Levothyroxine 31mcg. TSH 1.53 04/03/13, update TSH and CBC        HTN (hypertension) Blood pressure has been controlled on Benazepril 20mg  bid, Lasix 120mg  daily.  Edema the patient stated this not new, no weight gain so far, continue daily weight, f/u Cardiology next month. Takes Furosemide 60mg  bid. Update CMP        Atrial fibrillation Rate controlled and takes Coumadin for PE/DVT prophylaxis.         CHF (congestive heart failure) Compensated clinically. BNP 709.78/14/14--Furosemide 60mg  bid--continue to monitor her weights-keep log to next appointment. F/u Cardiology      Post menopausal syndrome Takes Premarin 0.3mg  daily for 20 days then off for 10 days. The patient is 80 years of age and failed alternative in the past per the  patient. Medically necessary to continue the same therapy.     Family/ Staff Communication: observe the patient.   Goals of Care: [place]  Labs/tests ordered: TSH, CBC, CMP

## 2013-09-04 NOTE — Assessment & Plan Note (Signed)
the patient stated this not new, no weight gain so far, continue daily weight, f/u Cardiology next month. Takes Furosemide 60mg  bid. Update CMP

## 2013-09-05 ENCOUNTER — Telehealth: Payer: Self-pay

## 2013-09-05 NOTE — Telephone Encounter (Signed)
[employer] to get authorization, they will fax form to be completed for Premarin 0.3mg 

## 2013-09-06 DIAGNOSIS — N951 Menopausal and female climacteric states: Secondary | ICD-10-CM | POA: Insufficient documentation

## 2013-09-06 NOTE — Assessment & Plan Note (Signed)
Takes Premarin 0.3mg  daily for 20 days then off for 10 days. The patient is 80 years of age and failed alternative in the past per the patient. Medically necessary to continue the same therapy.

## 2013-09-17 ENCOUNTER — Ambulatory Visit (INDEPENDENT_AMBULATORY_CARE_PROVIDER_SITE_OTHER): Payer: Managed Care, Other (non HMO) | Admitting: Pharmacist

## 2013-09-17 DIAGNOSIS — I4891 Unspecified atrial fibrillation: Secondary | ICD-10-CM

## 2013-09-17 LAB — POCT INR: INR: 2

## 2013-10-01 ENCOUNTER — Other Ambulatory Visit: Payer: Self-pay | Admitting: Cardiology

## 2013-10-01 ENCOUNTER — Ambulatory Visit: Payer: Medicare Other | Admitting: Cardiology

## 2013-10-14 ENCOUNTER — Ambulatory Visit (INDEPENDENT_AMBULATORY_CARE_PROVIDER_SITE_OTHER): Payer: Managed Care, Other (non HMO) | Admitting: Cardiology

## 2013-10-14 DIAGNOSIS — I4891 Unspecified atrial fibrillation: Secondary | ICD-10-CM

## 2013-10-14 LAB — POCT INR: INR: 2

## 2013-10-15 ENCOUNTER — Ambulatory Visit: Payer: Managed Care, Other (non HMO) | Admitting: Cardiology

## 2013-10-21 ENCOUNTER — Encounter: Payer: Self-pay | Admitting: Cardiology

## 2013-10-31 ENCOUNTER — Other Ambulatory Visit: Payer: Self-pay | Admitting: Cardiology

## 2013-11-10 ENCOUNTER — Ambulatory Visit (INDEPENDENT_AMBULATORY_CARE_PROVIDER_SITE_OTHER): Payer: Managed Care, Other (non HMO) | Admitting: Pharmacist

## 2013-11-10 DIAGNOSIS — I4891 Unspecified atrial fibrillation: Secondary | ICD-10-CM

## 2013-11-10 LAB — POCT INR: INR: 1.4

## 2013-12-02 LAB — CBC AND DIFFERENTIAL
HCT: 37 % (ref 36–46)
Hemoglobin: 13.1 g/dL (ref 12.0–16.0)
PLATELETS: 251 10*3/uL (ref 150–399)
WBC: 6.8 10^3/mL

## 2013-12-02 LAB — BASIC METABOLIC PANEL
BUN: 16 mg/dL (ref 4–21)
Creatinine: 0.7 mg/dL (ref 0.5–1.1)
GLUCOSE: 101 mg/dL
POTASSIUM: 3.5 mmol/L (ref 3.4–5.3)
SODIUM: 138 mmol/L (ref 137–147)

## 2013-12-02 LAB — HEPATIC FUNCTION PANEL
ALK PHOS: 114 U/L (ref 25–125)
ALT: 11 U/L (ref 7–35)
AST: 19 U/L (ref 13–35)
BILIRUBIN, TOTAL: 1.2 mg/dL

## 2013-12-02 LAB — TSH: TSH: 1.01 u[IU]/mL (ref 0.41–5.90)

## 2013-12-08 ENCOUNTER — Ambulatory Visit (INDEPENDENT_AMBULATORY_CARE_PROVIDER_SITE_OTHER): Payer: Medicare Other | Admitting: Cardiology

## 2013-12-08 DIAGNOSIS — I4891 Unspecified atrial fibrillation: Secondary | ICD-10-CM

## 2013-12-08 LAB — POCT INR: INR: 1.5

## 2013-12-10 DIAGNOSIS — H18429 Band keratopathy, unspecified eye: Secondary | ICD-10-CM | POA: Insufficient documentation

## 2013-12-10 DIAGNOSIS — H538 Other visual disturbances: Secondary | ICD-10-CM | POA: Insufficient documentation

## 2013-12-11 ENCOUNTER — Encounter: Payer: Self-pay | Admitting: Nurse Practitioner

## 2013-12-18 ENCOUNTER — Encounter: Payer: Self-pay | Admitting: Internal Medicine

## 2013-12-22 ENCOUNTER — Other Ambulatory Visit: Payer: Self-pay | Admitting: Internal Medicine

## 2013-12-22 ENCOUNTER — Ambulatory Visit (INDEPENDENT_AMBULATORY_CARE_PROVIDER_SITE_OTHER): Payer: Medicare Other | Admitting: Cardiovascular Disease

## 2013-12-22 DIAGNOSIS — I4891 Unspecified atrial fibrillation: Secondary | ICD-10-CM

## 2013-12-22 LAB — POCT INR: INR: 2

## 2014-01-06 ENCOUNTER — Other Ambulatory Visit: Payer: Self-pay | Admitting: Cardiology

## 2014-01-13 ENCOUNTER — Ambulatory Visit (INDEPENDENT_AMBULATORY_CARE_PROVIDER_SITE_OTHER): Payer: Medicare Other | Admitting: Internal Medicine

## 2014-01-13 DIAGNOSIS — I4891 Unspecified atrial fibrillation: Secondary | ICD-10-CM

## 2014-01-13 LAB — POCT INR: INR: 1.7

## 2014-01-15 ENCOUNTER — Encounter: Payer: Self-pay | Admitting: Internal Medicine

## 2014-01-20 ENCOUNTER — Ambulatory Visit: Payer: Managed Care, Other (non HMO) | Admitting: Cardiology

## 2014-02-02 ENCOUNTER — Telehealth: Payer: Self-pay

## 2014-02-02 NOTE — Telephone Encounter (Signed)
Called patient to reschedule her appt from 01/15/14. Patient has moved to [place] at [facility] now. New appt 02/26/14 at 2:15, at [facility] clinic.

## 2014-02-17 DIAGNOSIS — H26499 Other secondary cataract, unspecified eye: Secondary | ICD-10-CM | POA: Insufficient documentation

## 2014-02-26 ENCOUNTER — Non-Acute Institutional Stay: Payer: Medicare Other | Admitting: Internal Medicine

## 2014-02-26 ENCOUNTER — Encounter: Payer: Self-pay | Admitting: Internal Medicine

## 2014-02-26 VITALS — BP 140/68 | HR 72 | Wt 98.0 lb

## 2014-02-26 DIAGNOSIS — I482 Chronic atrial fibrillation, unspecified: Secondary | ICD-10-CM

## 2014-02-26 DIAGNOSIS — I10 Essential (primary) hypertension: Secondary | ICD-10-CM

## 2014-02-26 DIAGNOSIS — I4891 Unspecified atrial fibrillation: Secondary | ICD-10-CM

## 2014-02-26 DIAGNOSIS — K219 Gastro-esophageal reflux disease without esophagitis: Secondary | ICD-10-CM

## 2014-02-26 DIAGNOSIS — Z7901 Long term (current) use of anticoagulants: Secondary | ICD-10-CM

## 2014-02-26 DIAGNOSIS — I340 Nonrheumatic mitral (valve) insufficiency: Secondary | ICD-10-CM

## 2014-02-26 DIAGNOSIS — R739 Hyperglycemia, unspecified: Secondary | ICD-10-CM

## 2014-02-26 DIAGNOSIS — R7309 Other abnormal glucose: Secondary | ICD-10-CM

## 2014-02-26 DIAGNOSIS — Z299 Encounter for prophylactic measures, unspecified: Secondary | ICD-10-CM

## 2014-02-26 DIAGNOSIS — I059 Rheumatic mitral valve disease, unspecified: Secondary | ICD-10-CM

## 2014-02-26 DIAGNOSIS — E039 Hypothyroidism, unspecified: Secondary | ICD-10-CM

## 2014-02-26 NOTE — Progress Notes (Signed)
Patient ID: Rose Weber, female   DOB: 1912/09/17, 80 y.o.   MRN: [id number]    Location:  [facility]   Place of Service: Clinic (12)    Allergies  Allergen Reactions  . Alphagan [Brimonidine Tartrate]   . Avelox [Moxifloxacin Hcl In Nacl]   . Benadryl [Diphenhydramine]   . Bystolic [Nebivolol Hcl]   . Diltiazem   . Diovan [Valsartan]   . Erythromycin   . Other     PRESERVATIVES  . Penicillins   . Polymyxin B-Trimethoprim   . Restasis [Cyclosporine]   . Sulfa Drugs Cross Reactors   . Tekturna [Aliskiren Fumarate]   . Travatan Z   . Xalatan [Latanoprost]     Chief Complaint  Patient presents with  . Medical Management of Chronic Issues    blood pressure, CHF, thyroid    HPI:  Preventive measure - Plan: DNR (Do Not Resuscitate)  Essential hypertension: controlled  Mitral insufficiency: loud murmur that is unchanged from the past  Chronic anticoagulation: for AF  Chronic atrial fibrillation: rate controlled. Present > 15 years  Gastroesophageal reflux disease without esophagitis: asymptomatic  Hypothyroidism, unspecified hypothyroidism type: controlled  Hyperglycemia: controlled    Medications: Patient's Medications  New Prescriptions   No medications on file  Previous Medications   CALCIUM CARBONATE (OS-CAL) 600 MG TABS    Take 600 mg by mouth daily. Take 1 tablet daily.   CARBOXYMETHYLCELLULOSE (REFRESH PLUS) 0.5 % SOLN    Place 1 drop into both eyes as needed (dry eyes). Instill 5-6 drops a day in both eyes for dry eyes.   CHOLECALCIFEROL (VITAMIN D) 2000 UNIT TABLET    Take 400 Units by mouth daily. Take 2 tablets daily for vitamin D supplement.   CYCLOSPORINE (RESTASIS) 0.05 % OPHTHALMIC EMULSION    Apply 1 drop to eye 3 (three) times daily.   ESTROGENS, CONJUGATED, (PREMARIN) 0.3 MG TABLET    Take 0.3 mg by mouth. Take daily for 21 days then do not take for 7 days.   FUROSEMIDE (LASIX) 20 MG TABLET    Take 3 tablets (60 mg total) by  mouth 2 (two) times daily.   LOTENSIN 20 MG TABLET    TAKE 1 TABLET TWICE DAILY.   POTASSIUM CHLORIDE SA (K-DUR,KLOR-CON) 20 MEQ TABLET    Take 20 meq twice a day   PREDNISOLONE ACETATE (PRED FORTE) 1 % OPHTHALMIC SUSPENSION    Place 1 drop into both eyes. One drop both eyes three times daily   SYNTHROID 25 MCG TABLET    TAKE 1 TABLET ONCE DAILY FOR THYROID.  Modified Medications   Modified Medication Previous Medication   WARFARIN (COUMADIN) 2.5 MG TABLET COUMADIN 2.5 MG tablet      take 5mg  on Mon, Wed, Frid, Sat; 2.5mg  Sun, Tue, Thur    TAKE AS DIRECTED BY COUMADIN CLINIC.  Discontinued Medications   VITAMIN C (ASCORBIC ACID) 500 MG TABLET    Take 500 mg by mouth daily. Take 1 tablet daily for vitamin Csupplement   VITAMIN E 400 UNIT CAPSULE    Take 400 Units by mouth daily. Take 1 tablet daily for vitamin E supplement.     Review of Systems  Constitutional: Negative for fever, diaphoresis, activity change, appetite change and fatigue.  HENT: Negative.   Eyes: Negative.   Respiratory: Negative.   Cardiovascular: Positive for palpitations and leg swelling.  Endocrine:       Hyperglycemia Hypothyroid. Goiter  Genitourinary: Negative.   Musculoskeletal: Negative.  Skin: Negative.   Allergic/Immunologic: Negative.   Neurological: Negative.   Hematological: Negative.   Psychiatric/Behavioral: Negative.     Filed Vitals:   02/26/14 1455  BP: 140/68  Pulse: 72  Weight: 98 lb (44.453 kg)  SpO2: 97%   Body mass index is 18.53 kg/(m^2).  Physical Exam  Constitutional: She is oriented to person, place, and time. She appears well-developed and well-nourished. No distress.  HENT:  Head: Normocephalic and atraumatic.  Right Ear: External ear normal.  Left Ear: External ear normal.  Nose: Nose normal.  Mouth/Throat: Oropharynx is clear and moist. No oropharyngeal exudate.  Eyes: Conjunctivae and EOM are normal. Pupils are equal, round, and reactive to light. Right eye exhibits  no discharge. Left eye exhibits no discharge. No scleral icterus.  Corneal transplants.   Neck: Normal range of motion. Neck supple. No JVD present. No thyromegaly (goitor ) present.  Cardiovascular: Normal rate.  An irregular rhythm present.  Murmur heard.  Systolic murmur is present with a grade of 4/6   Trace pedal edema. Systolic murmur 3-7/8  Pulmonary/Chest: Effort normal. No stridor. No respiratory distress. She has no wheezes. She has rales in the right lower field and the left lower field.  Abdominal: Soft. Bowel sounds are normal. She exhibits no distension. There is no tenderness. There is no rebound.  Musculoskeletal: Normal range of motion. She exhibits edema. She exhibits no tenderness.  Trace edema ankle.   Lymphadenopathy:    She has no cervical adenopathy.  Neurological: She is alert and oriented to person, place, and time. She has normal reflexes. No cranial nerve deficit. She exhibits normal muscle tone. Coordination normal.  Skin: Skin is warm and dry. No rash noted. She is not diaphoretic. No erythema.  Psychiatric: She has a normal mood and affect. Her behavior is normal. Judgment and thought content normal.     Labs reviewed: Anti-coag visit on 01/13/2014  Component Date Value Ref Range Status  . INR 01/13/2014 1.7   Final   [facility]  Anti-coag visit on 12/22/2013  Component Date Value Ref Range Status  . INR 12/22/2013 2.0   Final   [facility]  Anti-coag visit on 12/08/2013  Component Date Value Ref Range Status  . INR 12/08/2013 1.5   Final   [facility]      Assessment/Plan  1. Preventive measure - DNR (Do Not Resuscitate)  2. Essential hypertension controlled  3. Mitral insufficiency uunchanged  4. Chronic anticoagulation For AF  5. Chronic atrial fibrillation Rate controlled  6. Gastroesophageal reflux disease without esophagitis asymptomatic  7. Hypothyroidism, unspecified hypothyroidism type compensated  8.  Hyperglycemia controlled

## 2014-07-09 IMAGING — CR DG CHEST 1V PORT
1 series · 1 of 1 positions shown · non-contrast
Comparison: 03/03/2010

CLINICAL DATA: Respiratory distress

PORTABLE CHEST - 1 VIEW

[AP]
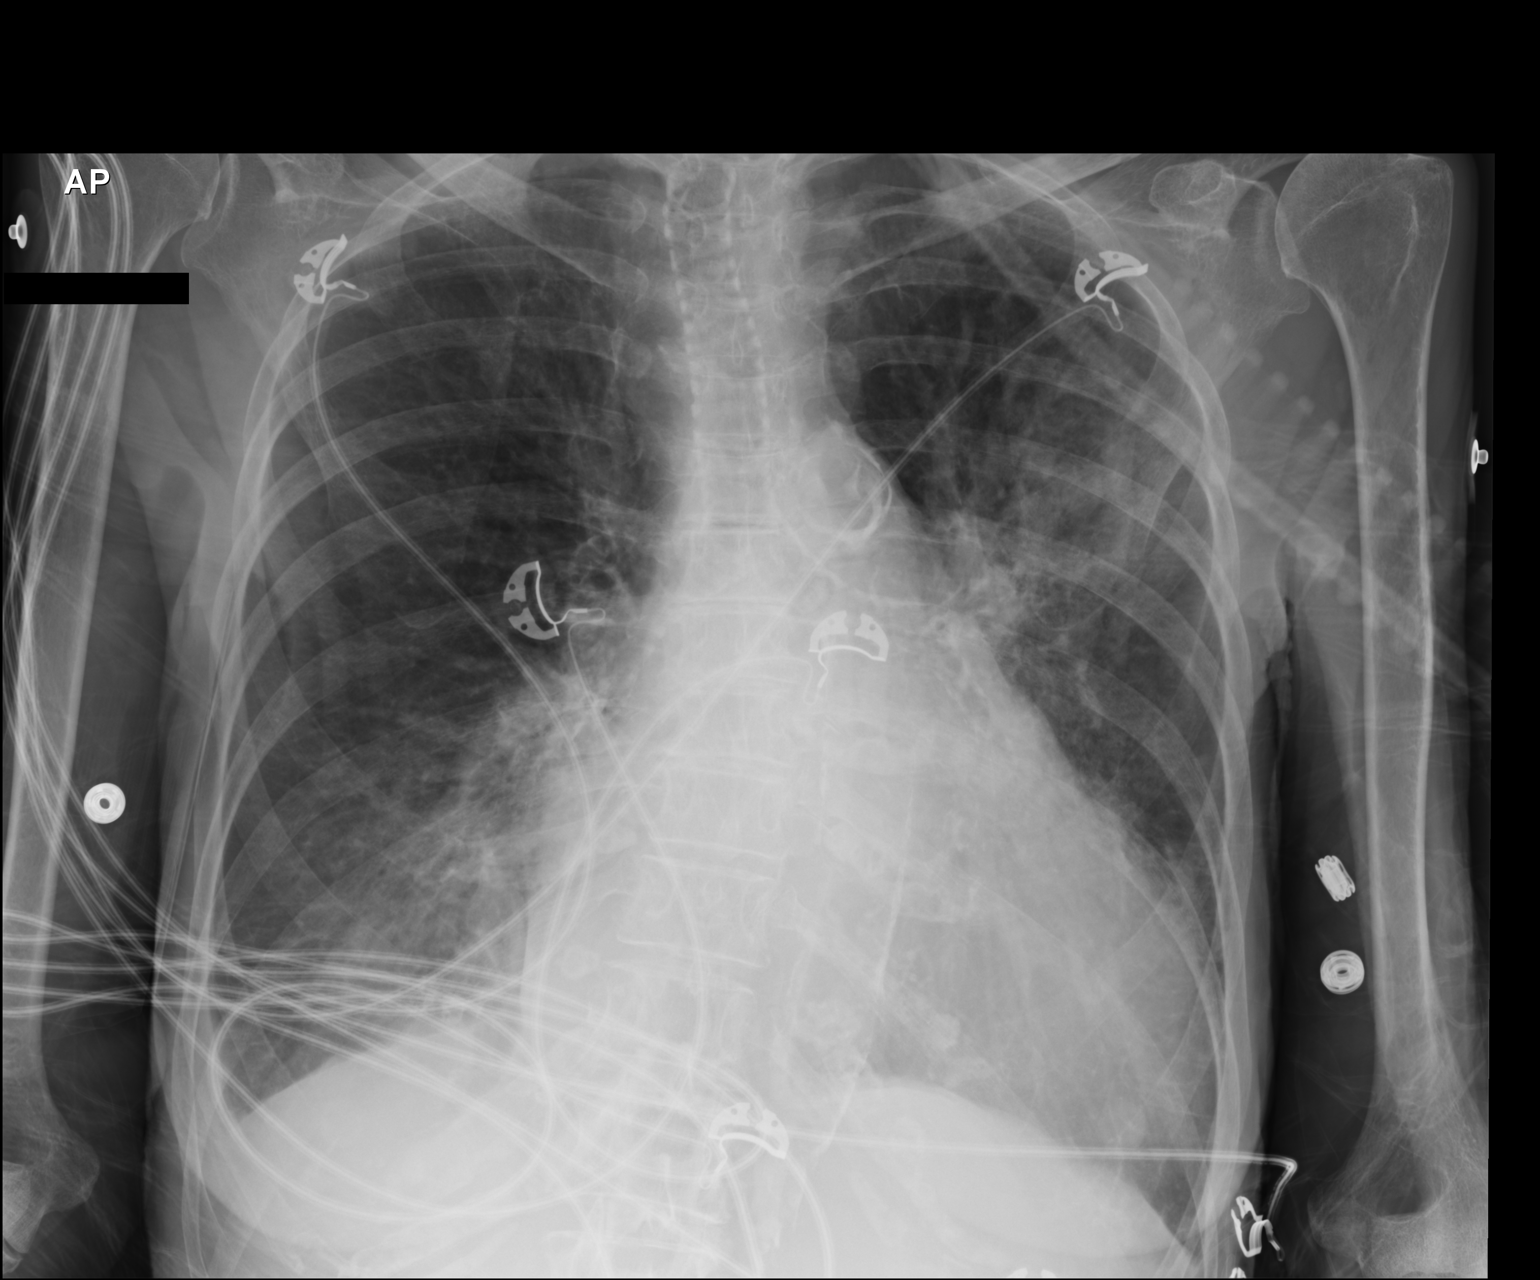

[1 of 1 positions shown; findings below may reference images not displayed]

FINDINGS: Hyperexpansion is consistent with emphysema.  Patchy
airspace disease is seen in the left upper lung and in the right
base, suggesting pneumonia. The cardiopericardial silhouette is
enlarged. Bones are diffusely demineralized. Telemetry leads
overlie the chest.
IMPRESSION: Cardiomegaly with patchy bilateral airspace disease suggesting
pneumonia.  Follow-up imaging is recommended to ensure resolution.

## 2014-07-10 IMAGING — CR DG CHEST 1V PORT
1 series · 1 of 1 positions shown · non-contrast
Comparison: 01/10/2013

CLINICAL DATA: Pneumonia, cough

PORTABLE CHEST - 1 VIEW

[AP]
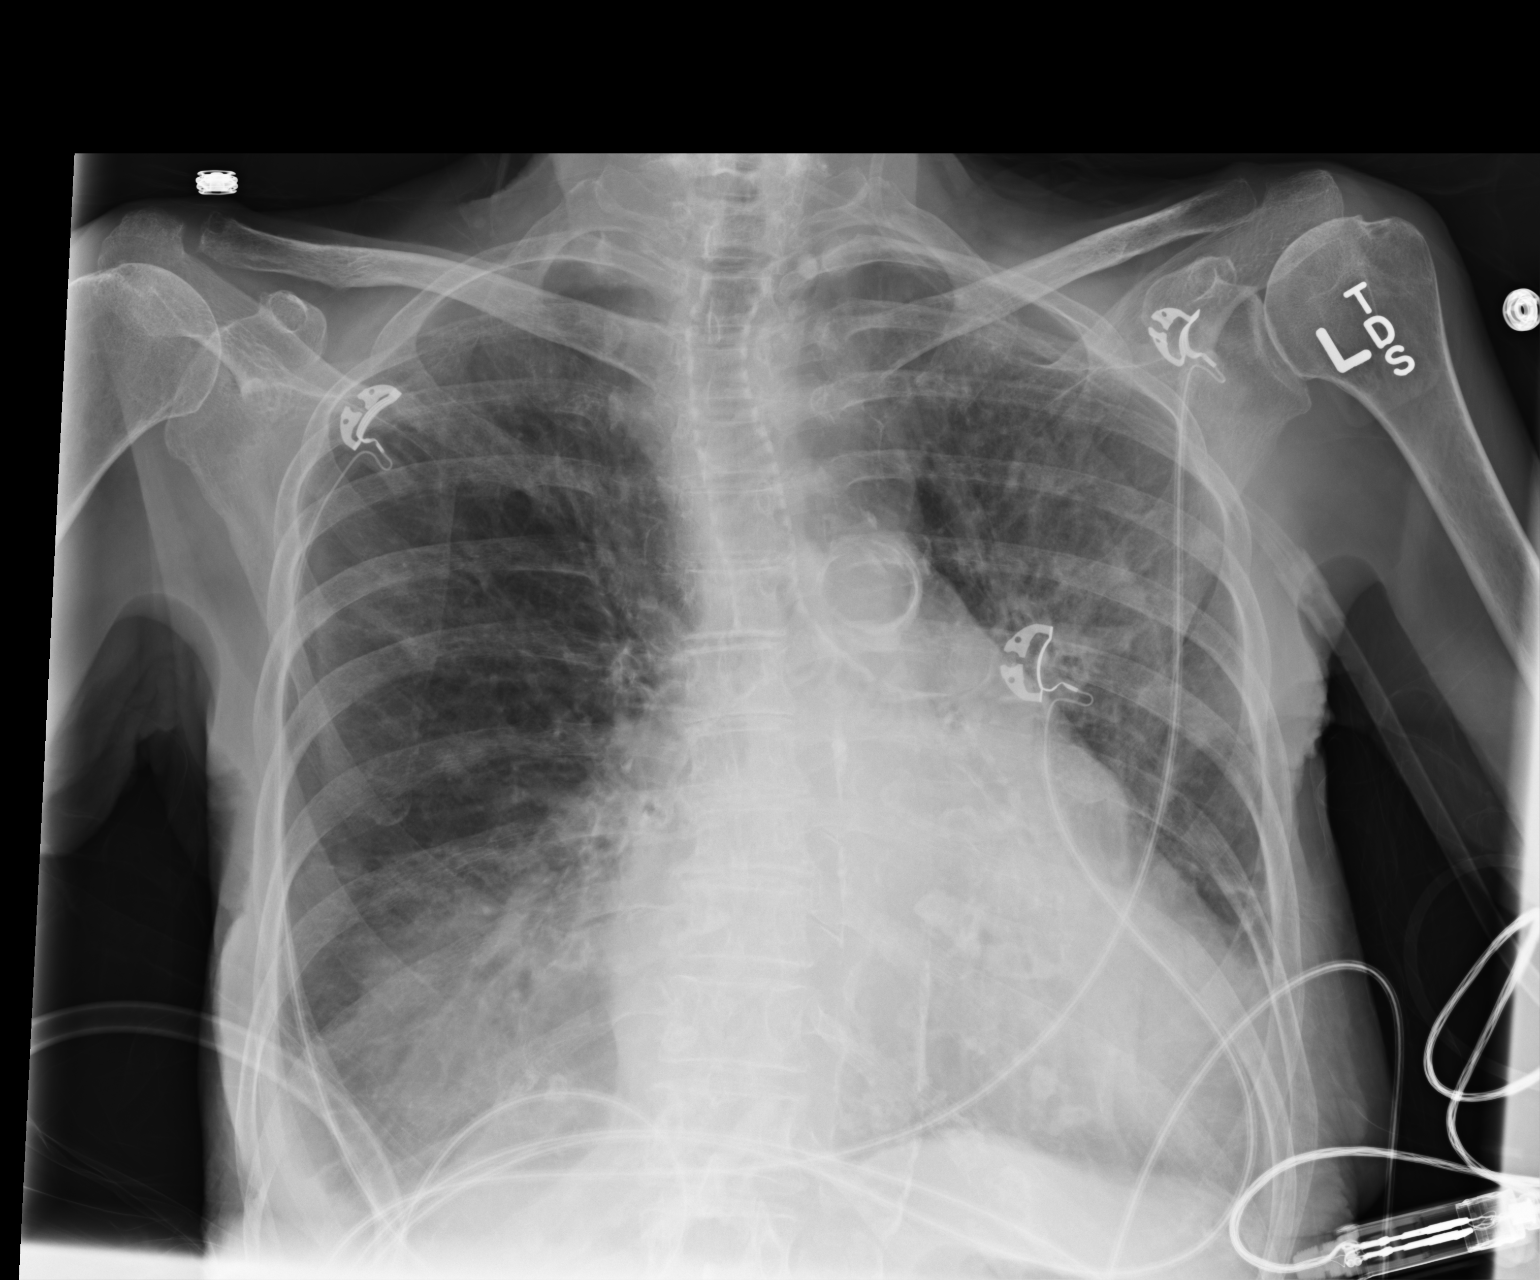

[1 of 1 positions shown; findings below may reference images not displayed]

FINDINGS: Worsened bibasilar airspace opacities noted with trace
pleural effusion.  Cardiomegaly persists.  Aortic calcification
again noted.  There is also ill-defined left greater than right
patchy upper lobe airspace opacity.
IMPRESSION: Worsening multilobar airspace opacities which could represent
pneumonia, although other alveolar filling processes such as
hemorrhage, asymmetric edema, or aspiration could have a similar
appearance.  Radiographic resolution of pneumonia [REDACTED]weeks.

## 2015-02-25 ENCOUNTER — Non-Acute Institutional Stay: Payer: Medicare Other | Admitting: Internal Medicine

## 2015-02-25 ENCOUNTER — Encounter: Payer: Self-pay | Admitting: Internal Medicine

## 2015-02-25 VITALS — BP 142/80 | HR 78 | Temp 97.4°F | Ht 61.0 in | Wt 98.0 lb

## 2015-02-25 DIAGNOSIS — R609 Edema, unspecified: Secondary | ICD-10-CM | POA: Diagnosis not present

## 2015-02-25 DIAGNOSIS — I5042 Chronic combined systolic (congestive) and diastolic (congestive) heart failure: Secondary | ICD-10-CM | POA: Diagnosis not present

## 2015-02-25 DIAGNOSIS — I482 Chronic atrial fibrillation, unspecified: Secondary | ICD-10-CM

## 2015-02-25 DIAGNOSIS — E039 Hypothyroidism, unspecified: Secondary | ICD-10-CM

## 2015-02-25 DIAGNOSIS — Z7901 Long term (current) use of anticoagulants: Secondary | ICD-10-CM

## 2015-02-25 DIAGNOSIS — R739 Hyperglycemia, unspecified: Secondary | ICD-10-CM

## 2015-02-25 DIAGNOSIS — I10 Essential (primary) hypertension: Secondary | ICD-10-CM | POA: Diagnosis not present

## 2015-02-25 MED ORDER — METOPROLOL TARTRATE 25 MG PO TABS: ORAL_TABLET | ORAL | Status: AC

## 2015-02-25 NOTE — Progress Notes (Signed)
Patient ID: Rose Weber, female   DOB: 1913-04-29, 81 y.o.   MRN: [id number]    HISTORY AND PHYSICAL  Location:  [facility] Room Number: [id number] Place of Service: Clinic (12)   Extended Emergency Contact Information Primary Emergency Contact: Galen D[place] Phone: [phone] Relation: Friend  Advanced Directive information Does patient have an advance directive?: Yes, Type of Advance Directive: Healthcare Power of [place];Living will;Out of facility DNR (pink MOST or yellow form), Pre-existing out of facility DNR order (yellow form or pink MOST form): Yellow form placed in chart (order not valid for inpatient use)  Chief Complaint  Patient presents with  . Annual Exam    Comprehensive exam: blood pressure, A-Fib, CHF, thyroid    HPI:  Chronic combined systolic and diastolic congestive heart failure denies significant shortness of breath. She is aware of her atrial fibrillation. Pedal edema has resolved. She is not currently on beta blocker.  Chronic anticoagulation: Therapeutic  Chronic atrial fibrillation: Anticoagulated  Edema: Resolved  Essential hypertension: Controlled  Hyperglycemia: Needs recheck  Hypothyroidism, unspecified hypothyroidism type: Needs recheck  Has noted some memory loss.    Past Medical History  Diagnosis Date  . HTN (hypertension)   . MVP (mitral valve prolapse)   . Mitral insufficiency     SEVERE  . Chronic anticoagulation   . Edema   . GERD (gastroesophageal reflux disease)   . Personal history of goiter   . Hypothyroidism   . CHF (congestive heart failure)   . Mitral valve prolapse     With severe mitral insufficiency  . Atrial fibrillation   . Goiter     History of goiter  . PNA (pneumonia)     strep  5/14  . Other malaise and fatigue 01/25/2012  . Visual disturbances 10/12/2010  . Benign neoplasm of colon 04/09/2003  . Cardiac dysrhythmia, unspecified 09/26/2000  . 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 Cardioversion  04/14/10  . Appendectomy  1932  . Small intestine surgery      SBO 2000  . Abdominal hysterectomy  1962  . Corneal transplant      MULTIPLE  . Rectal polypectomy    . Eye surgery Left 07/2013    replaced valve in eye    Patient Care Team: Estill Dooms, MD as PCP - General (Internal Medicine)  History   Social History  . Marital Status: Widowed    Spouse Name: N/A  . Number of Children: 1  . Years of Education: N/A   Occupational History  . 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 Smoking status: Never Smoker   . Smokeless tobacco: Never Used  . Alcohol Use: No  . Drug Use: No  . Sexual Activity: No   Other Topics Concern  . Not on file   Social History Narrative   Lives at [facility] with walker   Never smoked   Alcohol none   Exercise none due to poor vision   DNR, Living Will, POA     reports that she has never smoked. She has never used smokeless tobacco. She reports that she does not drink alcohol or use illicit drugs.  Family History  Problem Relation Age of Onset  . Hip fracture Sister   . Stroke Father   .  Alzheimer's disease Brother   . Stroke Sister    Family Status  Relation Status Death Age  . Sister Deceased     unknown  . Sister Deceased     fell   . Mother Deceased     natural causes  . Father Deceased     thrombosis  . Brother Deceased     alzheimers  . Son Alive   . Sister Deceased   . Brother Marketing executive History  Administered Date(s) Administered  . Influenza Whole 06/04/2013  . Pneumococcal Polysaccharide-23 08/21/2009    Allergies  Allergen Reactions  . Alphagan [Brimonidine Tartrate]   . Avelox [Moxifloxacin Hcl In Nacl]   . Benadryl [Diphenhydramine]   . Bystolic [Nebivolol Hcl]   . Diltiazem   . Diovan [Valsartan]   .  Erythromycin   . Other     PRESERVATIVES  . Penicillins   . Polymyxin B-Trimethoprim   . Restasis [Cyclosporine]   . Sulfa Drugs Cross Reactors   . Tekturna [Aliskiren Fumarate]   . Travatan Z   . Xalatan [Latanoprost]     Medications: Patient's Medications  New Prescriptions   No medications on file  Previous Medications   BACITRACIN ([employer] BACITRACIN) OINTMENT    Apply 1 application topically. Apply thin line to both eyes at bedtime   CALCIUM CITRATE (CITRACAL PO)    Take by mouth. 240-40-125 take 3 daily   CARBOXYMETHYLCELLULOSE (REFRESH PLUS) 0.5 % SOLN    Place 1 drop into both eyes as needed (dry eyes). Instill 5-6 drops a day in both eyes for dry eyes.   CYCLOSPORINE (RESTASIS) 0.05 % OPHTHALMIC EMULSION    Apply 1 drop to eye 3 (three) times daily.   ESTROGENS, CONJUGATED, (PREMARIN) 0.3 MG TABLET    Take 0.3 mg by mouth. Take daily for 21 days then do not take for 7 days.   FUROSEMIDE (LASIX) 20 MG TABLET    Take 3 tablets (60 mg total) by mouth 2 (two) times daily.   LOTENSIN 20 MG TABLET    TAKE 1 TABLET TWICE DAILY.   POTASSIUM CHLORIDE SA (K-DUR,KLOR-CON) 20 MEQ TABLET    Take 20 meq twice a day   PREDNISOLONE ACETATE (PRED FORTE) 1 % OPHTHALMIC SUSPENSION    Place 1 drop into both eyes. One drop both eyes three times daily   SYNTHROID 25 MCG TABLET    TAKE 1 TABLET ONCE DAILY FOR THYROID.   WARFARIN (COUMADIN) 5 MG TABLET    Take 5 mg by mouth. Take 5mg  T, Th, Frid, Sun; take 2.5mg  M, W, Sat  Modified Medications   No medications on file  Discontinued Medications   CALCIUM CARBONATE (OS-CAL) 600 MG TABS    Take 600 mg by mouth daily. Take 1 tablet daily.   CHOLECALCIFEROL (VITAMIN D) 2000 UNIT TABLET    Take 400 Units by mouth daily. Take 2 tablets daily for vitamin D supplement.   WARFARIN (COUMADIN) 2.5 MG TABLET    take 5mg  on Mon, Wed, Frid, Sat; 2.5mg  Sun, Tue, Thur    Review of Systems  Constitutional: Negative for fever, diaphoresis, activity change, appetite  change and fatigue.  HENT: Negative.   Eyes: Negative.   Respiratory: Negative.   Cardiovascular: Positive for palpitations and leg swelling.  Endocrine:       Hyperglycemia Hypothyroid. Goiter  Genitourinary: Negative.   Musculoskeletal: Negative.   Skin: Negative.   Allergic/Immunologic: Negative.   Neurological: Negative.   Hematological: Negative.  Psychiatric/Behavioral: Negative.     Filed Vitals:   02/25/15 1547  BP: 142/80  Pulse: 78  Temp: 97.4 F (36.3 C)  TempSrc: Oral  Height: 5\' 1"  (1.549 m)  Weight: 98 lb (44.453 kg)  SpO2: 99%   Body mass index is 18.53 kg/(m^2).  Physical Exam  Constitutional: She is oriented to person, place, and time. She appears well-developed and well-nourished. No distress.  HENT:  Head: Normocephalic and atraumatic.  Right Ear: External ear normal.  Left Ear: External ear normal.  Nose: Nose normal.  Mouth/Throat: Oropharynx is clear and moist. No oropharyngeal exudate.  Torus palatinus.   Eyes: Conjunctivae and EOM are normal. Pupils are equal, round, and reactive to light. Right eye exhibits no discharge. Left eye exhibits no discharge. No scleral icterus.  Corneal transplants. Bilateral ectropion of the lower lids.  Neck: Normal range of motion. Neck supple. No JVD present. No thyromegaly (goitor ) present.  Cardiovascular: Normal rate.  An irregular rhythm present.  Murmur heard.  Systolic murmur is present with a grade of 4/6  Systolic murmur 0-1/7. AF.  Pulmonary/Chest: Effort normal. No stridor. No respiratory distress. She has no wheezes. She has rales in the right lower field and the left lower field.  Abdominal: Soft. Bowel sounds are normal. She exhibits no distension. There is no tenderness. There is no rebound.  Musculoskeletal: Normal range of motion. She exhibits edema. She exhibits no tenderness.  Trace edema ankle.   Lymphadenopathy:    She has no cervical adenopathy.  Neurological: She is alert and oriented  to person, place, and time. She has normal reflexes. No cranial nerve deficit. She exhibits normal muscle tone. Coordination normal.  02/25/15 MMSE 28/30. Failed clock drawing.  Skin: Skin is warm and dry. No rash noted. She is not diaphoretic. No erythema.  Psychiatric: She has a normal mood and affect. Her behavior is normal. Judgment and thought content normal.     Labs reviewed: No visits with results within 3 Month(s) from this visit. Latest known visit with results is:  [facility] on 02/26/2014  Component Date Value Ref Range Status  . Hemoglobin 12/02/2013 13.1  12.0 - 16.0 g/dL Final  . HCT 12/02/2013 37  36 - 46 % Final  . Platelets 12/02/2013 251  150 - 399 K/L Final  . WBC 12/02/2013 6.8   Final  . Glucose 12/02/2013 101   Final  . BUN 12/02/2013 16  4 - 21 mg/dL Final  . Creatinine 12/02/2013 0.7  0.5 - 1.1 mg/dL Final  . Potassium 12/02/2013 3.5  3.4 - 5.3 mmol/L Final  . Sodium 12/02/2013 138  137 - 147 mmol/L Final  . Alkaline Phosphatase 12/02/2013 114  25 - 125 U/L Final  . ALT 12/02/2013 11  7 - 35 U/L Final  . AST 12/02/2013 19  13 - 35 U/L Final  . Bilirubin, Total 12/02/2013 1.2   Final  . TSH 12/02/2013 1.01  0.41 - 5.90 uIU/mL Final    No results found.   Assessment/Plan  1. Chronic combined systolic and diastolic congestive heart failure - Add metoprolol 25 mg twice a day  2. Chronic anticoagulation Continue current dose warfarin  3. Chronic atrial fibrillation Rate controlled and anticoagulated  4. Edema Resolved  5. Essential hypertension   6. Hyperglycemia -CMP  7. Hypothyroidism, unspecified hypothyroidism type -TSH

## 2015-02-25 NOTE — Progress Notes (Signed)
Failed clock drawing  

## 2015-02-25 NOTE — Addendum Note (Signed)
Addended by: Estill Dooms on: 02/25/2015 05:13 PM   Modules accepted: Level of Service

## 2015-02-26 ENCOUNTER — Telehealth: Payer: Self-pay

## 2015-02-26 NOTE — Telephone Encounter (Signed)
Received fax from [facility], patient listed with allergy to beta-blockers (allergic to bystolic) and metoprolol is in this class. Message to Dr. Nyoka Cowden to clarify

## 2015-03-01 NOTE — Telephone Encounter (Signed)
She does not appear to be allergic to [place] but has had a possible intolerance to that drug. Because of her congestive heart failure and atrial fibrillation, we should try to have her on a beta blocker. Metoprolol was ordered at a relatively low dose and I think she should begin taking this medication.

## 2015-03-02 LAB — BASIC METABOLIC PANEL
BUN: 17 mg/dL (ref 4–21)
CREATININE: 0.9 mg/dL (ref 0.5–1.1)
Glucose: 82 mg/dL
Potassium: 4.1 mmol/L (ref 3.4–5.3)
Sodium: 143 mmol/L (ref 137–147)

## 2015-03-02 LAB — HEPATIC FUNCTION PANEL
ALK PHOS: 86 U/L (ref 25–125)
ALT: 9 U/L (ref 7–35)
AST: 17 U/L (ref 13–35)
Bilirubin, Total: 0.9 mg/dL

## 2015-03-02 LAB — CBC AND DIFFERENTIAL
HEMATOCRIT: 40 % (ref 36–46)
HEMOGLOBIN: 13.3 g/dL (ref 12.0–16.0)
Platelets: 203 10*3/uL (ref 150–399)
WBC: 4.7 10*3/mL

## 2015-03-02 LAB — TSH: TSH: 0.18 u[IU]/mL — AB (ref 0.41–5.90)

## 2015-03-02 NOTE — Telephone Encounter (Signed)
Wrote Dr. Rolly Salter message on fax, and  fax back to [facility]

## 2015-03-04 ENCOUNTER — Non-Acute Institutional Stay: Payer: Medicare Other | Admitting: Nurse Practitioner

## 2015-03-04 DIAGNOSIS — I482 Chronic atrial fibrillation, unspecified: Secondary | ICD-10-CM

## 2015-03-04 DIAGNOSIS — I509 Heart failure, unspecified: Secondary | ICD-10-CM

## 2015-03-04 DIAGNOSIS — I10 Essential (primary) hypertension: Secondary | ICD-10-CM

## 2015-03-04 DIAGNOSIS — E039 Hypothyroidism, unspecified: Secondary | ICD-10-CM

## 2015-03-04 DIAGNOSIS — Z7901 Long term (current) use of anticoagulants: Secondary | ICD-10-CM

## 2015-03-17 ENCOUNTER — Encounter: Payer: Self-pay | Admitting: Nurse Practitioner

## 2015-03-17 NOTE — Assessment & Plan Note (Signed)
Compensated clinically, seen chronic trace ankle edema, continue to take Furosemide 60mg  bid, monitor the patient weight closely.

## 2015-03-17 NOTE — Assessment & Plan Note (Addendum)
Will reduce Levothyroxine to 56mcg qod due to low TSH 0.182, will repeat TSH in 12 weeks, observe the patient.

## 2015-03-17 NOTE — Assessment & Plan Note (Signed)
Blood pressure is controlled, takes Benazepril 20mg  bid and Lasix 60mg  bid.

## 2015-03-17 NOTE — Assessment & Plan Note (Signed)
Takes Coumadin for PT/EVT risk reduction.

## 2015-03-17 NOTE — Assessment & Plan Note (Signed)
Heart rate is in control, not taking rate control agent.

## 2015-03-17 NOTE — Progress Notes (Signed)
Patient ID: Rose Weber, female   DOB: 07-16-1913, 81 y.o.   MRN: [id number]  Location:  [facility] Provider:  Marlana Latus NP  Code Status:  DNR Goals of care: Advanced Directive information    Chief Complaint  Patient presents with  . Medical Management of Chronic Issues  . Acute Visit    thyroid     HPI: Patient is a 81 y.o. female seen in the [facility] at [facility] today for evaluation of thyroid. The patient has Hx of hypertension, blood pressure has been controlled on Benazepril 20mg  daily. CHF has been clinically compensated while taking Furosemide 60mg  daily, 03/02/15 Na 143, K 4.2, Bun 17, creatinine 0.92. Hx of Afib, heart rate has been controlled, takes Coumadin for PE/DVT risk reduction.   Problem List Items Addressed This Visit    HTN (hypertension) - Primary    Blood pressure is controlled, takes Benazepril 20mg  bid and Lasix 60mg  bid.        Chronic anticoagulation    Takes Coumadin for PT/EVT risk reduction.       Atrial fibrillation    Heart rate is in control, not taking rate control agent.       Hypothyroidism    Will reduce Levothyroxine to 27mcg qod due to low TSH 0.182, will repeat TSH in 12 weeks, observe the patient.       CHF (congestive heart failure)    Compensated clinically, seen chronic trace ankle edema, continue to take Furosemide 60mg  bid, monitor the patient weight closely.         Review of Systems:  Review of Systems  Constitutional: Negative for chills, weight loss and malaise/fatigue.  HENT: Positive for hearing loss. Negative for congestion.   Eyes: Negative.  Negative for blurred vision, discharge and redness.  Respiratory: Negative.  Negative for cough, sputum production, shortness of breath and wheezing.   Cardiovascular: Positive for palpitations and leg swelling. Negative for PND.       Trace edema in ankles.   Gastrointestinal: Negative for heartburn, abdominal pain and constipation.  Genitourinary: Positive for  frequency. Negative for dysuria and hematuria.  Musculoskeletal: Negative.  Negative for myalgias, back pain and neck pain.  Skin: Negative.  Negative for rash.  Neurological: Negative.  Negative for dizziness, tingling, tremors, seizures and headaches.  Endo/Heme/Allergies: Does not bruise/bleed easily.  Psychiatric/Behavioral: Negative.  Negative for depression, suicidal ideas and hallucinations. The patient is not nervous/anxious and does not have insomnia.     Past Medical History  Diagnosis Date  . HTN (hypertension)   . MVP (mitral valve prolapse)   . Mitral insufficiency     SEVERE  . Chronic anticoagulation   . Edema   . GERD (gastroesophageal reflux disease)   . Personal history of goiter   . Hypothyroidism   . CHF (congestive heart failure)   . Mitral valve prolapse     With severe mitral insufficiency  . Atrial fibrillation   . Goiter     History of goiter  . PNA (pneumonia)     strep  5/14  . Other malaise and fatigue 01/25/2012  . Visual disturbances 10/12/2010  . Benign neoplasm of colon 04/09/2003  . Cardiac dysrhythmia, unspecified 09/26/2000  . Other corneal degenerations 02/19/1986    Fuchs corneal dystrophy    Patient Active Problem List   Diagnosis Date Noted  . After-cataract obscuring vision 02/17/2014  . Band keratopathy 12/10/2013  . Blurred vision 12/10/2013  . Post menopausal syndrome 09/06/2013  .  Choroidal effusion 07/09/2013  . Personal history of other diseases of circulatory system 05/06/2013  . History of prolonged Q-T interval on ECG 05/06/2013  . Pseudoaphakia 04/01/2013  . Hyperglycemia 02/15/2013  . DNR (do not resuscitate) 01/16/2013  . 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 Atrophy, Fuchs' 05/15/2012  . Chronic glaucoma 05/14/2012  . Fatigue 11/09/2011  . CHF (congestive heart failure)   . HTN (hypertension)   . MVP (mitral valve prolapse)   . Mitral insufficiency   . Chronic anticoagulation   . Edema   . Atrial fibrillation    . GERD (gastroesophageal reflux disease)   . Personal history of goiter   . Hypothyroidism     Allergies  Allergen Reactions  . Alphagan [Brimonidine Tartrate]   . Avelox [Moxifloxacin Hcl In Nacl]   . Benadryl [Diphenhydramine]   . Bystolic [Nebivolol Hcl]   . Diltiazem   . Diovan [Valsartan]   . Erythromycin   . Other     PRESERVATIVES  . Penicillins   . Polymyxin B-Trimethoprim   . Restasis [Cyclosporine]   . Sulfa Drugs Cross Reactors   . Tekturna [Aliskiren Fumarate]   . Travatan Z   . Xalatan [Latanoprost]     Medications: Patient's Medications  New Prescriptions   No medications on file  Previous Medications   BACITRACIN ([employer] BACITRACIN) OINTMENT    Apply 1 application topically. Apply thin line to both eyes at bedtime   CALCIUM CITRATE (CITRACAL PO)    Take by mouth. 240-40-125 take 3 daily   CARBOXYMETHYLCELLULOSE (REFRESH PLUS) 0.5 % SOLN    Place 1 drop into both eyes as needed (dry eyes). Instill 5-6 drops a day in both eyes for dry eyes.   CYCLOSPORINE (RESTASIS) 0.05 % OPHTHALMIC EMULSION    Apply 1 drop to eye 3 (three) times daily.   ESTROGENS, CONJUGATED, (PREMARIN) 0.3 MG TABLET    Take 0.3 mg by mouth. Take daily for 21 days then do not take for 7 days.   FUROSEMIDE (LASIX) 20 MG TABLET    Take 3 tablets (60 mg total) by mouth 2 (two) times daily.   LOTENSIN 20 MG TABLET    TAKE 1 TABLET TWICE DAILY.   METOPROLOL TARTRATE (LOPRESSOR) 25 MG TABLET    One twice daily to control blood pressure   POTASSIUM CHLORIDE SA (K-DUR,KLOR-CON) 20 MEQ TABLET    Take 20 meq twice a day   PREDNISOLONE ACETATE (PRED FORTE) 1 % OPHTHALMIC SUSPENSION    Place 1 drop into both eyes. One drop both eyes three times daily   SYNTHROID 25 MCG TABLET    TAKE 1 TABLET ONCE DAILY FOR THYROID.   WARFARIN (COUMADIN) 5 MG TABLET    Take 5 mg by mouth. Take 5mg  T, Th, Frid, Sun; take 2.5mg  M, W, Sat  Modified Medications   No medications on file  Discontinued Medications   No  medications on file    Physical Exam: Filed Vitals:   03/04/15 0900  BP: 138/70  Pulse: 60  Temp: 98.2 F (36.8 C)  TempSrc: Tympanic  Resp: 18   There is no weight on file to calculate BMI.  Physical Exam  Constitutional: She is oriented to person, place, and time. She appears well-developed and well-nourished. No distress.  HENT:  Head: Normocephalic and atraumatic.  Right Ear: External ear normal.  Left Ear: External ear normal.  Mouth/Throat: No oropharyngeal exudate.  Torus palatinus.   Eyes: Conjunctivae are normal. Pupils are equal, round, and reactive  to light. No scleral icterus.  Corneal transplants. Bilateral ectropion of the lower lids.  Neck: Normal range of motion. Neck supple. No JVD present. No thyromegaly (goitor ) present.  Cardiovascular: Normal rate.  An irregular rhythm present.  Murmur heard.  Systolic murmur is present with a grade of 4/6  Systolic murmur 8-5/4. AF.  Pulmonary/Chest: Effort normal. No stridor. No respiratory distress. She has no wheezes. She has no rales.  Abdominal: Soft. Bowel sounds are normal. There is no tenderness.  Musculoskeletal: Normal range of motion. She exhibits edema. She exhibits no tenderness.  Trace edema ankle.   Lymphadenopathy:    She has no cervical adenopathy.  Neurological: She is alert and oriented to person, place, and time. She has normal reflexes. No cranial nerve deficit. She exhibits normal muscle tone. Coordination normal.  02/25/15 MMSE 28/30. Failed clock drawing.  Skin: Skin is warm and dry. No rash noted. She is not diaphoretic. No pallor.  Psychiatric: She has a normal mood and affect. Her behavior is normal. Judgment and thought content normal.    Labs reviewed: Basic Metabolic Panel:  Recent Labs  03/02/15  NA 143  K 4.1  BUN 17  CREATININE 0.9    Liver Function Tests:  Recent Labs  03/02/15  AST 17  ALT 9  ALKPHOS 86    CBC:  Recent Labs  03/02/15  WBC 4.7  HGB 13.3  HCT 40   PLT 203    Lab Results  Component Value Date   TSH 0.18* 03/02/2015   No results found for: HGBA1C Lab Results  Component Value Date   CHOL 168 08/10/2011   HDL 77.70 08/10/2011   LDLCALC 81 08/10/2011   TRIG 46.0 08/10/2011   CHOLHDL 2 08/10/2011    Significant Diagnostic Results since last visit: none  Patient Care Team: Estill Dooms, MD as PCP - General (Internal Medicine) Peter M Martinique, MD as Consulting Physician (Cardiology) Ronald Lobo, MD as Consulting Physician (Gastroenterology)  Assessment/Plan There are no diagnoses linked to this encounter.   Family/ staff Communication: decreased Levothyroxine and f/u TSH scheduled.   Labs/tests ordered: TSH 12 weeks.   [facility] Mast NP Geriatrics [facility] [phone] [place], [place] [address] On Call:  [phone] & follow prompts after 5pm & weekends Office Phone:  [phone] Office Fax:  [phone]

## 2015-06-08 LAB — TSH: TSH: 1.04 u[IU]/mL (ref 0.41–5.90)

## 2015-06-10 ENCOUNTER — Other Ambulatory Visit: Payer: Self-pay | Admitting: Nurse Practitioner

## 2015-06-10 DIAGNOSIS — E039 Hypothyroidism, unspecified: Secondary | ICD-10-CM

## 2015-08-26 ENCOUNTER — Encounter: Payer: Self-pay | Admitting: Internal Medicine

## 2015-09-20 ENCOUNTER — Encounter: Payer: Self-pay | Admitting: Nurse Practitioner

## 2015-09-20 ENCOUNTER — Non-Acute Institutional Stay: Payer: Medicare Other | Admitting: Nurse Practitioner

## 2015-09-20 DIAGNOSIS — E039 Hypothyroidism, unspecified: Secondary | ICD-10-CM

## 2015-09-20 DIAGNOSIS — Z780 Asymptomatic menopausal state: Secondary | ICD-10-CM | POA: Diagnosis not present

## 2015-09-20 DIAGNOSIS — Z7901 Long term (current) use of anticoagulants: Secondary | ICD-10-CM | POA: Diagnosis not present

## 2015-09-20 DIAGNOSIS — I4821 Permanent atrial fibrillation: Secondary | ICD-10-CM

## 2015-09-20 DIAGNOSIS — I482 Chronic atrial fibrillation: Secondary | ICD-10-CM

## 2015-09-20 DIAGNOSIS — I10 Essential (primary) hypertension: Secondary | ICD-10-CM | POA: Diagnosis not present

## 2015-09-20 DIAGNOSIS — I5040 Unspecified combined systolic (congestive) and diastolic (congestive) heart failure: Secondary | ICD-10-CM

## 2015-09-20 DIAGNOSIS — N951 Menopausal and female climacteric states: Secondary | ICD-10-CM

## 2015-09-20 NOTE — Assessment & Plan Note (Signed)
Takes Premarin 0.3mg  daily for 20 days then off for 10 days. The patient is 82 years of age and failed alternative in the past per the patient. Medically necessary to continue the same therapy.

## 2015-09-20 NOTE — Assessment & Plan Note (Signed)
Takes Coumadin for PT/EVT risk reduction.  

## 2015-09-20 NOTE — Assessment & Plan Note (Signed)
Compensated clinically, seen chronic trace ankle edema, continue Furosemide, monitor the patient weight closely.

## 2015-09-20 NOTE — Assessment & Plan Note (Signed)
Continue Levothyroxine to 61mcg qod due to low TSH 0.182, last 06/08/15 TSH 1.042

## 2015-09-20 NOTE — Assessment & Plan Note (Signed)
Heart rate is in control, continue Metoprolol  

## 2015-09-20 NOTE — Assessment & Plan Note (Signed)
Blood pressure is controlled, takes Benazepril, Furosemide, Metoprolol.

## 2015-09-20 NOTE — Progress Notes (Signed)
Patient ID: Rose Weber, female   DOB: 12-29-1912, 82 y.o.   MRN: [id number]

## 2015-09-20 NOTE — Progress Notes (Signed)
Patient ID: Rose Weber, female   DOB: Jan 06, 1913, 82 y.o.   MRN: [id number]  [facility]  [facility] Provider:  Marlana Latus NP  Code Status:  DNR Goals of care: Advanced Directive information Does patient have an advance directive?: Yes, Type of Advance Directive: Out of facility DNR (pink MOST or yellow form);Healthcare Power of Geographical information systems officer Complaint  Patient presents with  . Medical Management of Chronic Issues    Routine Visit     HPI: Patient is a 82 y.o. female seen in the AL at [facility] today for evaluation of Hx of thyroid, edema, HTN, chronic anticoagulation therapy.   Review of Systems:  Review of Systems  Constitutional: Negative for chills, weight loss and malaise/fatigue.  HENT: Positive for hearing loss. Negative for congestion.   Eyes: Negative.  Negative for blurred vision, discharge and redness.  Respiratory: Negative.  Negative for cough, sputum production, shortness of breath and wheezing.   Cardiovascular: Positive for leg swelling. Negative for palpitations and PND.       Trace edema in ankles.   Gastrointestinal: Negative for heartburn, abdominal pain and constipation.  Genitourinary: Positive for frequency. Negative for dysuria and hematuria.  Musculoskeletal: Negative.  Negative for myalgias, back pain and neck pain.  Skin: Negative.  Negative for rash.  Neurological: Negative.  Negative for dizziness, tingling, tremors, seizures and headaches.  Endo/Heme/Allergies: Does not bruise/bleed easily.  Psychiatric/Behavioral: Negative.  Negative for depression, suicidal ideas and hallucinations. The patient is not nervous/anxious and does not have insomnia.     Past Medical History  Diagnosis Date  . HTN (hypertension)   . MVP (mitral valve prolapse)   . Mitral insufficiency     SEVERE  . Chronic anticoagulation   . Edema   . GERD (gastroesophageal reflux disease)   . Personal history of goiter   . Hypothyroidism   . CHF (congestive heart  failure) ([place])   . Mitral valve prolapse     With severe mitral insufficiency  . Atrial fibrillation ([place])   . Goiter     History of goiter  . PNA (pneumonia)     strep  5/14  . Other malaise and fatigue 01/25/2012  . Visual disturbances 10/12/2010  . Benign neoplasm of colon 04/09/2003  . Cardiac dysrhythmia, unspecified 09/26/2000  . Other corneal degenerations 02/19/1986    Fuchs corneal dystrophy    Patient Active Problem List   Diagnosis Date Noted  . After-cataract obscuring vision 02/17/2014  . Band keratopathy 12/10/2013  . Blurred vision 12/10/2013  . Post menopausal syndrome 09/06/2013  . Choroidal effusion 07/09/2013  . Personal history of other diseases of circulatory system 05/06/2013  . History of prolonged Q-T interval on ECG 05/06/2013  . Pseudoaphakia 04/01/2013  . Hyperglycemia 02/15/2013  . DNR (do not resuscitate) 01/16/2013  . 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 Atrophy, Fuchs' 05/15/2012  . Chronic glaucoma 05/14/2012  . Fatigue 11/09/2011  . CHF (congestive heart failure) ([place])   . HTN (hypertension)   . MVP (mitral valve prolapse)   . Mitral insufficiency   . Chronic anticoagulation   . Edema   . Atrial fibrillation ([place])   . GERD (gastroesophageal reflux disease)   . Personal history of goiter   . Hypothyroidism     Allergies  Allergen Reactions  . Alphagan [Brimonidine Tartrate]   . Avelox [Moxifloxacin Hcl In Nacl]   . Benadryl [Diphenhydramine]   . Bystolic [Nebivolol Hcl]   . Combigan [Brimonidine Tartrate-Timolol]   .  Diltiazem   . Diovan [Valsartan]   . Erythromycin   . Other     PRESERVATIVES  . Penicillins   . Polymyxin B-Trimethoprim   . Restasis [Cyclosporine]   . Sulfa Drugs Cross Reactors   . Tekturna [Aliskiren Fumarate]   . Travatan Z   . Xalatan [Latanoprost]     Medications: Patient's Medications  New Prescriptions   No medications on file  Previous Medications   BACITRACIN ([employer] BACITRACIN) OINTMENT     Apply 1 application topically. Apply thin line to both eyes at bedtime   CALCIUM CITRATE (CITRACAL PO)    Take by mouth. 240-40-125 take 3 daily   CARBOXYMETHYLCELLULOSE (REFRESH PLUS) 0.5 % SOLN    Instill 1 drop into both eyes four times daily   CYCLOPENTOLATE-PHENYLEPHRINE (CYCLOMYDRYL) 0.2-1 % OPHTHALMIC SOLUTION    Place 1 drop into both eyes 2 (two) times daily.   CYCLOSPORINE (RESTASIS) 0.05 % OPHTHALMIC EMULSION    Apply 1 drop to eye 3 (three) times daily.   ESTROGENS, CONJUGATED, (PREMARIN) 0.3 MG TABLET    Take 0.3 mg by mouth. Take daily for 21 days then skip 7 days. Then repeat cycle   FUROSEMIDE (LASIX) 20 MG TABLET    Take 3 tablets (60 mg total) by mouth 2 (two) times daily.   LEVOTHYROXINE (SYNTHROID, LEVOTHROID) 25 MCG TABLET    Take one tablet every other day * pulse weekly on Wednesday*   LOTENSIN 20 MG TABLET    TAKE 1 TABLET TWICE DAILY.   METOPROLOL TARTRATE (LOPRESSOR) 25 MG TABLET    One twice daily to control blood pressure   MULTIPLE MINERALS-VITAMINS (CITRACAL PLUS) TABS    Take by mouth. Take 3 tablets by mouth daily   POLYVINYL ALCOHOL (LIQUIFILM TEARS) 1.4 % OPHTHALMIC SOLUTION    Place 1 drop into both eyes QID.   POTASSIUM CHLORIDE SA (K-DUR,KLOR-CON) 20 MEQ TABLET    Take 20 meq twice a day   PREDNISOLONE ACETATE (PRED FORTE) 1 % OPHTHALMIC SUSPENSION    Place 1 drop into both eyes. One drop both eyes 2 times daily   WARFARIN (COUMADIN) 3 MG TABLET    Take 1 tablet by mouth on Saturday and Sunday   WARFARIN (COUMADIN) 4 MG TABLET    4 mg. One tablet by mouth daily Monday - Friday  Modified Medications   No medications on file  Discontinued Medications   SYNTHROID 25 MCG TABLET    TAKE 1 TABLET ONCE DAILY FOR THYROID.   WARFARIN (COUMADIN) 5 MG TABLET    Take 5 mg by mouth. Take 5mg  T, Th, Frid, Sun; take 2.5mg  M, W, Sat    Physical Exam: There were no vitals filed for this visit. There is no weight on file to calculate BMI.  Physical Exam  Constitutional:  She is oriented to person, place, and time. She appears well-developed and well-nourished. No distress.  HENT:  Head: Normocephalic and atraumatic.  Right Ear: External ear normal.  Left Ear: External ear normal.  Mouth/Throat: No oropharyngeal exudate.  Torus palatinus.   Eyes: Conjunctivae are normal. Pupils are equal, round, and reactive to light. No scleral icterus.  Corneal transplants. Bilateral ectropion of the lower lids.  Neck: Normal range of motion. Neck supple. No JVD present. No thyromegaly (goitor ) present.  Cardiovascular: Normal rate.  An irregular rhythm present.  Murmur heard.  Systolic murmur is present with a grade of 4/6  Systolic murmur 123456. AF.  Pulmonary/Chest: Effort normal. No stridor. No respiratory  distress. She has no wheezes. She has no rales.  Abdominal: Soft. Bowel sounds are normal. There is no tenderness.  Musculoskeletal: Normal range of motion. She exhibits edema. She exhibits no tenderness.  Trace edema ankle.   Lymphadenopathy:    She has no cervical adenopathy.  Neurological: She is alert and oriented to person, place, and time. She has normal reflexes. No cranial nerve deficit. She exhibits normal muscle tone. Coordination normal.  02/25/15 MMSE 28/30. Failed clock drawing.  Skin: Skin is warm and dry. No rash noted. She is not diaphoretic. No pallor.  Psychiatric: She has a normal mood and affect. Her behavior is normal. Judgment and thought content normal.    Labs reviewed: Basic Metabolic Panel:  Recent Labs  03/02/15  NA 143  K 4.1  BUN 17  CREATININE 0.9    Liver Function Tests:  Recent Labs  03/02/15  AST 17  ALT 9  ALKPHOS 86    CBC:  Recent Labs  03/02/15  WBC 4.7  HGB 13.3  HCT 40  PLT 203    Lab Results  Component Value Date   TSH 1.04 06/08/2015   No results found for: HGBA1C Lab Results  Component Value Date   CHOL 168 08/10/2011   HDL 77.70 08/10/2011   LDLCALC 81 08/10/2011   TRIG 46.0 08/10/2011    CHOLHDL 2 08/10/2011    Significant Diagnostic Results since last visit: none  Patient Care Team: Estill Dooms, MD as PCP - General (Internal Medicine) Peter M Martinique, MD as Consulting Physician (Cardiology) Ronald Lobo, MD as Consulting Physician (Gastroenterology)  Assessment/Plan Problem List Items Addressed This Visit    Post menopausal syndrome    Takes Premarin 0.3mg  daily for 20 days then off for 10 days. The patient is 82 years of age and failed alternative in the past per the patient. Medically necessary to continue the same therapy.       Hypothyroidism - Primary    Continue Levothyroxine to 42mcg qod due to low TSH 0.182, last 06/08/15 TSH 1.042      Relevant Medications   levothyroxine (SYNTHROID, LEVOTHROID) 25 MCG tablet   HTN (hypertension)    Blood pressure is controlled, takes Benazepril, Furosemide, Metoprolol.       Relevant Medications   warfarin (COUMADIN) 4 MG tablet   warfarin (COUMADIN) 3 MG tablet   Chronic anticoagulation    Takes Coumadin for PT/EVT risk reduction.       CHF (congestive heart failure) ([place])    Compensated clinically, seen chronic trace ankle edema, continue Furosemide, monitor the patient weight closely.       Relevant Medications   warfarin (COUMADIN) 4 MG tablet   warfarin (COUMADIN) 3 MG tablet   Atrial fibrillation (H[facility])    Heart rate is in control, continue Metoprolol.       Relevant Medications   warfarin (COUMADIN) 4 MG tablet   warfarin (COUMADIN) 3 MG tablet       Family/ staff Communication: continue AL for care needs.   Labs/tests ordered:  none  Rose Aloysious Vangieson NP Geriatrics [facility] [address], [place] [address] On Call:  [phone] & follow prompts after 5pm & weekends Office Phone:  [phone] Office Fax:  [phone]

## 2015-10-28 ENCOUNTER — Non-Acute Institutional Stay: Payer: Medicare Other | Admitting: Nurse Practitioner

## 2015-10-28 ENCOUNTER — Encounter: Payer: Self-pay | Admitting: Nurse Practitioner

## 2015-10-28 DIAGNOSIS — E039 Hypothyroidism, unspecified: Secondary | ICD-10-CM | POA: Diagnosis not present

## 2015-10-28 DIAGNOSIS — I5040 Unspecified combined systolic (congestive) and diastolic (congestive) heart failure: Secondary | ICD-10-CM | POA: Diagnosis not present

## 2015-10-28 DIAGNOSIS — I10 Essential (primary) hypertension: Secondary | ICD-10-CM | POA: Diagnosis not present

## 2015-10-28 DIAGNOSIS — Z780 Asymptomatic menopausal state: Secondary | ICD-10-CM | POA: Diagnosis not present

## 2015-10-28 DIAGNOSIS — N951 Menopausal and female climacteric states: Secondary | ICD-10-CM

## 2015-10-28 DIAGNOSIS — I480 Paroxysmal atrial fibrillation: Secondary | ICD-10-CM

## 2015-10-28 DIAGNOSIS — Z7901 Long term (current) use of anticoagulants: Secondary | ICD-10-CM

## 2015-10-28 NOTE — Progress Notes (Signed)
Patient ID: Rose Weber, female   DOB: Dec 31, 1912, 82 y.o.   MRN: [id number]  Location:  [facility] Room Number: [id number] Place of Service: [facility] Provider:  Lennie Odor Phaedra Colgate NP  Estill Dooms, MD  Patient Care Team: Estill Dooms, MD as PCP - General (Internal Medicine) Peter M Martinique, MD as Consulting Physician (Cardiology) Ronald Lobo, MD as Consulting Physician (Gastroenterology)  Extended Emergency Contact Information Primary Emergency Contact: Galen Daft [place] Phone: [phone] Relation: Friend  Code Status:  DNR Goals of care: Advanced Directive information Advanced Directives 10/28/2015  Does patient have an advance directive? Yes  Type of Paramedic of [place];Out of facility DNR (pink MOST or yellow form)  Does patient want to make changes to advanced directive? No - Patient declined  Copy of advanced directive(s) in chart? Yes     Chief Complaint  Patient presents with  . Medical Management of Chronic Issues    Routine Visit    HPI:  Pt is a 82 y.o. female seen today for medical management of chronic diseases.  Hx of thyroid, taking Levothyroxine 28mcg, last TSH 1.04 06/08/15, edema, trace ankles, Furosemide 20mg , Kcl 21meq , HTN, controlled while Lotensin 20mg , Metoprolol 25mg  bid, chronic anticoagulation therapy on Coumadin, taking Premarin for post menopausal symptoms.     Past Medical History  Diagnosis Date  . HTN (hypertension)   . MVP (mitral valve prolapse)   . Mitral insufficiency     SEVERE  . Chronic anticoagulation   . Edema   . GERD (gastroesophageal reflux disease)   . Personal history of goiter   . Hypothyroidism   . CHF (congestive heart failure) ([place])   . Mitral valve prolapse     With severe mitral insufficiency  . Atrial fibrillation ([place])   . Goiter     History of goiter  . PNA (pneumonia)     strep  5/14  . Other malaise and fatigue 01/25/2012  .  Visual disturbances 10/12/2010  . Benign neoplasm of colon 04/09/2003  . Cardiac dysrhythmia, unspecified 09/26/2000  . Other corneal degenerations 02/19/1986    Fuchs corneal dystrophy   Past Surgical History  Procedure Laterality Date  . Cardioversion  04/14/10  . Appendectomy  1932  . Small intestine surgery      SBO 2000  . Abdominal hysterectomy  1962  . Corneal transplant      MULTIPLE  . Rectal polypectomy    . Eye surgery Left 07/2013    replaced valve in eye    Allergies  Allergen Reactions  . Alphagan [Brimonidine Tartrate]   . Avelox [Moxifloxacin Hcl In Nacl]   . Benadryl [Diphenhydramine]   . Bystolic [Nebivolol Hcl]   . Combigan [Brimonidine Tartrate-Timolol]   . Diltiazem   . Diovan [Valsartan]   . Erythromycin   . Other     PRESERVATIVES  . Penicillins   . Polymyxin B-Trimethoprim   . Restasis [Cyclosporine]   . Sulfa Drugs Cross Reactors   . Tekturna [Aliskiren Fumarate]   . Travatan Z   . Xalatan [Latanoprost]       Medication List       This list is accurate as of: 10/28/15  6:20 PM.  Always use your most recent med list.               carboxymethylcellulose 0.5 % Soln  Commonly known as:  REFRESH PLUS  Instill 1 drop into both eyes four  times daily     CITRACAL PO  Take by mouth. 240-40-125 take 3 daily     [employer] BACITRACIN ointment  Generic drug:  bacitracin  Apply 1 application topically. Apply thin line to both eyes at bedtime     cycloSPORINE 0.05 % ophthalmic emulsion  Commonly known as:  RESTASIS  Apply 1 drop to eye 3 (three) times daily.     estrogens (conjugated) 0.3 MG tablet  Commonly known as:  PREMARIN  Take 0.3 mg by mouth. Take daily for 21 days then skip 7 days. Then repeat cycle     furosemide 20 MG tablet  Commonly known as:  LASIX  Take 3 tablets (60 mg total) by mouth 2 (two) times daily.     levothyroxine 25 MCG tablet  Commonly known as:  SYNTHROID, LEVOTHROID  Take one tablet every other day * pulse weekly on  Wednesday*     LOTENSIN 20 MG tablet  Generic drug:  benazepril  TAKE 1 TABLET TWICE DAILY.     metoprolol tartrate 25 MG tablet  Commonly known as:  LOPRESSOR  One twice daily to control blood pressure     polyvinyl alcohol 1.4 % ophthalmic solution  Commonly known as:  LIQUIFILM TEARS  Place 1 drop into both eyes QID.     potassium chloride SA 20 MEQ tablet  Commonly known as:  K-DUR,KLOR-CON  Take 20 meq twice a day     prednisoLONE acetate 1 % ophthalmic suspension  Commonly known as:  PRED FORTE  Place 1 drop into both eyes. One drop both eyes 2 times daily     warfarin 4 MG tablet  Commonly known as:  COUMADIN  4 mg. One tablet by mouth daily except Monday and Wednesday     warfarin 3 MG tablet  Commonly known as:  COUMADIN  Take 1 tablet by mouth on Monday and Wednesday        Review of Systems  Constitutional: Negative for chills.  HENT: Positive for hearing loss. Negative for congestion.   Eyes: Negative.  Negative for discharge and redness.  Respiratory: Negative.  Negative for cough, shortness of breath and wheezing.   Cardiovascular: Positive for leg swelling. Negative for palpitations.       Trace edema in ankles.   Gastrointestinal: Negative for abdominal pain and constipation.  Genitourinary: Positive for frequency. Negative for dysuria and hematuria.  Musculoskeletal: Negative.  Negative for myalgias, back pain and neck pain.  Skin: Negative.  Negative for rash.  Neurological: Negative.  Negative for dizziness, tremors, seizures and headaches.  Hematological: Does not bruise/bleed easily.  Psychiatric/Behavioral: Negative.  Negative for suicidal ideas and hallucinations. The patient is not nervous/anxious.     Immunization History  Administered Date(s) Administered  . Influenza Whole 06/04/2013  . PPD Test 01/16/2014  . Pneumococcal Polysaccharide-23 08/21/2009   Pertinent  Health Maintenance Due  Topic Date Due  . DEXA SCAN  09/16/1979  . PNA  vac Low Risk Adult (2 of 2 - PCV13) 08/21/2010  . INFLUENZA VACCINE  07/24/2016 (Originally 03/22/2015)   Fall Risk  02/25/2015  Falls in the past year? No   Functional Status Survey:    Filed Vitals:   10/28/15 1016  BP: 155/82  Pulse: 51  Temp: 97.7 F (36.5 C)  TempSrc: Oral  Resp: 18  Height: 5\' 1"  (1.549 m)  Weight: 98 lb 9.6 oz (44.725 kg)   Body mass index is 18.64 kg/(m^2). Physical Exam  Constitutional: She is oriented to  person, place, and time. She appears well-developed and well-nourished. No distress.  HENT:  Head: Normocephalic and atraumatic.  Right Ear: External ear normal.  Left Ear: External ear normal.  Mouth/Throat: No oropharyngeal exudate.  Torus palatinus.   Eyes: Conjunctivae are normal. Pupils are equal, round, and reactive to light. No scleral icterus.  Corneal transplants. Bilateral ectropion of the lower lids.  Neck: Normal range of motion. Neck supple. No JVD present. No thyromegaly (goitor ) present.  Cardiovascular: Normal rate.  An irregular rhythm present.  Murmur heard.  Systolic murmur is present with a grade of 4/6  Systolic murmur 123456. AF.  Pulmonary/Chest: Effort normal. No stridor. No respiratory distress. She has no wheezes. She has no rales.  Abdominal: Soft. Bowel sounds are normal. There is no tenderness.  Musculoskeletal: Normal range of motion. She exhibits edema. She exhibits no tenderness.  Trace edema ankle.   Lymphadenopathy:    She has no cervical adenopathy.  Neurological: She is alert and oriented to person, place, and time. She has normal reflexes. No cranial nerve deficit. She exhibits normal muscle tone. Coordination normal.  02/25/15 MMSE 28/30. Failed clock drawing.  Skin: Skin is warm and dry. No rash noted. She is not diaphoretic. No pallor.  Psychiatric: She has a normal mood and affect. Her behavior is normal. Judgment and thought content normal.    Labs reviewed:  Recent Labs  03/02/15  NA 143  K 4.1  BUN  17  CREATININE 0.9    Recent Labs  03/02/15  AST 17  ALT 9  ALKPHOS 86    Recent Labs  03/02/15  WBC 4.7  HGB 13.3  HCT 40  PLT 203   Lab Results  Component Value Date   TSH 1.04 06/08/2015   No results found for: HGBA1C Lab Results  Component Value Date   CHOL 168 08/10/2011   HDL 77.70 08/10/2011   LDLCALC 81 08/10/2011   TRIG 46.0 08/10/2011   CHOLHDL 2 08/10/2011    Significant Diagnostic Results in last 30 days:  No results found.  Assessment/Plan  Post menopausal syndrome Takes Premarin 0.3mg  daily for 21 days then off for 7days. The patient is 83 years of age and failed alternative in the past per the patient. Medically necessary to continue the same therapy.   Hypothyroidism Continue Levothyroxine to 34mcg qod due to low TSH 0.182, last 06/08/15 TSH 1.042, update TSH  HTN (hypertension) Blood pressure is controlled, takes Benazepril 20mg , Furosemide 20mg ,  Metoprolol 25mg  bid.   Chronic anticoagulation Takes Coumadin for PT/EVT risk reduction.   CHF (congestive heart failure) Compensated clinically, seen chronic trace ankle edema, continue Furosemide 20mg , monitor the patient weight closely.   Atrial fibrillation Heart rate is in control, continue Metoprolol.     Family/ staff Communication: continue [facility] for care needs  Labs/tests ordered:  CBC, CMP, TSH

## 2015-10-28 NOTE — Assessment & Plan Note (Signed)
Heart rate is in control, continue Metoprolol  

## 2015-10-28 NOTE — Assessment & Plan Note (Signed)
Compensated clinically, seen chronic trace ankle edema, continue Furosemide 20mg , monitor the patient weight closely.

## 2015-10-28 NOTE — Assessment & Plan Note (Signed)
Takes Premarin 0.3mg  daily for 21 days then off for 7days. The patient is 82 years of age and failed alternative in the past per the patient. Medically necessary to continue the same therapy.

## 2015-10-28 NOTE — Assessment & Plan Note (Signed)
Takes Coumadin for PT/EVT risk reduction.  

## 2015-10-28 NOTE — Assessment & Plan Note (Signed)
Continue Levothyroxine to 48mcg qod due to low TSH 0.182, last 06/08/15 TSH 1.042, update TSH

## 2015-10-28 NOTE — Assessment & Plan Note (Signed)
Blood pressure is controlled, takes Benazepril 20mg , Furosemide 20mg ,  Metoprolol 25mg  bid.

## 2015-11-02 LAB — BASIC METABOLIC PANEL
BUN: 15 mg/dL (ref 4–21)
Creatinine: 0.7 mg/dL (ref 0.5–1.1)
Glucose: 73 mg/dL
POTASSIUM: 4.5 mmol/L (ref 3.4–5.3)
Sodium: 141 mmol/L (ref 137–147)

## 2015-11-02 LAB — HEPATIC FUNCTION PANEL
ALT: 26 U/L (ref 7–35)
AST: 28 U/L (ref 13–35)
Alkaline Phosphatase: 158 U/L — AB (ref 25–125)
BILIRUBIN, TOTAL: 0.8 mg/dL

## 2015-11-02 LAB — CBC AND DIFFERENTIAL
HEMATOCRIT: 37 % (ref 36–46)
HEMOGLOBIN: 11.9 g/dL — AB (ref 12.0–16.0)
PLATELETS: 182 10*3/uL (ref 150–399)
WBC: 4.5 10*3/mL

## 2015-11-02 LAB — TSH: TSH: 1.46 u[IU]/mL (ref 0.41–5.90)

## 2015-11-11 ENCOUNTER — Encounter: Payer: Self-pay | Admitting: Nurse Practitioner

## 2015-11-11 DIAGNOSIS — D638 Anemia in other chronic diseases classified elsewhere: Secondary | ICD-10-CM | POA: Insufficient documentation

## 2015-11-15 NOTE — Progress Notes (Signed)
This encounter was created in error - please disregard.

## 2015-11-18 ENCOUNTER — Encounter: Payer: Self-pay | Admitting: Internal Medicine

## 2015-11-18 ENCOUNTER — Non-Acute Institutional Stay: Payer: Medicare Other | Admitting: Internal Medicine

## 2015-11-18 VITALS — BP 102/60 | HR 67 | Temp 97.3°F | Ht 61.0 in | Wt 101.0 lb

## 2015-11-18 DIAGNOSIS — Z7901 Long term (current) use of anticoagulants: Secondary | ICD-10-CM

## 2015-11-18 DIAGNOSIS — K219 Gastro-esophageal reflux disease without esophagitis: Secondary | ICD-10-CM | POA: Diagnosis not present

## 2015-11-18 DIAGNOSIS — I480 Paroxysmal atrial fibrillation: Secondary | ICD-10-CM

## 2015-11-18 DIAGNOSIS — E039 Hypothyroidism, unspecified: Secondary | ICD-10-CM | POA: Diagnosis not present

## 2015-11-18 DIAGNOSIS — Z780 Asymptomatic menopausal state: Secondary | ICD-10-CM | POA: Diagnosis not present

## 2015-11-18 DIAGNOSIS — N951 Menopausal and female climacteric states: Secondary | ICD-10-CM

## 2015-11-18 DIAGNOSIS — I10 Essential (primary) hypertension: Secondary | ICD-10-CM | POA: Diagnosis not present

## 2015-11-18 DIAGNOSIS — R609 Edema, unspecified: Secondary | ICD-10-CM

## 2015-11-18 DIAGNOSIS — I5040 Unspecified combined systolic (congestive) and diastolic (congestive) heart failure: Secondary | ICD-10-CM

## 2015-11-18 NOTE — Progress Notes (Signed)
Patient ID: Rose Weber, female   DOB: August 09, 1913, 82 y.o.   MRN: [id number]   Location:  [facility] Room Number: [id number] [facility] [id number])  Provider: Jeanmarie Hubert, MD  Code Status: DNR Goals of Care:  Advanced Directives 11/18/2015  Does patient have an advance directive? Yes  Type of Paramedic of [place];Living will;Out of facility DNR (pink MOST or yellow form)  Does patient want to make changes to advanced directive? -  Copy of advanced directive(s) in chart? Yes     Chief Complaint  Patient presents with  . Medical Management of Chronic Issues    medication management blood pressure, A-Fib, CHF, thyroid    HPI: Patient is a 82 y.o. female seen today for medical management of chronic diseases.    She was last seen by [facility] 10/28/2015. Diagnosis considered included hypothyroidism, postmenopausal syndrome, hypertension, chronic anticoagulation, combined systolic and diastolic congestive heart failure, and atrial fibrillation.  Patient feels like she is doing pretty well, but she also believes she is "taking too much medications. She would like to review them reduced for possible.  Hypertension - controlled  Anticoagulation - remains on warfarin for atrial fibrillation  Patient has been on Premarin for many years for close postmenopausal symptoms". She is not having any serious problems with diaphoresis or other postmenopausal symptoms at this time.     Past Medical History  Diagnosis Date  . HTN (hypertension)   . MVP (mitral valve prolapse)   . Mitral insufficiency     SEVERE  . Chronic anticoagulation   . Edema   . GERD (gastroesophageal reflux disease)   . Personal history of goiter   . Hypothyroidism   . CHF (congestive heart failure) ([place])   . Mitral valve prolapse     With severe mitral insufficiency  . Atrial fibrillation ([place])   . Goiter     History of goiter  . PNA (pneumonia)     strep  5/14  . Other malaise and fatigue 01/25/2012  . Visual disturbances 10/12/2010  . Benign neoplasm of colon 04/09/2003  . Cardiac dysrhythmia, unspecified 09/26/2000  . Other corneal degenerations 02/19/1986    Fuchs corneal dystrophy    Past Surgical History  Procedure Laterality Date  . Cardioversion  04/14/10  . Appendectomy  1932  . Small intestine surgery      SBO 2000  . Abdominal hysterectomy  1962  . Corneal transplant      MULTIPLE  . Rectal polypectomy    . Eye surgery Left 07/2013    replaced valve in eye    Allergies  Allergen Reactions  . Diphenhydramine Hcl Shortness Of Breath  . Alphagan [Brimonidine Tartrate]   . Amlodipine Other (See Comments)    Excess edema  . Avelox [Moxifloxacin Hcl In Nacl]   . Benadryl [Diphenhydramine]   . Benzalkonium Other (See Comments)  . Brimonidine Other (See Comments)    Other Reaction: increased HR and SOB  . Bystolic [Nebivolol Hcl]   . Combigan [Brimonidine Tartrate-Timolol]   . Difluprednate Other (See Comments)    Other Reaction: increased HR, and SOB  . Diltiazem   . Diovan [Valsartan]   . Erythromycin   . Estradiol Other (See Comments)  . Fluorometholone Other (See Comments)  . Nebivolol Other (See Comments)  . Other     PRESERVATIVES  . Penicillins   . Polymyxin B-Trimethoprim   . Restasis [Cyclosporine]   . Sulfa Drugs  [employer]   . Tekturna [Aliskiren Fumarate]   . Travatan Z   . Xalatan [Latanoprost]   . Lactase Other (See Comments)    Other Reaction: GI Upset      Medication List       This list is accurate as of: 11/18/15  2:58 PM.  Always use your most recent med list.               carboxymethylcellulose 0.5 % Soln  Commonly known as:  REFRESH PLUS  Instill 1 drop into both eyes four times daily     CITRACAL PO  Take by mouth. 240-40-125 take 3 daily     [employer] BACITRACIN ointment  Generic drug:  bacitracin  Apply 1 application topically. Apply thin line to both eyes at bedtime      cycloSPORINE 0.05 % ophthalmic emulsion  Commonly known as:  RESTASIS  Apply 1 drop to eye 3 (three) times daily.     estrogens (conjugated) 0.3 MG tablet  Commonly known as:  PREMARIN  Take 0.3 mg by mouth. Take daily for 21 days then skip 7 days. Then repeat cycle     furosemide 20 MG tablet  Commonly known as:  LASIX  Take 3 tablets (60 mg total) by mouth 2 (two) times daily.     levothyroxine 25 MCG tablet  Commonly known as:  SYNTHROID, LEVOTHROID  Take one tablet every other day * pulse weekly on Wednesday*     LOTENSIN 20 MG tablet  Generic drug:  benazepril  TAKE 1 TABLET TWICE DAILY.     metoprolol tartrate 25 MG tablet  Commonly known as:  LOPRESSOR  One twice daily to control blood pressure     polyvinyl alcohol 1.4 % ophthalmic solution  Commonly known as:  LIQUIFILM TEARS  Place 1 drop into both eyes QID.     potassium chloride SA 20 MEQ tablet  Commonly known as:  K-DUR,KLOR-CON  Take 20 meq twice a day     prednisoLONE acetate 1 % ophthalmic suspension  Commonly known as:  PRED FORTE  Place 1 drop into both eyes. One drop both eyes 2 times daily     warfarin 4 MG tablet  Commonly known as:  COUMADIN  4 mg. One tablet by mouth daily except Monday and Wednesday     warfarin 3 MG tablet  Commonly known as:  COUMADIN  Take 1 tablet by mouth on Monday and Wednesday        Review of Systems:  Review of Systems  Constitutional: Negative for fever, chills, diaphoresis, activity change, appetite change and fatigue.  HENT: Positive for hearing loss. Negative for congestion.   Eyes: Negative.  Negative for discharge and redness.  Respiratory: Negative.  Negative for cough, shortness of breath and wheezing.   Cardiovascular: Positive for leg swelling. Negative for palpitations.       Trace edema in ankles.   Gastrointestinal: Negative for abdominal pain and constipation.  Endocrine:       Hyperglycemia Hypothyroid. Goiter  Genitourinary: Positive for  frequency. Negative for dysuria and hematuria.  Musculoskeletal: Negative.  Negative for myalgias, back pain and neck pain.  Skin: Negative.  Negative for rash.  Allergic/Immunologic: Negative.   Neurological: Negative.  Negative for dizziness, tremors, seizures and headaches.  Hematological: Negative.  Does not bruise/bleed easily.  Psychiatric/Behavioral: Negative.  Negative for suicidal ideas and hallucinations. The patient is not nervous/anxious.     Health Maintenance  Topic Date Due  . TETANUS/TDAP  09/15/1933  . ZOSTAVAX  09/15/1974  . DEXA SCAN  09/16/1979  . PNA vac Low Risk Adult (2 of 2 - PCV13) 08/21/2010  . INFLUENZA VACCINE  07/24/2016 (Originally 03/22/2015)    Physical Exam: Filed Vitals:   11/18/15 1427  BP: 102/60  Pulse: 67  Temp: 97.3 F (36.3 C)  TempSrc: Oral  Height: 5\' 1"  (1.549 m)  Weight: 101 lb (45.813 kg)  SpO2: 90%   Body mass index is 19.09 kg/(m^2). Physical Exam  Constitutional: She is oriented to person, place, and time. She appears well-developed and well-nourished. No distress.  HENT:  Head: Normocephalic and atraumatic.  Right Ear: External ear normal.  Left Ear: External ear normal.  Mouth/Throat: No oropharyngeal exudate.  Torus palatinus.   Eyes: Conjunctivae are normal. Pupils are equal, round, and reactive to light. No scleral icterus.  Corneal transplants. Bilateral ectropion of the lower lids.  Neck: Normal range of motion. Neck supple. No JVD present. No thyromegaly (goitor ) present.  Cardiovascular: Normal rate.  An irregular rhythm present.  Murmur heard.  Systolic murmur is present with a grade of 4/6  Systolic murmur 123456. AF.  Pulmonary/Chest: Effort normal. No stridor. No respiratory distress. She has no wheezes. She has no rales.  Abdominal: Soft. Bowel sounds are normal. There is no tenderness.  Musculoskeletal: Normal range of motion. She exhibits edema. She exhibits no tenderness.  Trace edema ankle.     Lymphadenopathy:    She has no cervical adenopathy.  Neurological: She is alert and oriented to person, place, and time. She has normal reflexes. No cranial nerve deficit. She exhibits normal muscle tone. Coordination normal.  02/25/15 MMSE 28/30. Failed clock drawing.  Skin: Skin is warm and dry. No rash noted. She is not diaphoretic. No pallor.  Psychiatric: She has a normal mood and affect. Her behavior is normal. Judgment and thought content normal.    Labs reviewed: Basic Metabolic Panel:  Recent Labs  03/02/15 06/08/15 11/02/15  NA 143  --  141  K 4.1  --  4.5  BUN 17  --  15  CREATININE 0.9  --  0.7  TSH 0.18* 1.04 1.46   Liver Function Tests:  Recent Labs  03/02/15 11/02/15  AST 17 28  ALT 9 26  ALKPHOS 86 158*   No results for input(s): LIPASE, AMYLASE in the last 8760 hours. No results for input(s): AMMONIA in the last 8760 hours. CBC:  Recent Labs  03/02/15 11/02/15  WBC 4.7 4.5  HGB 13.3 11.9*  HCT 40 37  PLT 203 182     Assessment/Plan  1. Combined systolic and diastolic congestive heart failure, unspecified congestive heart failure chronicity (HCC) Reduce furosemide to 20 mg daily Decreased KCl 20 mEq daily  2. Hypothyroidism, unspecified hypothyroidism type Compensated on current medication  3. Essential hypertension Reduce furosemide to 20 mg daily Continue metoprolol  4. Gastroesophageal reflux disease without esophagitis Asymptomatic  5. Edema, unspecified type Well-controlled. Reduced furosemide to 20 mEq daily.  6. Chronic anticoagulation For atrial fibrillation  7. Post menopausal syndrome Discontinue Premarin  8. Paroxysmal atrial fibrillation (HCC) Remains on metoprolol and warfarin  Medication was thoroughly reviewed . In addition to the changes noted above I also will discontinue prednisolone ophthalmic drops and Citracal.

## 2015-11-28 MED ORDER — FUROSEMIDE 20 MG PO TABS: ORAL_TABLET | ORAL | Status: AC

## 2015-11-28 MED ORDER — BENAZEPRIL HCL 20 MG PO TABS: ORAL_TABLET | ORAL | Status: AC

## 2015-11-28 MED ORDER — POTASSIUM CHLORIDE CRYS ER 20 MEQ PO TBCR: EXTENDED_RELEASE_TABLET | ORAL | Status: AC

## 2015-12-30 ENCOUNTER — Other Ambulatory Visit: Payer: Self-pay

## 2015-12-30 LAB — PROTIME-INR: Protime: 3 seconds — AB (ref 10.0–13.8)

## 2015-12-30 NOTE — Patient Instructions (Signed)
Continue 4 mg daily, repeat INR in one month per Dr. Nyoka Cowden

## 2016-01-06 ENCOUNTER — Non-Acute Institutional Stay: Payer: Medicare Other | Admitting: Nurse Practitioner

## 2016-01-06 DIAGNOSIS — E039 Hypothyroidism, unspecified: Secondary | ICD-10-CM | POA: Diagnosis not present

## 2016-01-06 DIAGNOSIS — D638 Anemia in other chronic diseases classified elsewhere: Secondary | ICD-10-CM

## 2016-01-06 DIAGNOSIS — R609 Edema, unspecified: Secondary | ICD-10-CM | POA: Diagnosis not present

## 2016-01-06 DIAGNOSIS — Z7901 Long term (current) use of anticoagulants: Secondary | ICD-10-CM | POA: Diagnosis not present

## 2016-01-06 DIAGNOSIS — I10 Essential (primary) hypertension: Secondary | ICD-10-CM

## 2016-01-06 DIAGNOSIS — I482 Chronic atrial fibrillation: Secondary | ICD-10-CM

## 2016-01-06 DIAGNOSIS — I5040 Unspecified combined systolic (congestive) and diastolic (congestive) heart failure: Secondary | ICD-10-CM

## 2016-01-06 DIAGNOSIS — I4821 Permanent atrial fibrillation: Secondary | ICD-10-CM

## 2016-01-06 DIAGNOSIS — K219 Gastro-esophageal reflux disease without esophagitis: Secondary | ICD-10-CM | POA: Diagnosis not present

## 2016-01-06 DIAGNOSIS — I341 Nonrheumatic mitral (valve) prolapse: Secondary | ICD-10-CM

## 2016-01-06 DIAGNOSIS — Z8639 Personal history of other endocrine, nutritional and metabolic disease: Secondary | ICD-10-CM | POA: Diagnosis not present

## 2016-01-06 NOTE — Assessment & Plan Note (Signed)
Blood pressure is controlled, takes Benazepril 20mg , Furosemide 20mg ,  Metoprolol 25mg  bid. 11/02/15 Na 141, K 4.5, Bun 15, creat 0.74

## 2016-01-06 NOTE — Assessment & Plan Note (Signed)
Takes Coumadin for PT/EVT risk reduction.  

## 2016-01-06 NOTE — Assessment & Plan Note (Signed)
Compensated clinically, seen chronic trace ankle edema, continue Furosemide 20mg , monitor the patient weight closely. 11/02/15 Na 141, K 4.5, Bun 15, creat 0.74

## 2016-01-06 NOTE — Assessment & Plan Note (Signed)
11/02/15 Hgb 11.9

## 2016-01-06 NOTE — Progress Notes (Signed)
Patient ID: Rose Weber, female   DOB: December 16, 1912, 82 y.o.   MRN: [id number]  Location:  [place] Room Number: [id number] Place of Service: [facility] Provider:  Lennie Odor Mast NP  Estill Dooms, MD  Patient Care Team: Estill Dooms, MD as PCP - General (Internal Medicine) Peter M Martinique, MD as Consulting Physician (Cardiology) Ronald Lobo, MD as Consulting Physician (Gastroenterology)  Extended Emergency Contact Information Primary Emergency Contact: Galen Daft [place] of [place] Phone: [phone] Relation: Friend  Code Status:  DNR Goals of care: Advanced Directive information Advanced Directives 01/06/2016  Does patient have an advance directive? Yes  Type of Paramedic of [place];Living will;Out of facility DNR (pink MOST or yellow form)  Does patient want to make changes to advanced directive? No - Patient declined  Copy of advanced directive(s) in chart? Yes     Chief Complaint  Patient presents with  . Medical Management of Chronic Issues    Routine Visit    HPI:  Pt is a 82 y.o. female seen today for medical management of chronic diseases.  Hx of thyroid, taking Levothyroxine 17mcg, last TSH 1.46 11/02/15, edema, trace ankles, Furosemide 20mg , Kcl 43meq , HTN, controlled while Lotensin 20mg , Metoprolol 25mg  bid, chronic anticoagulation therapy on Coumadin, taking Premarin for post menopausal symptoms.     Past Medical History  Diagnosis Date  . HTN (hypertension)   . MVP (mitral valve prolapse)   . Mitral insufficiency     SEVERE  . Chronic anticoagulation   . Edema   . GERD (gastroesophageal reflux disease)   . Personal history of goiter   . Hypothyroidism   . CHF (congestive heart failure) ([place])   . Mitral valve prolapse     With severe mitral insufficiency  . Atrial fibrillation ([place])   . Goiter     History of goiter  . PNA (pneumonia)     strep  5/14  . Other malaise and fatigue  01/25/2012  . Visual disturbances 10/12/2010  . Benign neoplasm of colon 04/09/2003  . Cardiac dysrhythmia, unspecified 09/26/2000  . Other corneal degenerations 02/19/1986    Fuchs corneal dystrophy   Past Surgical History  Procedure Laterality Date  . Cardioversion  04/14/10  . Appendectomy  1932  . Small intestine surgery      SBO 2000  . Abdominal hysterectomy  1962  . Corneal transplant      MULTIPLE  . Rectal polypectomy    . Eye surgery Left 07/2013    replaced valve in eye    Allergies  Allergen Reactions  . Diphenhydramine Hcl Shortness Of Breath  . Alphagan [Brimonidine Tartrate]   . Amlodipine Other (See Comments)    Excess edema  . Avelox [Moxifloxacin Hcl In Nacl]   . Benadryl [Diphenhydramine]   . Benzalkonium Other (See Comments)  . Brimonidine Other (See Comments)    Other Reaction: increased HR and SOB  . Bystolic [Nebivolol Hcl]   . Combigan [Brimonidine Tartrate-Timolol]   . Difluprednate Other (See Comments)    Other Reaction: increased HR, and SOB  . Diltiazem   . Diovan [Valsartan]   . Erythromycin   . Estradiol Other (See Comments)  . Fluorometholone Other (See Comments)  . Nebivolol Other (See Comments)  . Other     PRESERVATIVES  . Penicillins   . Polymyxin B-Trimethoprim   . Restasis [Cyclosporine]   . Sulfa Drugs Cross Reactors   . Tekturna [Aliskiren Fumarate]   .  Travatan Z   . Xalatan [Latanoprost]   . Lactase Other (See Comments)    Other Reaction: GI Upset      Medication List       This list is accurate as of: 01/06/16  1:48 PM.  Always use your most recent med list.               benazepril 20 MG tablet  Commonly known as:  LOTENSIN  Take 1 tablet each morning to control blood pressure and strengthen the heart     carboxymethylcellulose 0.5 % Soln  Commonly known as:  REFRESH PLUS  Instill 1 drop into both eyes four times daily     [employer] BACITRACIN ointment  Generic drug:  bacitracin  Apply 1 application topically. Apply  thin line to both eyes at bedtime     cycloSPORINE 0.05 % ophthalmic emulsion  Commonly known as:  RESTASIS  Apply 1 drop to eye 2 (two) times daily.     furosemide 20 MG tablet  Commonly known as:  LASIX  Take one tablet each morning to control fluid retention     levothyroxine 25 MCG tablet  Commonly known as:  SYNTHROID, LEVOTHROID  Take one tablet every other day * pulse weekly on Wednesday*     metoprolol tartrate 25 MG tablet  Commonly known as:  LOPRESSOR  One twice daily to control blood pressure     polyvinyl alcohol 1.4 % ophthalmic solution  Commonly known as:  LIQUIFILM TEARS  Place 1 drop into both eyes QID.     potassium chloride SA 20 MEQ tablet  Commonly known as:  K-DUR,KLOR-CON  Take 20 meq each day for potassium supplement     warfarin 4 MG tablet  Commonly known as:  COUMADIN  Take 4 mg by mouth daily.        Review of Systems  Constitutional: Negative for chills.  HENT: Positive for hearing loss. Negative for congestion.   Eyes: Negative.  Negative for discharge and redness.  Respiratory: Negative.  Negative for cough, shortness of breath and wheezing.   Cardiovascular: Positive for leg swelling. Negative for palpitations.       Trace edema in ankles.   Gastrointestinal: Negative for abdominal pain and constipation.  Genitourinary: Positive for frequency. Negative for dysuria and hematuria.  Musculoskeletal: Negative.  Negative for myalgias, back pain and neck pain.  Skin: Negative.  Negative for rash.  Neurological: Negative.  Negative for dizziness, tremors, seizures and headaches.  Hematological: Does not bruise/bleed easily.  Psychiatric/Behavioral: Negative.  Negative for suicidal ideas and hallucinations. The patient is not nervous/anxious.     Immunization History  Administered Date(s) Administered  . Influenza Whole 06/04/2013  . Influenza-Unspecified 06/03/2015  . PPD Test 01/16/2014  . Pneumococcal Polysaccharide-23 08/21/2009    Pertinent  Health Maintenance Due  Topic Date Due  . DEXA SCAN  09/16/1979  . PNA vac Low Risk Adult (2 of 2 - PCV13) 08/21/2010  . INFLUENZA VACCINE  03/21/2016   Fall Risk  11/18/2015 02/25/2015  Falls in the past year? No No   Functional Status Survey:    Filed Vitals:   01/06/16 1041  BP: 142/80  Pulse: 70  Temp: 97.4 F (36.3 C)  TempSrc: Oral  Resp: 16  Height: 5\' 1"  (1.549 m)  Weight: 101 lb 3.2 oz (45.904 kg)   Body mass index is 19.13 kg/(m^2). Physical Exam  Constitutional: She is oriented to person, place, and time. She appears well-developed and well-nourished. No  distress.  HENT:  Head: Normocephalic and atraumatic.  Right Ear: External ear normal.  Left Ear: External ear normal.  Mouth/Throat: No oropharyngeal exudate.  Torus palatinus.   Eyes: Conjunctivae are normal. Pupils are equal, round, and reactive to light. No scleral icterus.  Corneal transplants. Bilateral ectropion of the lower lids.  Neck: Normal range of motion. Neck supple. No JVD present. No thyromegaly (goitor ) present.  Cardiovascular: Normal rate.  An irregular rhythm present.  Murmur heard.  Systolic murmur is present with a grade of 4/6  Systolic murmur 123456. AF.  Pulmonary/Chest: Effort normal. No stridor. No respiratory distress. She has no wheezes. She has no rales.  Abdominal: Soft. Bowel sounds are normal. There is no tenderness.  Musculoskeletal: Normal range of motion. She exhibits edema. She exhibits no tenderness.  Trace edema ankle.   Lymphadenopathy:    She has no cervical adenopathy.  Neurological: She is alert and oriented to person, place, and time. She has normal reflexes. No cranial nerve deficit. She exhibits normal muscle tone. Coordination normal.  02/25/15 MMSE 28/30. Failed clock drawing.  Skin: Skin is warm and dry. No rash noted. She is not diaphoretic. No pallor.  Psychiatric: She has a normal mood and affect. Her behavior is normal. Judgment and thought  content normal.    Labs reviewed:  Recent Labs  03/02/15 11/02/15  NA 143 141  K 4.1 4.5  BUN 17 15  CREATININE 0.9 0.7    Recent Labs  03/02/15 11/02/15  AST 17 28  ALT 9 26  ALKPHOS 86 158*    Recent Labs  03/02/15 11/02/15  WBC 4.7 4.5  HGB 13.3 11.9*  HCT 40 37  PLT 203 182   Lab Results  Component Value Date   TSH 1.46 11/02/2015   No results found for: HGBA1C Lab Results  Component Value Date   CHOL 168 08/10/2011   HDL 77.70 08/10/2011   LDLCALC 81 08/10/2011   TRIG 46.0 08/10/2011   CHOLHDL 2 08/10/2011    Significant Diagnostic Results in last 30 days:  No results found.  Assessment/Plan  Personal history of goiter 11/02/15 TSH 1.46   Hypothyroidism Continue Levothyroxine 57mcg qod, 11/02/15 TSH 1.46  HTN (hypertension) Blood pressure is controlled, takes Benazepril 20mg , Furosemide 20mg ,  Metoprolol 25mg  bid. 11/02/15 Na 141, K 4.5, Bun 15, creat 0.74    GERD (gastroesophageal reflux disease) Stable.   Edema Stable, 11/02/15 Na 141, K 4.5, Bun 15, creat 0.74   Chronic anticoagulation Takes Coumadin for PT/EVT risk reduction.    CHF (congestive heart failure) ([place]) Compensated clinically, seen chronic trace ankle edema, continue Furosemide 20mg , monitor the patient weight closely. 11/02/15 Na 141, K 4.5, Bun 15, creat 0.74    Atrial fibrillation (HCC) Heart rate is in control, continue Metoprolol.    Anemia of chronic disease 11/02/15 Hgb 11.9     Family/ staff Communication: continue [facility] for care needs  Labs/tests ordered:  none

## 2016-01-06 NOTE — Assessment & Plan Note (Deleted)
11/02/15 TSH 1.46

## 2016-01-06 NOTE — Assessment & Plan Note (Signed)
Continue Levothyroxine 82mcg qod, 11/02/15 TSH 1.46

## 2016-01-06 NOTE — Assessment & Plan Note (Signed)
11/02/15 TSH 1.46

## 2016-01-06 NOTE — Assessment & Plan Note (Signed)
Heart rate is in control, continue Metoprolol  

## 2016-01-06 NOTE — Assessment & Plan Note (Addendum)
Stable, 11/02/15 Na 141, K 4.5, Bun 15, creat 0.74

## 2016-01-06 NOTE — Assessment & Plan Note (Signed)
Stable

## 2016-01-27 ENCOUNTER — Non-Acute Institutional Stay: Payer: Medicare Other | Admitting: Nurse Practitioner

## 2016-01-27 ENCOUNTER — Encounter: Payer: Self-pay | Admitting: Nurse Practitioner

## 2016-01-27 DIAGNOSIS — D638 Anemia in other chronic diseases classified elsewhere: Secondary | ICD-10-CM | POA: Diagnosis not present

## 2016-01-27 DIAGNOSIS — K219 Gastro-esophageal reflux disease without esophagitis: Secondary | ICD-10-CM

## 2016-01-27 DIAGNOSIS — E039 Hypothyroidism, unspecified: Secondary | ICD-10-CM

## 2016-01-27 DIAGNOSIS — I5020 Unspecified systolic (congestive) heart failure: Secondary | ICD-10-CM | POA: Diagnosis not present

## 2016-01-27 DIAGNOSIS — I480 Paroxysmal atrial fibrillation: Secondary | ICD-10-CM

## 2016-01-27 DIAGNOSIS — I10 Essential (primary) hypertension: Secondary | ICD-10-CM | POA: Diagnosis not present

## 2016-01-27 DIAGNOSIS — R609 Edema, unspecified: Secondary | ICD-10-CM

## 2016-01-27 NOTE — Assessment & Plan Note (Signed)
Compensated clinically, seen chronic ankle edema, continue Furosemide 20mg , monitor the patient weight closely. 11/02/15 Na 141, K 4.5, Bun 15, creat 0.74

## 2016-01-27 NOTE — Assessment & Plan Note (Signed)
Stable

## 2016-01-27 NOTE — Progress Notes (Signed)
Patient ID: Rose Weber, female   DOB: 02/06/1913, 81 y.o.   MRN: [id number]  Location:  [place] Room Number: [id number] Place of Service: [facility] Provider:  Lennie Odor Raeleigh Guinn NP  Estill Dooms, MD  Patient Care Team: Estill Dooms, MD as PCP - General (Internal Medicine) Peter M Martinique, MD as Consulting Physician (Cardiology) Ronald Lobo, MD as Consulting Physician (Gastroenterology)  Extended Emergency Contact Information Primary Emergency Contact: Rose Weber States of [place] Phone: [phone] Relation: Friend  Code Status:  DNR Goals of care: Advanced Directive information Advanced Directives 01/27/2016  Does patient have an advance directive? Yes  Type of Paramedic of [place];Out of facility DNR (pink MOST or yellow form);Living will  Does patient want to make changes to advanced directive? No - Patient declined  Copy of advanced directive(s) in chart? Yes     Chief Complaint  Patient presents with  . Acute Visit    legs swelling bilaterally,small bruise seen 2 days ago.    HPI:  Pt is a 82 y.o. female seen today for medical management of chronic diseases.  Hx of thyroid, taking Levothyroxine 18mcg, last TSH 1.46 11/02/15, edema, worse in ankles since Furosemide 20mg , Kcl 71meq, weights are stable, no increased SOB<  HTN, controlled while Lotensin 20mg , Metoprolol 25mg  bid, chronic anticoagulation therapy on Coumadin, taking Premarin for post menopausal symptoms.     Past Medical History  Diagnosis Date  . HTN (hypertension)   . MVP (mitral valve prolapse)   . Mitral insufficiency     SEVERE  . Chronic anticoagulation   . Edema   . GERD (gastroesophageal reflux disease)   . Personal history of goiter   . Hypothyroidism   . CHF (congestive heart failure) ([place])   . Mitral valve prolapse     With severe mitral insufficiency  . Atrial fibrillation ([place])   . Goiter     History of goiter  . PNA  (pneumonia)     strep  5/14  . Other malaise and fatigue 01/25/2012  . Visual disturbances 10/12/2010  . Benign neoplasm of colon 04/09/2003  . Cardiac dysrhythmia, unspecified 09/26/2000  . Other corneal degenerations 02/19/1986    Fuchs corneal dystrophy   Past Surgical History  Procedure Laterality Date  . Cardioversion  04/14/10  . Appendectomy  1932  . Small intestine surgery      SBO 2000  . Abdominal hysterectomy  1962  . Corneal transplant      MULTIPLE  . Rectal polypectomy    . Eye surgery Left 07/2013    replaced valve in eye    Allergies  Allergen Reactions  . Diphenhydramine Hcl Shortness Of Breath  . Alphagan [Brimonidine Tartrate]   . Amlodipine Other (See Comments)    Excess edema  . Avelox [Moxifloxacin Hcl In Nacl]   . Benadryl [Diphenhydramine]   . Benzalkonium Other (See Comments)  . Brimonidine Other (See Comments)    Other Reaction: increased HR and SOB  . Bystolic [Nebivolol Hcl]   . Combigan [Brimonidine Tartrate-Timolol]   . Difluprednate Other (See Comments)    Other Reaction: increased HR, and SOB  . Diltiazem   . Diovan [Valsartan]   . Erythromycin   . Estradiol Other (See Comments)  . Fluorometholone Other (See Comments)  . Nebivolol Other (See Comments)  . Other     PRESERVATIVES  . Penicillins   . Polymyxin B-Trimethoprim   . Restasis [Cyclosporine]   . Sulfa  Drugs Cross Reactors   . Tekturna [Aliskiren Fumarate]   . Travatan Z   . Xalatan [Latanoprost]   . Lactase Other (See Comments)    Other Reaction: GI Upset      Medication List       This list is accurate as of: 01/27/16 11:59 PM.  Always use your most recent med list.               benazepril 20 MG tablet  Commonly known as:  LOTENSIN  Take 1 tablet each morning to control blood pressure and strengthen the heart     carboxymethylcellulose 0.5 % Soln  Commonly known as:  REFRESH PLUS  Instill 1 drop into both eyes four times daily     [employer] BACITRACIN ointment    Generic drug:  bacitracin  Apply 1 application topically. Apply thin line to both eyes at bedtime     cycloSPORINE 0.05 % ophthalmic emulsion  Commonly known as:  RESTASIS  Apply 1 drop to eye 2 (two) times daily. Reported on 01/27/2016     furosemide 20 MG tablet  Commonly known as:  LASIX  Take one tablet each morning to control fluid retention     levothyroxine 25 MCG tablet  Commonly known as:  SYNTHROID, LEVOTHROID  Take one tablet every other day * pulse weekly on Wednesday*     metoprolol tartrate 25 MG tablet  Commonly known as:  LOPRESSOR  One twice daily to control blood pressure     polyvinyl alcohol 1.4 % ophthalmic solution  Commonly known as:  LIQUIFILM TEARS  Place 1 drop into both eyes QID.     potassium chloride SA 20 MEQ tablet  Commonly known as:  K-DUR,KLOR-CON  Take 20 meq each day for potassium supplement     warfarin 4 MG tablet  Commonly known as:  COUMADIN  Take 4 mg by mouth daily.        Review of Systems  Constitutional: Negative for chills.  HENT: Positive for hearing loss. Negative for congestion.   Eyes: Negative.  Negative for discharge and redness.  Respiratory: Negative.  Negative for cough, shortness of breath and wheezing.   Cardiovascular: Positive for leg swelling. Negative for palpitations.       Trace edema in ankles.   Gastrointestinal: Negative for abdominal pain and constipation.  Genitourinary: Positive for frequency. Negative for dysuria and hematuria.  Musculoskeletal: Negative.  Negative for myalgias, back pain and neck pain.  Skin: Negative.  Negative for rash.  Neurological: Negative.  Negative for dizziness, tremors, seizures and headaches.  Hematological: Does not bruise/bleed easily.  Psychiatric/Behavioral: Negative.  Negative for suicidal ideas and hallucinations. The patient is not nervous/anxious.     Immunization History  Administered Date(s) Administered  . Influenza Whole 06/04/2013  .  Influenza-Unspecified 06/03/2015  . PPD Test 01/16/2014  . Pneumococcal Polysaccharide-23 08/21/2009   Pertinent  Health Maintenance Due  Topic Date Due  . DEXA SCAN  09/16/1979  . PNA vac Low Risk Adult (2 of 2 - PCV13) 08/21/2010  . INFLUENZA VACCINE  03/21/2016   Fall Risk  11/18/2015 02/25/2015  Falls in the past year? No No   Functional Status Survey:    Filed Vitals:   01/27/16 1229  BP: 132/72  Pulse: 80  Temp: 97.2 F (36.2 C)  TempSrc: Oral  Resp: 18  Height: 5\' 1"  (1.549 m)  Weight: 101 lb 3.2 oz (45.904 kg)   Body mass index is 19.13 kg/(m^2). Physical Exam  Constitutional: She is oriented to person, place, and time. She appears well-developed and well-nourished. No distress.  HENT:  Head: Normocephalic and atraumatic.  Right Ear: External ear normal.  Left Ear: External ear normal.  Mouth/Throat: No oropharyngeal exudate.  Torus palatinus.   Eyes: Conjunctivae are normal. Pupils are equal, round, and reactive to light. No scleral icterus.  Corneal transplants. Bilateral ectropion of the lower lids.  Neck: Normal range of motion. Neck supple. No JVD present. No thyromegaly (goitor ) present.  Cardiovascular: Normal rate.  An irregular rhythm present.  Murmur heard.  Systolic murmur is present with a grade of 4/6  Systolic murmur 123456. AF.  Pulmonary/Chest: Effort normal. No stridor. No respiratory distress. She has no wheezes. She has no rales.  Abdominal: Soft. Bowel sounds are normal. There is no tenderness.  Musculoskeletal: Normal range of motion. She exhibits edema. She exhibits no tenderness.  1+ edema ankle.   Lymphadenopathy:    She has no cervical adenopathy.  Neurological: She is alert and oriented to person, place, and time. She has normal reflexes. No cranial nerve deficit. She exhibits normal muscle tone. Coordination normal.  02/25/15 MMSE 28/30. Failed clock drawing.  Skin: Skin is warm and dry. No rash noted. She is not diaphoretic. No  pallor.  Psychiatric: She has a normal mood and affect. Her behavior is normal. Judgment and thought content normal.    Labs reviewed:  Recent Labs  03/02/15 11/02/15  NA 143 141  K 4.1 4.5  BUN 17 15  CREATININE 0.9 0.7    Recent Labs  03/02/15 11/02/15  AST 17 28  ALT 9 26  ALKPHOS 86 158*    Recent Labs  03/02/15 11/02/15  WBC 4.7 4.5  HGB 13.3 11.9*  HCT 40 37  PLT 203 182   Lab Results  Component Value Date   TSH 1.46 11/02/2015   No results found for: HGBA1C Lab Results  Component Value Date   CHOL 168 08/10/2011   HDL 77.70 08/10/2011   LDLCALC 81 08/10/2011   TRIG 46.0 08/10/2011   CHOLHDL 2 08/10/2011    Significant Diagnostic Results in last 30 days:  No results found.  Assessment/Plan  HTN (hypertension) Blood pressure is controlled, takes Benazepril 20mg , Furosemide 20mg ,  Metoprolol 25mg  bid. 11/02/15 Na 141, K 4.5, Bun 15, creat 0.74     Atrial fibrillation (HCC) Heart rate is in control, continue Metoprolol.   CHF (congestive heart failure) (HCC) Compensated clinically, seen chronic ankle edema, continue Furosemide 20mg , monitor the patient weight closely. 11/02/15 Na 141, K 4.5, Bun 15, creat 0.74     GERD (gastroesophageal reflux disease) Stable.   Hypothyroidism Continue Levothyroxine 3mcg qod, 11/02/15 TSH 1.46   Edema Weight table, 11/02/15 Na 141, K 4.5, Bun 15, creat 0.74, continue Furosemide 20mg . 1+ ankle edema R+L  Anemia of chronic disease 11/02/15 Hgb 11.9      Family/ staff Communication: continue [facility] for care needs  Labs/tests ordered:  none

## 2016-01-27 NOTE — Assessment & Plan Note (Signed)
Weight table, 11/02/15 Na 141, K 4.5, Bun 15, creat 0.74, continue Furosemide 20mg . 1+ ankle edema R+L

## 2016-01-27 NOTE — Assessment & Plan Note (Signed)
Heart rate is in control, continue Metoprolol  

## 2016-01-27 NOTE — Assessment & Plan Note (Signed)
Blood pressure is controlled, takes Benazepril 20mg , Furosemide 20mg ,  Metoprolol 25mg  bid. 11/02/15 Na 141, K 4.5, Bun 15, creat 0.74

## 2016-01-27 NOTE — Assessment & Plan Note (Signed)
Continue Levothyroxine 42mcg qod, 11/02/15 TSH 1.46

## 2016-01-27 NOTE — Assessment & Plan Note (Signed)
11/02/15 Hgb 11.9

## 2016-02-10 ENCOUNTER — Emergency Department (HOSPITAL_COMMUNITY): Payer: Medicare Other

## 2016-02-10 ENCOUNTER — Other Ambulatory Visit: Payer: Self-pay

## 2016-02-10 ENCOUNTER — Inpatient Hospital Stay (HOSPITAL_COMMUNITY)
Admission: EM | Admit: 2016-02-10 | Discharge: 2016-02-19 | DRG: 444 | Disposition: E | Payer: Medicare Other | Attending: Internal Medicine | Admitting: Internal Medicine

## 2016-02-10 ENCOUNTER — Encounter (HOSPITAL_COMMUNITY): Payer: Self-pay | Admitting: Radiology

## 2016-02-10 DIAGNOSIS — K819 Cholecystitis, unspecified: Secondary | ICD-10-CM

## 2016-02-10 DIAGNOSIS — Z515 Encounter for palliative care: Secondary | ICD-10-CM | POA: Diagnosis present

## 2016-02-10 DIAGNOSIS — K8021 Calculus of gallbladder without cholecystitis with obstruction: Secondary | ICD-10-CM

## 2016-02-10 DIAGNOSIS — R6520 Severe sepsis without septic shock: Secondary | ICD-10-CM

## 2016-02-10 DIAGNOSIS — N390 Urinary tract infection, site not specified: Secondary | ICD-10-CM | POA: Diagnosis present

## 2016-02-10 DIAGNOSIS — I480 Paroxysmal atrial fibrillation: Secondary | ICD-10-CM

## 2016-02-10 DIAGNOSIS — Z90710 Acquired absence of both cervix and uterus: Secondary | ICD-10-CM

## 2016-02-10 DIAGNOSIS — I340 Nonrheumatic mitral (valve) insufficiency: Secondary | ICD-10-CM | POA: Diagnosis present

## 2016-02-10 DIAGNOSIS — Z881 Allergy status to other antibiotic agents status: Secondary | ICD-10-CM

## 2016-02-10 DIAGNOSIS — K8061 Calculus of gallbladder and bile duct with cholecystitis, unspecified, with obstruction: Principal | ICD-10-CM | POA: Diagnosis present

## 2016-02-10 DIAGNOSIS — N179 Acute kidney failure, unspecified: Secondary | ICD-10-CM

## 2016-02-10 DIAGNOSIS — I110 Hypertensive heart disease with heart failure: Secondary | ICD-10-CM | POA: Diagnosis present

## 2016-02-10 DIAGNOSIS — E039 Hypothyroidism, unspecified: Secondary | ICD-10-CM | POA: Diagnosis present

## 2016-02-10 DIAGNOSIS — R000 Tachycardia, unspecified: Secondary | ICD-10-CM | POA: Diagnosis present

## 2016-02-10 DIAGNOSIS — I341 Nonrheumatic mitral (valve) prolapse: Secondary | ICD-10-CM | POA: Diagnosis present

## 2016-02-10 DIAGNOSIS — Z681 Body mass index (BMI) 19 or less, adult: Secondary | ICD-10-CM

## 2016-02-10 DIAGNOSIS — I5032 Chronic diastolic (congestive) heart failure: Secondary | ICD-10-CM | POA: Diagnosis present

## 2016-02-10 DIAGNOSIS — E872 Acidosis, unspecified: Secondary | ICD-10-CM

## 2016-02-10 DIAGNOSIS — Z7901 Long term (current) use of anticoagulants: Secondary | ICD-10-CM | POA: Diagnosis not present

## 2016-02-10 DIAGNOSIS — Z823 Family history of stroke: Secondary | ICD-10-CM

## 2016-02-10 DIAGNOSIS — I4581 Long QT syndrome: Secondary | ICD-10-CM | POA: Diagnosis present

## 2016-02-10 DIAGNOSIS — Z66 Do not resuscitate: Secondary | ICD-10-CM | POA: Diagnosis present

## 2016-02-10 DIAGNOSIS — I4891 Unspecified atrial fibrillation: Secondary | ICD-10-CM | POA: Diagnosis present

## 2016-02-10 DIAGNOSIS — K219 Gastro-esophageal reflux disease without esophagitis: Secondary | ICD-10-CM | POA: Diagnosis present

## 2016-02-10 DIAGNOSIS — R791 Abnormal coagulation profile: Secondary | ICD-10-CM | POA: Diagnosis present

## 2016-02-10 DIAGNOSIS — E43 Unspecified severe protein-calorie malnutrition: Secondary | ICD-10-CM

## 2016-02-10 DIAGNOSIS — I509 Heart failure, unspecified: Secondary | ICD-10-CM

## 2016-02-10 DIAGNOSIS — R740 Nonspecific elevation of levels of transaminase and lactic acid dehydrogenase [LDH]: Secondary | ICD-10-CM

## 2016-02-10 DIAGNOSIS — K8051 Calculus of bile duct without cholangitis or cholecystitis with obstruction: Secondary | ICD-10-CM

## 2016-02-10 DIAGNOSIS — R188 Other ascites: Secondary | ICD-10-CM | POA: Diagnosis present

## 2016-02-10 DIAGNOSIS — R748 Abnormal levels of other serum enzymes: Secondary | ICD-10-CM | POA: Diagnosis present

## 2016-02-10 DIAGNOSIS — Z820 Family history of epilepsy and other diseases of the nervous system: Secondary | ICD-10-CM | POA: Diagnosis not present

## 2016-02-10 DIAGNOSIS — K7460 Unspecified cirrhosis of liver: Secondary | ICD-10-CM | POA: Diagnosis present

## 2016-02-10 DIAGNOSIS — A419 Sepsis, unspecified organism: Secondary | ICD-10-CM

## 2016-02-10 DIAGNOSIS — R109 Unspecified abdominal pain: Secondary | ICD-10-CM | POA: Diagnosis not present

## 2016-02-10 LAB — CBC WITH DIFFERENTIAL/PLATELET
BASOS ABS: 0 10*3/uL (ref 0.0–0.1)
Basophils Relative: 0 %
EOS ABS: 0 10*3/uL (ref 0.0–0.7)
Eosinophils Relative: 0 %
HEMATOCRIT: 44.7 % (ref 36.0–46.0)
HEMOGLOBIN: 15.6 g/dL — AB (ref 12.0–15.0)
LYMPHS ABS: 0.6 10*3/uL — AB (ref 0.7–4.0)
Lymphocytes Relative: 7 %
MCH: 31 pg (ref 26.0–34.0)
MCHC: 34.9 g/dL (ref 30.0–36.0)
MCV: 88.7 fL (ref 78.0–100.0)
MONO ABS: 0.4 10*3/uL (ref 0.1–1.0)
Monocytes Relative: 4 %
Neutro Abs: 7.8 10*3/uL — ABNORMAL HIGH (ref 1.7–7.7)
Neutrophils Relative %: 89 %
Platelets: 206 10*3/uL (ref 150–400)
RBC: 5.04 MIL/uL (ref 3.87–5.11)
RDW: 16.6 % — AB (ref 11.5–15.5)
WBC: 8.8 10*3/uL (ref 4.0–10.5)

## 2016-02-10 LAB — COMPREHENSIVE METABOLIC PANEL
ALK PHOS: 377 U/L — AB (ref 38–126)
ALT: 93 U/L — ABNORMAL HIGH (ref 14–54)
ANION GAP: 12 (ref 5–15)
AST: 84 U/L — ABNORMAL HIGH (ref 15–41)
Albumin: 3.3 g/dL — ABNORMAL LOW (ref 3.5–5.0)
BUN: 25 mg/dL — ABNORMAL HIGH (ref 6–20)
CALCIUM: 9.3 mg/dL (ref 8.9–10.3)
CO2: 23 mmol/L (ref 22–32)
Chloride: 103 mmol/L (ref 101–111)
Creatinine, Ser: 1.47 mg/dL — ABNORMAL HIGH (ref 0.44–1.00)
GFR calc non Af Amer: 28 mL/min — ABNORMAL LOW (ref >60)
GFR, EST AFRICAN AMERICAN: 32 mL/min — AB (ref >60)
Glucose, Bld: 83 mg/dL (ref 65–99)
Potassium: 4.7 mmol/L (ref 3.5–5.1)
SODIUM: 138 mmol/L (ref 135–145)
TOTAL PROTEIN: 7 g/dL (ref 6.5–8.1)
Total Bilirubin: 8.5 mg/dL — ABNORMAL HIGH (ref 0.3–1.2)

## 2016-02-10 LAB — URINALYSIS, ROUTINE W REFLEX MICROSCOPIC
GLUCOSE, UA: NEGATIVE mg/dL
KETONES UR: NEGATIVE mg/dL
Nitrite: POSITIVE — AB
PH: 5 (ref 5.0–8.0)
Protein, ur: NEGATIVE mg/dL
SPECIFIC GRAVITY, URINE: 1.039 — AB (ref 1.005–1.030)

## 2016-02-10 LAB — I-STAT CHEM 8, ED
BUN: 23 mg/dL — AB (ref 6–20)
CALCIUM ION: 1.11 mmol/L — AB (ref 1.13–1.30)
CHLORIDE: 104 mmol/L (ref 101–111)
Creatinine, Ser: 1.5 mg/dL — ABNORMAL HIGH (ref 0.44–1.00)
GLUCOSE: 79 mg/dL (ref 65–99)
HCT: 50 % — ABNORMAL HIGH (ref 36.0–46.0)
Hemoglobin: 17 g/dL — ABNORMAL HIGH (ref 12.0–15.0)
POTASSIUM: 4.5 mmol/L (ref 3.5–5.1)
SODIUM: 138 mmol/L (ref 135–145)
TCO2: 25 mmol/L (ref 0–100)

## 2016-02-10 LAB — I-STAT CG4 LACTIC ACID, ED: Lactic Acid, Venous: 3.66 mmol/L (ref 0.5–2.0)

## 2016-02-10 LAB — URINE MICROSCOPIC-ADD ON

## 2016-02-10 LAB — PROTIME-INR
INR: 6.02 (ref 0.00–1.49)
PROTHROMBIN TIME: 51.7 s — AB (ref 11.6–15.2)

## 2016-02-10 LAB — AMMONIA: AMMONIA: 21 umol/L (ref 9–35)

## 2016-02-10 LAB — TROPONIN I
TROPONIN I: 0.05 ng/mL — AB (ref <0.031)
TROPONIN I: 0.04 ng/mL — AB (ref <0.031)

## 2016-02-10 LAB — LACTIC ACID, PLASMA
LACTIC ACID, VENOUS: 2.2 mmol/L — AB (ref 0.5–2.0)
Lactic Acid, Venous: 2.2 mmol/L (ref 0.5–2.0)

## 2016-02-10 LAB — MRSA PCR SCREENING: MRSA BY PCR: NEGATIVE

## 2016-02-10 LAB — LIPASE, BLOOD: Lipase: 16 U/L (ref 11–51)

## 2016-02-10 LAB — POC OCCULT BLOOD, ED: Fecal Occult Bld: NEGATIVE

## 2016-02-10 MED ORDER — POLYVINYL ALCOHOL 1.4 % OP SOLN
1.0000 [drp] | Freq: Four times a day (QID) | OPHTHALMIC | Status: DC
  Administered 2016-02-11 – 2016-02-12 (×5): 1 [drp] via OPHTHALMIC
  Filled 2016-02-10: qty 15

## 2016-02-10 MED ORDER — PANTOPRAZOLE SODIUM 40 MG IV SOLR
40.0000 mg | Freq: Two times a day (BID) | INTRAVENOUS | Status: DC
Start: 2016-02-11 — End: 2016-02-12
  Administered 2016-02-11 – 2016-02-12 (×3): 40 mg via INTRAVENOUS
  Filled 2016-02-10 (×3): qty 40

## 2016-02-10 MED ORDER — METOPROLOL TARTRATE 5 MG/5ML IV SOLN: 5.0000 mg | Freq: Three times a day (TID) | INTRAVENOUS | Status: DC | PRN

## 2016-02-10 MED ORDER — SODIUM CHLORIDE 0.9 % IV BOLUS (SEPSIS)
1000.0000 mL | Freq: Once | INTRAVENOUS | Status: AC
  Administered 2016-02-10: 1000 mL via INTRAVENOUS

## 2016-02-10 MED ORDER — METRONIDAZOLE IN NACL 5-0.79 MG/ML-% IV SOLN
500.0000 mg | Freq: Three times a day (TID) | INTRAVENOUS | Status: DC
  Administered 2016-02-10 – 2016-02-12 (×5): 500 mg via INTRAVENOUS
  Filled 2016-02-10 (×5): qty 100

## 2016-02-10 MED ORDER — OXYCODONE HCL 5 MG PO TABS: 5.0000 mg | ORAL_TABLET | ORAL | Status: DC | PRN

## 2016-02-10 MED ORDER — SODIUM CHLORIDE 0.9% FLUSH
3.0000 mL | Freq: Two times a day (BID) | INTRAVENOUS | Status: DC
  Administered 2016-02-10 – 2016-02-11 (×3): 3 mL via INTRAVENOUS

## 2016-02-10 MED ORDER — VANCOMYCIN HCL 500 MG IV SOLR
500.0000 mg | INTRAVENOUS | Status: DC
  Filled 2016-02-10: qty 500

## 2016-02-10 MED ORDER — FAMOTIDINE IN NACL 20-0.9 MG/50ML-% IV SOLN: 20.0000 mg | Freq: Two times a day (BID) | INTRAVENOUS | Status: DC

## 2016-02-10 MED ORDER — DEXTROSE 5 % IV SOLN
500.0000 mg | Freq: Three times a day (TID) | INTRAVENOUS | Status: DC
  Administered 2016-02-11 – 2016-02-12 (×5): 500 mg via INTRAVENOUS
  Filled 2016-02-10 (×10): qty 0.5

## 2016-02-10 MED ORDER — DEXTROSE 5 % IV SOLN
1.0000 g | INTRAVENOUS | Status: AC
  Administered 2016-02-10: 1 g via INTRAVENOUS
  Filled 2016-02-10: qty 1

## 2016-02-10 MED ORDER — SODIUM CHLORIDE 0.9 % IV SOLN
INTRAVENOUS | Status: DC
  Administered 2016-02-10 – 2016-02-11 (×2): via INTRAVENOUS

## 2016-02-10 MED ORDER — VANCOMYCIN HCL IN DEXTROSE 1-5 GM/200ML-% IV SOLN
1000.0000 mg | INTRAVENOUS | Status: AC
Start: 2016-02-10 — End: 2016-02-10
  Administered 2016-02-10: 1000 mg via INTRAVENOUS
  Filled 2016-02-10: qty 200

## 2016-02-10 MED ORDER — METRONIDAZOLE IN NACL 5-0.79 MG/ML-% IV SOLN
500.0000 mg | Freq: Once | INTRAVENOUS | Status: AC
Start: 2016-02-10 — End: 2016-02-10
  Administered 2016-02-10: 500 mg via INTRAVENOUS
  Filled 2016-02-10: qty 100

## 2016-02-10 MED ORDER — LEVOTHYROXINE SODIUM 25 MCG PO TABS
25.0000 ug | ORAL_TABLET | ORAL | Status: DC
  Filled 2016-02-10: qty 1

## 2016-02-10 MED ORDER — IOPAMIDOL (ISOVUE-300) INJECTION 61%
75.0000 mL | Freq: Once | INTRAVENOUS | Status: AC | PRN
  Administered 2016-02-10: 50 mL via INTRAVENOUS

## 2016-02-10 MED ORDER — AZTREONAM 1 G IJ SOLR
500.0000 mg | Freq: Three times a day (TID) | INTRAMUSCULAR | Status: DC
  Filled 2016-02-10: qty 0.5

## 2016-02-10 MED ORDER — ONDANSETRON HCL 4 MG/2ML IJ SOLN: 4.0000 mg | Freq: Four times a day (QID) | INTRAMUSCULAR | Status: DC | PRN

## 2016-02-10 MED ORDER — MORPHINE SULFATE (PF) 2 MG/ML IV SOLN
2.0000 mg | Freq: Once | INTRAVENOUS | Status: AC
  Administered 2016-02-10: 2 mg via INTRAVENOUS
  Filled 2016-02-10: qty 1

## 2016-02-10 MED ORDER — FENTANYL CITRATE (PF) 100 MCG/2ML IJ SOLN
12.5000 ug | INTRAMUSCULAR | Status: DC | PRN
  Administered 2016-02-10: 12.5 ug via INTRAVENOUS
  Administered 2016-02-11: 25 ug via INTRAVENOUS
  Administered 2016-02-11: 12.5 ug via INTRAVENOUS
  Administered 2016-02-11 (×4): 25 ug via INTRAVENOUS
  Filled 2016-02-10 (×7): qty 2

## 2016-02-10 MED ORDER — PANTOPRAZOLE SODIUM 40 MG IV SOLR
40.0000 mg | Freq: Once | INTRAVENOUS | Status: AC
  Administered 2016-02-10: 40 mg via INTRAVENOUS

## 2016-02-10 MED ORDER — DEXTROSE 5 % IV SOLN
500.0000 mg | Freq: Three times a day (TID) | INTRAVENOUS | Status: DC
  Filled 2016-02-10: qty 0.5

## 2016-02-10 MED ORDER — DEXTROSE 5 % IV SOLN
5.0000 mg | Freq: Once | INTRAVENOUS | Status: AC
  Administered 2016-02-10: 5 mg via INTRAVENOUS
  Filled 2016-02-10: qty 0.5

## 2016-02-10 MED ORDER — SODIUM CHLORIDE 0.9 % IV BOLUS (SEPSIS)
500.0000 mL | Freq: Once | INTRAVENOUS | Status: AC
  Administered 2016-02-10: 500 mL via INTRAVENOUS

## 2016-02-10 MED ORDER — ONDANSETRON HCL 4 MG PO TABS: 4.0000 mg | ORAL_TABLET | Freq: Four times a day (QID) | ORAL | Status: DC | PRN

## 2016-02-10 NOTE — Consult Note (Signed)
Rose Weber is an 82 y.o. female.  HPI: 82 yo female with 3 days of abdominal pain. It has been constant. She has not had fevers. She is in an [employer] and at baseline is able to listen to books on tape, eat in the dining area, and interact with friends. On coming to the ER she was found to have a hyperbilirubinemia  Past Medical History  Diagnosis Date  . HTN (hypertension)   . MVP (mitral valve prolapse)   . Mitral insufficiency     SEVERE  . Chronic anticoagulation   . Edema   . GERD (gastroesophageal reflux disease)   . Personal history of goiter   . Hypothyroidism   . CHF (congestive heart failure) ([place])   . Mitral valve prolapse     With severe mitral insufficiency  . Atrial fibrillation ([place])   . Goiter     History of goiter  . PNA (pneumonia)     strep  5/14  . Other malaise and fatigue 01/25/2012  . Visual disturbances 10/12/2010  . Benign neoplasm of colon 04/09/2003  . Cardiac dysrhythmia, unspecified 09/26/2000  . Other corneal degenerations 02/19/1986    Fuchs corneal dystrophy    Past Surgical History  Procedure Laterality Date  . Cardioversion  04/14/10  . Appendectomy  1932  . Small intestine surgery      SBO 2000  . Abdominal hysterectomy  1962  . Corneal transplant      MULTIPLE  . Rectal polypectomy    . Eye surgery Left 07/2013    replaced valve in eye    Family History  Problem Relation Age of Onset  . Hip fracture Sister   . Stroke Father   . Alzheimer's disease Brother   . Stroke Sister     Social History:  reports that she has never smoked. She has never used smokeless tobacco. She reports that she does not drink alcohol or use illicit drugs.  Allergies:  Allergies  Allergen Reactions  . Diphenhydramine Hcl Shortness Of Breath  . Alphagan [Brimonidine Tartrate]   . Amlodipine Other (See Comments)    Excess edema  . Avelox [Moxifloxacin Hcl In Nacl]   . Benadryl [Diphenhydramine]   . Benzalkonium Other (See Comments)  . Brimonidine  Other (See Comments)    Other Reaction: increased HR and SOB  . Bystolic [Nebivolol Hcl]   . Combigan [Brimonidine Tartrate-Timolol]   . Difluprednate Other (See Comments)    Other Reaction: increased HR, and SOB  . Diltiazem   . Diovan [Valsartan]   . Erythromycin   . Estradiol Other (See Comments)  . Fluorometholone Other (See Comments)  . Nebivolol Other (See Comments)  . Other     PRESERVATIVES  . Penicillins     Unknown reaction per [facility]  Has patient had a PCN reaction causing immediate rash, facial/tongue/throat swelling, SOB or lightheadedness with hypotension: Unknown Has patient had a PCN reaction causing severe rash involving mucus membranes or skin necrosis: Unknown Has patient had a PCN reaction that required hospitalization: Unknown Has patient had a PCN reaction occurring within the last 10 years: Unknown If all of the above answers are "NO", then may proceed with Cepha  . Polymyxin B-Trimethoprim   . Restasis [Cyclosporine]   . Sulfa Drugs Cross Reactors   . Tekturna [Aliskiren Fumarate]   . Travatan Z   . Xalatan [Latanoprost]   . Lactase Other (See Comments)    Other Reaction: GI Upset    Medications: I  have reviewed the patient's current medications.  Results for orders placed or performed during the hospital encounter of 01/22/2016 (from the past 48 hour(s))  POC occult blood, ED RN will collect     Status: None   Collection Time: 02/02/2016 12:03 PM  Result Value Ref Range   Fecal Occult Bld NEGATIVE NEGATIVE  Comprehensive metabolic panel     Status: Abnormal   Collection Time: 02/06/2016 12:06 PM  Result Value Ref Range   Sodium 138 135 - 145 mmol/L   Potassium 4.7 3.5 - 5.1 mmol/L   Chloride 103 101 - 111 mmol/L   CO2 23 22 - 32 mmol/L   Glucose, Bld 83 65 - 99 mg/dL   BUN 25 (H) 6 - 20 mg/dL   Creatinine, Ser 1.47 (H) 0.44 - 1.00 mg/dL   Calcium 9.3 8.9 - 10.3 mg/dL   Total Protein 7.0 6.5 - 8.1 g/dL   Albumin 3.3 (L) 3.5 - 5.0 g/dL   AST 84 (H) 15  - 41 U/L   ALT 93 (H) 14 - 54 U/L   Alkaline Phosphatase 377 (H) 38 - 126 U/L   Total Bilirubin 8.5 (H) 0.3 - 1.2 mg/dL   GFR calc non Af Amer 28 (L) >60 mL/min   GFR calc Af Amer 32 (L) >60 mL/min    Comment: (NOTE) The eGFR has been calculated using the CKD EPI equation. This calculation has not been validated in all clinical situations. eGFR's persistently <60 mL/min signify possible Chronic Kidney Disease.    Anion gap 12 5 - 15  Lipase, blood     Status: None   Collection Time: 02/09/2016 12:06 PM  Result Value Ref Range   Lipase 16 11 - 51 U/L  Troponin I     Status: Abnormal   Collection Time: 02/07/2016 12:06 PM  Result Value Ref Range   Troponin I 0.05 (H) <0.031 ng/mL    Comment:        PERSISTENTLY INCREASED TROPONIN VALUES IN THE RANGE OF 0.04-0.49 ng/mL CAN BE SEEN IN:       -UNSTABLE ANGINA       -CONGESTIVE HEART FAILURE       -MYOCARDITIS       -CHEST TRAUMA       -ARRYHTHMIAS       -LATE PRESENTING MYOCARDIAL INFARCTION       -COPD   CLINICAL FOLLOW-UP RECOMMENDED.   CBC with Differential     Status: Abnormal   Collection Time: 02/03/2016 12:06 PM  Result Value Ref Range   WBC 8.8 4.0 - 10.5 K/uL   RBC 5.04 3.87 - 5.11 MIL/uL   Hemoglobin 15.6 (H) 12.0 - 15.0 g/dL   HCT 44.7 36.0 - 46.0 %   MCV 88.7 78.0 - 100.0 fL   MCH 31.0 26.0 - 34.0 pg   MCHC 34.9 30.0 - 36.0 g/dL   RDW 16.6 (H) 11.5 - 15.5 %   Platelets 206 150 - 400 K/uL   Neutrophils Relative % 89 %   Lymphocytes Relative 7 %   Monocytes Relative 4 %   Eosinophils Relative 0 %   Basophils Relative 0 %   Neutro Abs 7.8 (H) 1.7 - 7.7 K/uL   Lymphs Abs 0.6 (L) 0.7 - 4.0 K/uL   Monocytes Absolute 0.4 0.1 - 1.0 K/uL   Eosinophils Absolute 0.0 0.0 - 0.7 K/uL   Basophils Absolute 0.0 0.0 - 0.1 K/uL   WBC Morphology MILD LEFT SHIFT (1-5% METAS, OCC MYELO, OCC BANDS)  Comment: TOXIC GRANULATION VACUOLATED NEUTROPHILS   Protime-INR     Status: Abnormal   Collection Time: 01/25/2016 12:06 PM   Result Value Ref Range   Prothrombin Time 51.7 (H) 11.6 - 15.2 seconds   INR 6.02 (HH) 0.00 - 1.49    Comment: CRITICAL RESULT CALLED TO, READ BACK BY AND VERIFIED WITH: [employer] RN AT 1250 ON 06.22.17 BY SHUEA   I-stat Chem 8, ED     Status: Abnormal   Collection Time: 02/18/2016 12:12 PM  Result Value Ref Range   Sodium 138 135 - 145 mmol/L   Potassium 4.5 3.5 - 5.1 mmol/L   Chloride 104 101 - 111 mmol/L   BUN 23 (H) 6 - 20 mg/dL   Creatinine, Ser 1.50 (H) 0.44 - 1.00 mg/dL   Glucose, Bld 79 65 - 99 mg/dL   Calcium, Ion 1.11 (L) 1.13 - 1.30 mmol/L   TCO2 25 0 - 100 mmol/L   Hemoglobin 17.0 (H) 12.0 - 15.0 g/dL   HCT 50.0 (H) 36.0 - 46.0 %  I-Stat CG4 Lactic Acid, ED     Status: Abnormal   Collection Time: 02/11/2016 12:13 PM  Result Value Ref Range   Lactic Acid, Venous 3.66 (HH) 0.5 - 2.0 mmol/L   Comment NOTIFIED PHYSICIAN   Urinalysis, Routine w reflex microscopic     Status: Abnormal   Collection Time: 02/16/2016  3:20 PM  Result Value Ref Range   Color, Urine AMBER (A) YELLOW    Comment: BIOCHEMICALS MAY BE AFFECTED BY COLOR   APPearance TURBID (A) CLEAR   Specific Gravity, Urine 1.039 (H) 1.005 - 1.030   pH 5.0 5.0 - 8.0   Glucose, UA NEGATIVE NEGATIVE mg/dL   Hgb urine dipstick SMALL (A) NEGATIVE   Bilirubin Urine LARGE (A) NEGATIVE   Ketones, ur NEGATIVE NEGATIVE mg/dL   Protein, ur NEGATIVE NEGATIVE mg/dL   Nitrite POSITIVE (A) NEGATIVE   Leukocytes, UA SMALL (A) NEGATIVE  Urine microscopic-add on     Status: Abnormal   Collection Time: 02/02/2016  3:20 PM  Result Value Ref Range   Squamous Epithelial / LPF 6-30 (A) NONE SEEN   WBC, UA TOO NUMEROUS TO COUNT 0 - 5 WBC/hpf   RBC / HPF 0-5 0 - 5 RBC/hpf   Bacteria, UA MANY (A) NONE SEEN   Casts HYALINE CASTS (A) NEGATIVE   Crystals CA OXALATE CRYSTALS (A) NEGATIVE    Ct Abdomen Pelvis W Contrast  01/30/2016  CLINICAL DATA:  Diffuse abdominal pain EXAM: CT ABDOMEN AND PELVIS WITH CONTRAST TECHNIQUE: Multidetector CT  imaging of the abdomen and pelvis was performed using the standard protocol following bolus administration of intravenous contrast. CONTRAST:  27m ISOVUE-300 IOPAMIDOL (ISOVUE-300) INJECTION 61% COMPARISON:  None. FINDINGS: Lower chest: Moderate right pleural effusion. Bibasilar atelectasis. Marked cardiac enlargement. Coronary calcification. Hepatobiliary: Marked intrahepatic and extrahepatic biliary dilatation. Common bile duct 17 mm. Large round soft tissue density in the common bile duct compatible with common duct stone. Gallbladder markedly dilated without gallbladder wall thickening. Several small gallstones in the gallbladder. Small liver. Liver contour is irregular. Left lobe liver is enlarged compared to the right. Possible cirrhosis. Pancreas: Atrophic pancreas. Pancreatic duct dilatation. No pancreatic mass or pancreatitis. Spleen: Negative Adrenals/Urinary Tract: Both kidneys normal in size. No obstruction or mass in the kidneys. Stomach/Bowel: Negative for bowel obstruction. No bowel mass or edema. Vascular/Lymphatic: Atherosclerotic calcification in the aorta. No aneurysm. No lymphadenopathy. Reproductive: Hysterectomy.  No pelvic mass. Other: Moderate free fluid in the pelvis.  Moderate amount of free fluid around the liver Musculoskeletal: Lumbar dextroscoliosis. Disc degeneration in the lumbar spine most notably L2-3. No fracture. No acute skeletal lesion. IMPRESSION: Marked biliary duct dilatation. Common bile duct 17 mm. Common bile duct stone. Marked gallbladder dilatation without thickening. Cholelithiasis. Probable cirrhosis of liver with moderate ascites. Marked cardiac enlargement with right pleural effusion Atherosclerotic disease in the aorta and coronary arteries. Electronically Signed   By: Franchot Gallo M.D.   On: 02/09/2016 14:07   Dg Chest Portable 1 View  02/01/2016  CLINICAL DATA:  Abdominal pain, possible free abdominal air EXAM: PORTABLE CHEST 1 VIEW COMPARISON:  01/11/2013  FINDINGS: Cardiomegaly again noted. There is small right pleural effusion with streaky right basilar atelectasis or infiltrate. Mild left basilar atelectasis. Atherosclerotic calcifications of thoracic aorta. Mild levoscoliosis lower thoracic spine. There is no definite evidence of free abdominal air. Moderate gas noted within proximal stomach. IMPRESSION: There is small right pleural effusion with streaky right basilar atelectasis or infiltrate. Mild left basilar atelectasis. Atherosclerotic calcifications of thoracic aorta. Mild levoscoliosis lower thoracic spine. There is no definite evidence of free abdominal air. Moderate gas within proximal stomach. Electronically Signed   By: Lahoma Crocker M.D.   On: 02/18/2016 12:36    Review of Systems  Constitutional: Negative for fever and chills.  HENT: Negative for hearing loss.   Eyes: Negative for blurred vision and double vision.  Respiratory: Negative for cough and hemoptysis.   Cardiovascular: Negative for chest pain and palpitations.  Gastrointestinal: Positive for abdominal pain. Negative for nausea and vomiting.  Genitourinary: Negative for dysuria and urgency.  Musculoskeletal: Negative for myalgias and neck pain.  Skin: Negative for itching and rash.  Neurological: Negative for dizziness, tingling and headaches.  Endo/Heme/Allergies: Does not bruise/bleed easily.  Psychiatric/Behavioral: Negative for depression and suicidal ideas.   Blood pressure 126/90, pulse 126, temperature 97.4 F (36.3 C), temperature source Rectal, resp. rate 25, SpO2 100 %. Physical Exam  Vitals reviewed. Constitutional: She appears well-developed.  cachetic  HENT:  Head: Normocephalic and atraumatic.  Hard of hearing  Eyes: EOM are normal. Pupils are equal, round, and reactive to light. Scleral icterus is present.  Neck: Normal range of motion. Neck supple.  Cardiovascular:  tachycardic  Respiratory: Effort normal and breath sounds normal.  GI: Soft. Bowel  sounds are normal. She exhibits no distension. There is tenderness in the right upper quadrant.  Musculoskeletal: Normal range of motion.  Neurological: She is alert.  Hard of hearing but is able to state she is in pain  Skin: Skin is warm and dry.  Psychiatric: She has a normal mood and affect. Her behavior is normal.    Assessment/Plan: 82 yo female with concern for obstructive jaundice and finding of stone in CBD on CT scan. Fortunately, she has no leukocytosis and is able to communicate with clinicians and staff appropriately. -I discussed general management of obstructive jaundice with the patient and friend. At this time we will follow up plan from GI.  -will continue to follow  Arta Bruce Thecla Forgione 02/11/2016, 4:50 PM

## 2016-02-10 NOTE — H&P (Signed)
History and Physical    Rose Weber SWN:[id number] DOB: November 29, 1912 DOA: 01/23/2016  PCP: Rose Dooms, MD Patient coming from:   Chief Complaint:   HPI: Rose Weber is a 82 y.o. female with medical history significant of Afib on coumadin, HTN, Mitral valve prolapse, Chronic diastolic dysfunction who presents with abdominal pain that started day prior to admission. Patient only able to provide limited history due to AMS. She point pain mid abdomen, started yesterday. She denies chest pain. She has not been eating well.    ED Course: transaminases, Alk phosphatase 377, bili at 8.5, troponin 0.05, lactic acid 3.3, CT abdomen pelvis: Marked biliary duct dilatation. Common bile duct 17 mm. Common bile duct stone. Marked gallbladder dilatation without thickening. Cholelithiasis.  Probable cirrhosis of liver with moderate ascites.    Review of Systems: As per HPI otherwise 10 point review of systems negative.    Past Medical History  Diagnosis Date  . HTN (hypertension)   . MVP (mitral valve prolapse)   . Mitral insufficiency     SEVERE  . Chronic anticoagulation   . Edema   . GERD (gastroesophageal reflux disease)   . Personal history of goiter   . Hypothyroidism   . CHF (congestive heart failure) ([place])   . Mitral valve prolapse     With severe mitral insufficiency  . Atrial fibrillation ([place])   . Goiter     History of goiter  . PNA (pneumonia)     strep  5/14  . Other malaise and fatigue 01/25/2012  . Visual disturbances 10/12/2010  . Benign neoplasm of colon 04/09/2003  . Cardiac dysrhythmia, unspecified 09/26/2000  . Other corneal degenerations 02/19/1986    Fuchs corneal dystrophy    Past Surgical History  Procedure Laterality Date  . Cardioversion  04/14/10  . Appendectomy  1932  . Small intestine surgery      SBO 2000  . Abdominal hysterectomy  1962  . Corneal transplant      MULTIPLE  . Rectal polypectomy    . Eye surgery Left 07/2013    replaced valve in  eye     reports that she has never smoked. She has never used smokeless tobacco. She reports that she does not drink alcohol or use illicit drugs.  Allergies  Allergen Reactions  . Diphenhydramine Hcl Shortness Of Breath  . Alphagan [Brimonidine Tartrate]   . Amlodipine Other (See Comments)    Excess edema  . Avelox [Moxifloxacin Hcl In Nacl]   . Benadryl [Diphenhydramine]   . Benzalkonium Other (See Comments)  . Brimonidine Other (See Comments)    Other Reaction: increased HR and SOB  . Bystolic [Nebivolol Hcl]   . Combigan [Brimonidine Tartrate-Timolol]   . Difluprednate Other (See Comments)    Other Reaction: increased HR, and SOB  . Diltiazem   . Diovan [Valsartan]   . Erythromycin   . Estradiol Other (See Comments)  . Fluorometholone Other (See Comments)  . Nebivolol Other (See Comments)  . Other     PRESERVATIVES  . Penicillins     Unknown reaction per [facility]  Has patient had a PCN reaction causing immediate rash, facial/tongue/throat swelling, SOB or lightheadedness with hypotension: Unknown Has patient had a PCN reaction causing severe rash involving mucus membranes or skin necrosis: Unknown Has patient had a PCN reaction that required hospitalization: Unknown Has patient had a PCN reaction occurring within the last 10 years: Unknown If all of the above answers are "NO", then may proceed  with Cepha  . Polymyxin B-Trimethoprim   . Restasis [Cyclosporine]   . Sulfa Drugs Cross Reactors   . Tekturna [Aliskiren Fumarate]   . Travatan Z   . Xalatan [Latanoprost]   . Lactase Other (See Comments)    Other Reaction: GI Upset    Family History  Problem Relation Age of Onset  . Hip fracture Sister   . Stroke Father   . Alzheimer's disease Brother   . Stroke Sister      Prior to Admission medications   Medication Sig Start Date End Date Taking? Authorizing Provider  acetaminophen (TYLENOL) 650 MG CR tablet Take 650 mg by mouth every 8 (eight) hours as needed for  pain.   Yes Historical Provider, MD  alum & mag hydroxide-simeth (MAALOX/MYLANTA) 200-200-20 MG/5ML suspension Take 30 mLs by mouth every 4 (four) hours as needed for indigestion or heartburn.   Yes Historical Provider, MD  bacitracin ophthalmic ointment Place 1 application into both eyes at bedtime.    Yes Historical Provider, MD  benazepril (LOTENSIN) 20 MG tablet Take 1 tablet each morning to control blood pressure and strengthen the heart Patient taking differently: Take 20 mg by mouth daily. Take 1 tablet each morning to control blood pressure and strengthen the heart 11/28/15  Yes Rose Dooms, MD  carboxymethylcellulose (REFRESH PLUS) 0.5 % SOLN Instill 1 drop into both eyes four times daily   Yes Historical Provider, MD  furosemide (LASIX) 20 MG tablet Take one tablet each morning to control fluid retention Patient taking differently: Take 20 mg by mouth daily.  11/28/15  Yes Rose Dooms, MD  levothyroxine (SYNTHROID, LEVOTHROID) 25 MCG tablet Take 25 mcg by mouth every other day.    Yes Historical Provider, MD  metoprolol tartrate (LOPRESSOR) 25 MG tablet One twice daily to control blood pressure Patient taking differently: Take 25 mg by mouth 2 (two) times daily.  02/25/15  Yes Rose Dooms, MD  polyvinyl alcohol (LIQUIFILM TEARS) 1.4 % ophthalmic solution Place 1 drop into both eyes QID.   Yes Historical Provider, MD  potassium chloride SA (K-DUR,KLOR-CON) 20 MEQ tablet Take 20 meq each day for potassium supplement Patient taking differently: Take 20 mEq by mouth daily.  11/28/15  Yes Rose Dooms, MD  PRESCRIPTION MEDICATION Place 1 drop into both eyes 2 (two) times daily. *Cyclosporin 2% Opth Soln* - Gets from [facility], keep refrigerated   Yes Historical Provider, MD  traMADol (ULTRAM) 50 MG tablet Take 50 mg by mouth every 6 (six) hours as needed for moderate pain.   Yes Historical Provider, MD  warfarin (COUMADIN) 3 MG tablet Take 3 mg by mouth daily at 6 PM.   Yes  Historical Provider, MD    Physical Exam:l lethargic, in pain  Filed Vitals:   02/08/2016 1300 02/09/2016 1430 02/07/2016 1506 02/14/2016 1510  BP: 107/68 112/64 126/90 126/90  Pulse: 63   126  Temp:      TempSrc:      Resp: 22 18 19 25   SpO2: 81%   100%      Constitutional: NAD, calm, comfortable Filed Vitals:   02/05/2016 1300 02/06/2016 1430 02/07/2016 1506 01/26/2016 1510  BP: 107/68 112/64 126/90 126/90  Pulse: 63   126  Temp:      TempSrc:      Resp: 22 18 19 25   SpO2: 81%   100%   Eyes: PERRL, lids and conjunctivae normal ENMT: Mucous membranes are dry .  Posterior pharynx clear  of any exudate or lesions. Neck: normal, supple, no masses, no thyromegaly Respiratory: clear to auscultation bilaterally, no wheezing, no crackles. Normal respiratory effort. No accessory muscle use.  Cardiovascular: Regular rate and rhythm, no murmurs / rubs / gallops. Plus 2  extremity edema. 2+ pedal pulses. No carotid bruits.  Abdomen: no tenderness, no masses palpated. No hepatosplenomegaly. Bowel sounds positive.  Musculoskeletal: no clubbing / cyanosis. No joint deformity upper and lower extremities. Good ROM, no contractures. Normal muscle tone.  Skin: mild redness right LE> Neurologic: lethargic,movesexteremities passively  Psychiatric: unable to assess   Labs on Admission: I have personally reviewed following labs and imaging studies  CBC:  Recent Labs Lab 02/05/2016 1206 01/27/2016 1212  WBC 8.8  --   NEUTROABS 7.8*  --   HGB 15.6* 17.0*  HCT 44.7 50.0*  MCV 88.7  --   PLT 206  --    Basic Metabolic Panel:  Recent Labs Lab 02/04/2016 1206 01/21/2016 1212  NA 138 138  K 4.7 4.5  CL 103 104  CO2 23  --   GLUCOSE 83 79  BUN 25* 23*  CREATININE 1.47* 1.50*  CALCIUM 9.3  --    GFR: CrCl cannot be calculated (Unknown ideal weight.). Liver Function Tests:  Recent Labs Lab 02/09/2016 1206  AST 84*  ALT 93*  ALKPHOS 377*  BILITOT 8.5*  PROT 7.0  ALBUMIN 3.3*    Recent  Labs Lab 01/24/2016 1206  LIPASE 16   No results for input(s): AMMONIA in the last 168 hours. Coagulation Profile:  Recent Labs Lab 01/30/2016 1206  INR 6.02*   Cardiac Enzymes:  Recent Labs Lab 02/04/2016 1206  TROPONINI 0.05*   BNP (last 3 results) No results for input(s): PROBNP in the last 8760 hours. HbA1C: No results for input(s): HGBA1C in the last 72 hours. CBG: No results for input(s): GLUCAP in the last 168 hours. Lipid Profile: No results for input(s): CHOL, HDL, LDLCALC, TRIG, CHOLHDL, LDLDIRECT in the last 72 hours. Thyroid Function Tests: No results for input(s): TSH, T4TOTAL, FREET4, T3FREE, THYROIDAB in the last 72 hours. Anemia Panel: No results for input(s): VITAMINB12, FOLATE, FERRITIN, TIBC, IRON, RETICCTPCT in the last 72 hours. Urine analysis:    Component Value Date/Time   COLORURINE AMBER* 02/16/2016 1520   APPEARANCEUR TURBID* 02/14/2016 1520   LABSPEC 1.039* 02/15/2016 1520   PHURINE 5.0 02/13/2016 1520   GLUCOSEU NEGATIVE 01/22/2016 1520   HGBUR SMALL* 02/09/2016 1520   BILIRUBINUR LARGE* 01/25/2016 1520   KETONESUR NEGATIVE 01/22/2016 1520   PROTEINUR NEGATIVE 01/29/2016 1520   UROBILINOGEN 0.2 01/10/2013 1350   NITRITE POSITIVE* 01/31/2016 1520   LEUKOCYTESUR SMALL* 02/04/2016 1520   Sepsis Labs: !!!!!!!!!!!!!!!!!!!!!!!!!!!!!!!!!!!!!!!!!!!! @LABRCNTIP (procalcitonin:4,lacticidven:4) )No results found for this or any previous visit (from the past 240 hour(s)).   Radiological Exams on Admission: Ct Abdomen Pelvis W Contrast  02/16/2016  CLINICAL DATA:  Diffuse abdominal pain EXAM: CT ABDOMEN AND PELVIS WITH CONTRAST TECHNIQUE: Multidetector CT imaging of the abdomen and pelvis was performed using the standard protocol following bolus administration of intravenous contrast. CONTRAST:  13m ISOVUE-300 IOPAMIDOL (ISOVUE-300) INJECTION 61% COMPARISON:  None. FINDINGS: Lower chest: Moderate right pleural effusion. Bibasilar atelectasis. Marked  cardiac enlargement. Coronary calcification. Hepatobiliary: Marked intrahepatic and extrahepatic biliary dilatation. Common bile duct 17 mm. Large round soft tissue density in the common bile duct compatible with common duct stone. Gallbladder markedly dilated without gallbladder wall thickening. Several small gallstones in the gallbladder. Small liver. Liver contour is irregular. Left lobe liver is  enlarged compared to the right. Possible cirrhosis. Pancreas: Atrophic pancreas. Pancreatic duct dilatation. No pancreatic mass or pancreatitis. Spleen: Negative Adrenals/Urinary Tract: Both kidneys normal in size. No obstruction or mass in the kidneys. Stomach/Bowel: Negative for bowel obstruction. No bowel mass or edema. Vascular/Lymphatic: Atherosclerotic calcification in the aorta. No aneurysm. No lymphadenopathy. Reproductive: Hysterectomy.  No pelvic mass. Other: Moderate free fluid in the pelvis. Moderate amount of free fluid around the liver Musculoskeletal: Lumbar dextroscoliosis. Disc degeneration in the lumbar spine most notably L2-3. No fracture. No acute skeletal lesion. IMPRESSION: Marked biliary duct dilatation. Common bile duct 17 mm. Common bile duct stone. Marked gallbladder dilatation without thickening. Cholelithiasis. Probable cirrhosis of liver with moderate ascites. Marked cardiac enlargement with right pleural effusion Atherosclerotic disease in the aorta and coronary arteries. Electronically Signed   By: Franchot Gallo M.D.   On: 01/28/2016 14:07   Dg Chest Portable 1 View  02/17/2016  CLINICAL DATA:  Abdominal pain, possible free abdominal air EXAM: PORTABLE CHEST 1 VIEW COMPARISON:  01/11/2013 FINDINGS: Cardiomegaly again noted. There is small right pleural effusion with streaky right basilar atelectasis or infiltrate. Mild left basilar atelectasis. Atherosclerotic calcifications of thoracic aorta. Mild levoscoliosis lower thoracic spine. There is no definite evidence of free abdominal air.  Moderate gas noted within proximal stomach. IMPRESSION: There is small right pleural effusion with streaky right basilar atelectasis or infiltrate. Mild left basilar atelectasis. Atherosclerotic calcifications of thoracic aorta. Mild levoscoliosis lower thoracic spine. There is no definite evidence of free abdominal air. Moderate gas within proximal stomach. Electronically Signed   By: Lahoma Crocker M.D.   On: 02/14/2016 12:36    EKG: Independently reviewed. A fib.   Assessment/Plan Active Problems:   MVP (mitral valve prolapse)   Chronic anticoagulation   Atrial fibrillation ([facility])   Hypothyroidism   CHF (congestive heart failure) ([facility])   Abnormal transaminases   UTI (lower urinary tract infection)   Lactic acidosis  1-Transaminases, Hyperbilirubinemia; RUQ abdominal pain; choledocholithiasis and cholelithiasis.  Admit to step down unit./  IV Protonix.  GI and surgery consulted. consulted.  Concern for cholecystitis.  IV antibiotics. IV fluids. NPO.  Check ammonia level.   2-lactic acidosis; IV fluids, IV antibiotics. Monitor BP, no leukocytosis or fever. Will treat for infectious process.  Blood culture ordered.   3-supra-therapeutic INR; hold coumadin. Received vitamin K anticipating GI procedure.   4-UTI; UA with too numerous to count WBC. IV antibiotics ordered.    5-A fib; IV metoprolol PRN. Hold coumadin.   6-Mild elevated troponin; insetting of acute illness,afib . Continue to cycle.  7-Hypothyroidism; continue with synthroid.   DVT prophylaxis: SCD, supra-theraputic INR  Code Status: DNR Family Communication: son over phone.,# [phone] Disposition Plan: to be determine.  Consults called: Dr Florene Glen  Admission status: inpatient,step down unit    Elmarie Shiley MD [facility] Pager [phone]  If 7PM-7AM, please contact night-coverage [email or web address] Password [facility]  01/24/2016, 4:18 PM

## 2016-02-10 NOTE — Consult Note (Signed)
Referring Provider:   B. Tyrell Antonio, MD Primary Care Physician:  Estill Dooms, MD Primary Gastroenterologist:  None (unassigned)  Reason for Consultation: Choledocholithiasis with cholangitis  HPI: Rose Weber is a 81 y.o. female admitted through the emergency room this afternoon. The history is obtained from the referring physician and the medical record, since the patient is currently very somnolent following pain medication.  She is a several day history of abdominal pain, and was found to have marked elevation of bilirubin and moderate elevation of transaminases on admission (white count, temperature, and lipase all normal).  CT scan shows multiple gallstones, marked intrahepatic and extrahepatic biliary ductal dilatation, and a large filling defect in the common bile duct, without any pancreatic mass.  By history, the patient apparently has a fairly good functional status despite her very advanced age. She is chronically on Coumadin for atrial fibrillation, and comes in over anticoagulated, with an INR of 6.  Her CT scan raised the question of cirrhosis based on a nodular liver with an enlarged left node and the presence of some ascites; however, her platelet count is normal.   Past Medical History  Diagnosis Date  . HTN (hypertension)   . MVP (mitral valve prolapse)   . Mitral insufficiency     SEVERE  . Chronic anticoagulation   . Edema   . GERD (gastroesophageal reflux disease)   . Personal history of goiter   . Hypothyroidism   . CHF (congestive heart failure) ([place])   . Mitral valve prolapse     With severe mitral insufficiency  . Atrial fibrillation ([place])   . Goiter     History of goiter  . PNA (pneumonia)     strep  5/14  . Other malaise and fatigue 01/25/2012  . Visual disturbances 10/12/2010  . Benign neoplasm of colon 04/09/2003  . Cardiac dysrhythmia, unspecified 09/26/2000  . Other corneal degenerations 02/19/1986    Fuchs corneal dystrophy    Past Surgical  History  Procedure Laterality Date  . Cardioversion  04/14/10  . Appendectomy  1932  . Small intestine surgery      SBO 2000  . Abdominal hysterectomy  1962  . Corneal transplant      MULTIPLE  . Rectal polypectomy    . Eye surgery Left 07/2013    replaced valve in eye    Prior to Admission medications   Medication Sig Start Date End Date Taking? Authorizing Provider  acetaminophen (TYLENOL) 650 MG CR tablet Take 650 mg by mouth every 8 (eight) hours as needed for pain.   Yes Historical Provider, MD  alum & mag hydroxide-simeth (MAALOX/MYLANTA) 200-200-20 MG/5ML suspension Take 30 mLs by mouth every 4 (four) hours as needed for indigestion or heartburn.   Yes Historical Provider, MD  bacitracin ophthalmic ointment Place 1 application into both eyes at bedtime.    Yes Historical Provider, MD  benazepril (LOTENSIN) 20 MG tablet Take 1 tablet each morning to control blood pressure and strengthen the heart Patient taking differently: Take 20 mg by mouth daily. Take 1 tablet each morning to control blood pressure and strengthen the heart 11/28/15  Yes Estill Dooms, MD  carboxymethylcellulose (REFRESH PLUS) 0.5 % SOLN Instill 1 drop into both eyes four times daily   Yes Historical Provider, MD  furosemide (LASIX) 20 MG tablet Take one tablet each morning to control fluid retention Patient taking differently: Take 20 mg by mouth daily.  11/28/15  Yes Estill Dooms, MD  levothyroxine (SYNTHROID, [place])  25 MCG tablet Take 25 mcg by mouth every other day.    Yes Historical Provider, MD  metoprolol tartrate (LOPRESSOR) 25 MG tablet One twice daily to control blood pressure Patient taking differently: Take 25 mg by mouth 2 (two) times daily.  02/25/15  Yes Estill Dooms, MD  polyvinyl alcohol (LIQUIFILM TEARS) 1.4 % ophthalmic solution Place 1 drop into both eyes QID.   Yes Historical Provider, MD  potassium chloride SA (K-DUR,KLOR-CON) 20 MEQ tablet Take 20 meq each day for potassium  supplement Patient taking differently: Take 20 mEq by mouth daily.  11/28/15  Yes Estill Dooms, MD  PRESCRIPTION MEDICATION Place 1 drop into both eyes 2 (two) times daily. *Cyclosporin 2% Opth Soln* - Gets from [facility], keep refrigerated   Yes Historical Provider, MD  traMADol (ULTRAM) 50 MG tablet Take 50 mg by mouth every 6 (six) hours as needed for moderate pain.   Yes Historical Provider, MD  warfarin (COUMADIN) 3 MG tablet Take 3 mg by mouth daily at 6 PM.   Yes Historical Provider, MD    Current Facility-Administered Medications  Medication Dose Route Frequency Provider Last Rate Last Dose  . 0.9 %  sodium chloride infusion   Intravenous Continuous Belkys A Regalado, MD 75 mL/hr at 01/20/2016 1815    . aztreonam (AZACTAM) 500 mg in dextrose 5 % 50 mL IVPB  500 mg Intravenous Q8H Donald Prose Runyon, RPH      . fentaNYL (SUBLIMAZE) injection 12.5-25 mcg  12.5-25 mcg Intravenous Q2H PRN Belkys A Regalado, MD   12.5 mcg at 01/22/2016 1737  . [START ON 02/11/2016] levothyroxine (SYNTHROID, LEVOTHROID) tablet 25 mcg  25 mcg Oral Once every other day Belkys A Regalado, MD      . metoprolol (LOPRESSOR) injection 5 mg  5 mg Intravenous Q8H PRN Belkys A Regalado, MD      . metroNIDAZOLE (FLAGYL) IVPB 500 mg  500 mg Intravenous Q8H Amanda M Runyon, RPH      . ondansetron (ZOFRAN) tablet 4 mg  4 mg Oral Q6H PRN Belkys A Regalado, MD       Or  . ondansetron (ZOFRAN) injection 4 mg  4 mg Intravenous Q6H PRN Belkys A Regalado, MD      . oxyCODONE (Oxy IR/ROXICODONE) immediate release tablet 5 mg  5 mg Oral Q4H PRN Belkys A Regalado, MD      . Derrill Memo ON 02/11/2016] pantoprazole (PROTONIX) injection 40 mg  40 mg Intravenous Q12H Belkys A Regalado, MD      . Derrill Memo ON 02/11/2016] polyvinyl alcohol (LIQUIFILM TEARS) 1.4 % ophthalmic solution 1 drop  1 drop Both Eyes QID Belkys A Regalado, MD      . sodium chloride flush (NS) 0.9 % injection 3 mL  3 mL Intravenous Q12H Belkys A Regalado, MD      . Derrill Memo  ON 02/17/2016] vancomycin (VANCOCIN) 500 mg in sodium chloride 0.9 % 100 mL IVPB  500 mg Intravenous Q48H Donald Prose Runyon, RPH        Allergies as of 01/20/2016 - Review Complete 02/09/2016  Allergen Reaction Noted  . Diphenhydramine hcl Shortness Of Breath 11/18/2015  . Alphagan [brimonidine tartrate]  02/16/2011  . Amlodipine Other (See Comments) 11/18/2015  . Avelox [moxifloxacin hcl in nacl]  01/25/2012  . Benadryl [diphenhydramine]  02/18/2013  . Benzalkonium Other (See Comments) 11/18/2015  . Brimonidine Other (See Comments) 11/18/2015  . Bystolic [nebivolol hcl]  02/16/2011  . Combigan [brimonidine tartrate-timolol]  09/20/2015  .  Difluprednate Other (See Comments) 11/18/2015  . Diltiazem  01/25/2012  . Diovan [valsartan]  02/16/2011  . Erythromycin  08/03/2000  . Estradiol Other (See Comments) 11/18/2015  . Fluorometholone Other (See Comments) 11/18/2015  . Nebivolol Other (See Comments) 11/18/2015  . Other  09/20/2010  . Penicillins  09/20/2010  . Polymyxin b-trimethoprim  02/16/2011  . Restasis [cyclosporine]  01/25/2012  . Sulfa drugs cross reactors  09/20/2010  . Tekturna [aliskiren fumarate]  02/16/2011  . Travatan z  02/16/2011  . Xalatan [latanoprost]  01/25/2012  . Lactase Other (See Comments) 11/18/2015    Family History  Problem Relation Age of Onset  . Hip fracture Sister   . Stroke Father   . Alzheimer's disease Brother   . Stroke Sister     Social History   Social History  . Marital Status: Widowed    Spouse Name: N/A  . Number of Children: 1  . Years of Education: N/A   Occupational History  . 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 Smoking status: Never Smoker   . Smokeless tobacco: Never Used  . Alcohol Use: No  . Drug Use: No  . Sexual Activity: No   Other Topics Concern  . Not on file   Social History Narrative   Lives at [place] with walker   Never smoked   Alcohol none    Exercise none due to poor vision   DNR, Living Will, POA    Review of Systems: Currently unobtainable in this obtunded patient  Physical Exam: Vital signs in last 24 hours: Temp:  [97.4 F (36.3 C)] 97.4 F (36.3 C) (06/22 1237) Pulse Rate:  [63-128] 126 (06/22 1510) Resp:  [14-25] 14 (06/22 1800) BP: (91-126)/(37-90) 91/37 mmHg (06/22 1800) SpO2:  [81 %-100 %] 97 % (06/22 1800) Weight:  [52.4 kg (115 lb 8.3 oz)] 52.4 kg (115 lb 8.3 oz) (06/22 1724)   This is a rather frail appearing, elderly Caucasian female in no distress. She was asleep when I came in the room, but was arousable. However, she could not engage in any conversation. She exhibited purposeful movement, for example, pushing my hand away when I examined her abdomen.  The chest is clear;  the heart has a rapid rate with a 2/6 systolic murmur. The abdomen is nondistended but diffusely fairly severely tender, without obvious peritoneal findings and without overt ascites clinically. There is 2+ pitting edema of the lower extremities. The patient is without overt jaundice, the skin is warm and dry. No obvious focal neurologic deficits.  Intake/Output from previous day:   Intake/Output this shift:    Lab Results:  Recent Labs  02/15/2016 1206 01/21/2016 1212  WBC 8.8  --   HGB 15.6* 17.0*  HCT 44.7 50.0*  PLT 206  --    BMET  Recent Labs  02/07/2016 1206 02/13/2016 1212  NA 138 138  K 4.7 4.5  CL 103 104  CO2 23  --   GLUCOSE 83 79  BUN 25* 23*  CREATININE 1.47* 1.50*  CALCIUM 9.3  --    LFT  Recent Labs  02/18/2016 1206  PROT 7.0  ALBUMIN 3.3*  AST 84*  ALT 93*  ALKPHOS 377*  BILITOT 8.5*   PT/INR  Recent Labs  02/02/2016 1206  LABPROT 51.7*  INR 6.02*    Studies/Results: Ct Abdomen Pelvis W Contrast  01/31/2016  CLINICAL DATA:  Diffuse abdominal pain  EXAM: CT ABDOMEN AND PELVIS WITH CONTRAST TECHNIQUE: Multidetector CT imaging of the abdomen and pelvis was performed using the standard protocol  following bolus administration of intravenous contrast. CONTRAST:  33mL ISOVUE-300 IOPAMIDOL (ISOVUE-300) INJECTION 61% COMPARISON:  None. FINDINGS: Lower chest: Moderate right pleural effusion. Bibasilar atelectasis. Marked cardiac enlargement. Coronary calcification. Hepatobiliary: Marked intrahepatic and extrahepatic biliary dilatation. Common bile duct 17 mm. Large round soft tissue density in the common bile duct compatible with common duct stone. Gallbladder markedly dilated without gallbladder wall thickening. Several small gallstones in the gallbladder. Small liver. Liver contour is irregular. Left lobe liver is enlarged compared to the right. Possible cirrhosis. Pancreas: Atrophic pancreas. Pancreatic duct dilatation. No pancreatic mass or pancreatitis. Spleen: Negative Adrenals/Urinary Tract: Both kidneys normal in size. No obstruction or mass in the kidneys. Stomach/Bowel: Negative for bowel obstruction. No bowel mass or edema. Vascular/Lymphatic: Atherosclerotic calcification in the aorta. No aneurysm. No lymphadenopathy. Reproductive: Hysterectomy.  No pelvic mass. Other: Moderate free fluid in the pelvis. Moderate amount of free fluid around the liver Musculoskeletal: Lumbar dextroscoliosis. Disc degeneration in the lumbar spine most notably L2-3. No fracture. No acute skeletal lesion. IMPRESSION: Marked biliary duct dilatation. Common bile duct 17 mm. Common bile duct stone. Marked gallbladder dilatation without thickening. Cholelithiasis. Probable cirrhosis of liver with moderate ascites. Marked cardiac enlargement with right pleural effusion Atherosclerotic disease in the aorta and coronary arteries. Electronically Signed   By: Franchot Gallo M.D.   On: 02/04/2016 14:07   Dg Chest Portable 1 View  02/15/2016  CLINICAL DATA:  Abdominal pain, possible free abdominal air EXAM: PORTABLE CHEST 1 VIEW COMPARISON:  01/11/2013 FINDINGS: Cardiomegaly again noted. There is small right pleural effusion with  streaky right basilar atelectasis or infiltrate. Mild left basilar atelectasis. Atherosclerotic calcifications of thoracic aorta. Mild levoscoliosis lower thoracic spine. There is no definite evidence of free abdominal air. Moderate gas noted within proximal stomach. IMPRESSION: There is small right pleural effusion with streaky right basilar atelectasis or infiltrate. Mild left basilar atelectasis. Atherosclerotic calcifications of thoracic aorta. Mild levoscoliosis lower thoracic spine. There is no definite evidence of free abdominal air. Moderate gas within proximal stomach. Electronically Signed   By: Lahoma Crocker M.D.   On: 02/13/2016 12:36    Impression: 1. Abdominal pain 2. Cholelithiasis with choledocholithiasis and elevated liver chemistries 3. Evidence for cirrhosis by CT scan, also radiographic ascites and peripheral edema, but normal platelet count. 4. Chronic anticoagulation, current supratherapeutic INR, but stool Hemoccult negative and no evidence of pathologic bleeding. 5. Markedly advanced age, but apparently reasonable cognitive function at baseline, unable to assess at present following pain medication.  Plan: Surgical consultation noted.  Agree with antibiotic therapy.  Needs correction of INR; my understanding is that the patient was to receive IV vitamin K in the emergency room.  Hopefully, the patient will "cool down" on antibiotic therapy, allowing more time for discussion with the patient (when she is able to participate in such discussion) regarding pros and cons of various courses of action. She is not clinically septic at this time, so there is no need for emergent ERCP.  In general, I think this patient would be best served by ERCP with sphincterotomy and stone extraction. It does appear that, despite her advanced age, she is probably capable of being scoped with reasonable safety. However, her stone is large and if she has difficult anatomy or the stone cannot be  removed in its entirety, another option would be stent placement for decompression.  Further  management will be determined by the patient's clinical course and her preferences regarding aggressiveness of management.     LOS: 0 days   Waylan Busta V  01/30/2016, 6:44 PM   Pager [phone] If no answer or after 5 PM call [phone]

## 2016-02-10 NOTE — ED Provider Notes (Signed)
CSN: [id number]     Arrival date & time 02/15/2016  1029 History   First MD Initiated Contact with Patient 02/15/2016 1118     Chief Complaint  Patient presents with  . Abdominal Pain     (Consider location/radiation/quality/duration/timing/severity/associated sxs/prior Treatment) HPI  82 year old female presents with abdominal pain. Patient tells me the pain started yesterday and is mostly left-sided. Currently is severe. Patient states she is short of breath but denies chest pain. No vomiting, diarrhea, or urinary symptoms. Denies back pain. Per report from SNF patient's blood pressure was 91/63. Unknown if she is having fevers. She has a history of chronic atrial fibrillation and is on warfarin.  Past Medical History  Diagnosis Date  . HTN (hypertension)   . MVP (mitral valve prolapse)   . Mitral insufficiency     SEVERE  . Chronic anticoagulation   . Edema   . GERD (gastroesophageal reflux disease)   . Personal history of goiter   . Hypothyroidism   . CHF (congestive heart failure) ([place])   . Mitral valve prolapse     With severe mitral insufficiency  . Atrial fibrillation ([place])   . Goiter     History of goiter  . PNA (pneumonia)     strep  5/14  . Other malaise and fatigue 01/25/2012  . Visual disturbances 10/12/2010  . Benign neoplasm of colon 04/09/2003  . Cardiac dysrhythmia, unspecified 09/26/2000  . Other corneal degenerations 02/19/1986    Fuchs corneal dystrophy   Past Surgical History  Procedure Laterality Date  . Cardioversion  04/14/10  . Appendectomy  1932  . Small intestine surgery      SBO 2000  . Abdominal hysterectomy  1962  . Corneal transplant      MULTIPLE  . Rectal polypectomy    . Eye surgery Left 07/2013    replaced valve in eye   Family History  Problem Relation Age of Onset  . Hip fracture Sister   . Stroke Father   . Alzheimer's disease Brother   . Stroke Sister    Social History  Substance Use Topics  . Smoking status: Never Smoker   .  Smokeless tobacco: Never Used  . Alcohol Use: No   OB History    No data available     Review of Systems  Constitutional: Negative for fever.  Respiratory: Positive for shortness of breath.   Cardiovascular: Negative for chest pain.  Gastrointestinal: Positive for abdominal pain. Negative for vomiting and diarrhea.  Genitourinary: Negative for dysuria.  Musculoskeletal: Negative for back pain.  All other systems reviewed and are negative.     Allergies  Diphenhydramine hcl; Alphagan; Amlodipine; Avelox; Benadryl; Benzalkonium; Brimonidine; Bystolic; Combigan; Difluprednate; Diltiazem; Diovan; Erythromycin; Estradiol; Fluorometholone; Nebivolol; Other; Penicillins; Polymyxin b-trimethoprim; Restasis; Sulfa drugs cross reactors; Tekturna; Travatan z; Xalatan; and Lactase  Home Medications   Prior to Admission medications   Medication Sig Start Date End Date Taking? Authorizing Provider  benazepril (LOTENSIN) 20 MG tablet Take 1 tablet each morning to control blood pressure and strengthen the heart 11/28/15   Estill Dooms, MD  carboxymethylcellulose (REFRESH PLUS) 0.5 % SOLN Instill 1 drop into both eyes four times daily    Historical Provider, MD  cycloSPORINE (RESTASIS) 0.05 % ophthalmic emulsion Apply 1 drop to eye 2 (two) times daily. Reported on 01/27/2016    Historical Provider, MD  furosemide (LASIX) 20 MG tablet Take one tablet each morning to control fluid retention 11/28/15   Estill Dooms, MD  levothyroxine (SYNTHROID, LEVOTHROID) 25 MCG tablet Take one tablet every other day * pulse weekly on Wednesday*    Historical Provider, MD  metoprolol tartrate (LOPRESSOR) 25 MG tablet One twice daily to control blood pressure 02/25/15   Estill Dooms, MD  polyvinyl alcohol (LIQUIFILM TEARS) 1.4 % ophthalmic solution Place 1 drop into both eyes QID.    Historical Provider, MD  potassium chloride SA (K-DUR,KLOR-CON) 20 MEQ tablet Take 20 meq each day for potassium supplement 11/28/15   Estill Dooms, MD  warfarin (COUMADIN) 4 MG tablet Take 4 mg by mouth daily.     Historical Provider, MD   BP 126/90 mmHg  Pulse 126  Temp(Src) 97.4 F (36.3 C) (Rectal)  Resp 25  SpO2 100% Physical Exam  Constitutional: She is oriented to person, place, and time. She appears well-developed and well-nourished.  HENT:  Head: Normocephalic and atraumatic.  Right Ear: External ear normal.  Left Ear: External ear normal.  Nose: Nose normal.  Eyes: Right eye exhibits no discharge. Left eye exhibits no discharge.  Cardiovascular: Normal heart sounds.  An irregular rhythm present. Tachycardia present.   Pulmonary/Chest: Breath sounds normal. Tachypnea noted.  Abdominal: Soft. She exhibits distension. There is tenderness (diffuse). There is guarding.  Neurological: She is alert and oriented to person, place, and time.  Skin: Skin is warm and dry.  Nursing note and vitals reviewed.   ED Course  Procedures (including critical care time) Labs Review Labs Reviewed  COMPREHENSIVE METABOLIC PANEL - Abnormal; Notable for the following:    BUN 25 (*)    Creatinine, Ser 1.47 (*)    Albumin 3.3 (*)    AST 84 (*)    ALT 93 (*)    Alkaline Phosphatase 377 (*)    Total Bilirubin 8.5 (*)    GFR calc non Af Amer 28 (*)    GFR calc Af Amer 32 (*)    All other components within normal limits  TROPONIN I - Abnormal; Notable for the following:    Troponin I 0.05 (*)    All other components within normal limits  CBC WITH DIFFERENTIAL/PLATELET - Abnormal; Notable for the following:    Hemoglobin 15.6 (*)    RDW 16.6 (*)    Neutro Abs 7.8 (*)    Lymphs Abs 0.6 (*)    All other components within normal limits  URINALYSIS, ROUTINE W REFLEX MICROSCOPIC (NOT AT [facility]) - Abnormal; Notable for the following:    Color, Urine AMBER (*)    APPearance TURBID (*)    Specific Gravity, Urine 1.039 (*)    Hgb urine dipstick SMALL (*)    Bilirubin Urine LARGE (*)    Nitrite POSITIVE (*)    Leukocytes, UA SMALL  (*)    All other components within normal limits  PROTIME-INR - Abnormal; Notable for the following:    Prothrombin Time 51.7 (*)    INR 6.02 (*)    All other components within normal limits  URINE MICROSCOPIC-ADD ON - Abnormal; Notable for the following:    Squamous Epithelial / LPF 6-30 (*)    Bacteria, UA MANY (*)    Casts HYALINE CASTS (*)    Crystals CA OXALATE CRYSTALS (*)    All other components within normal limits  I-STAT CG4 LACTIC ACID, ED - Abnormal; Notable for the following:    Lactic Acid, Venous 3.66 (*)    All other components within normal limits  I-STAT CHEM 8, ED - Abnormal; Notable for the  following:    BUN 23 (*)    Creatinine, Ser 1.50 (*)    Calcium, Ion 1.11 (*)    Hemoglobin 17.0 (*)    HCT 50.0 (*)    All other components within normal limits  URINE CULTURE  CULTURE, BLOOD (ROUTINE X 2)  CULTURE, BLOOD (ROUTINE X 2)  LIPASE, BLOOD  LACTIC ACID, PLASMA  LACTIC ACID, PLASMA  AMMONIA  TROPONIN I  TROPONIN I  TROPONIN I  POC OCCULT BLOOD, ED    Imaging Review Ct Abdomen Pelvis W Contrast  01/27/2016  CLINICAL DATA:  Diffuse abdominal pain EXAM: CT ABDOMEN AND PELVIS WITH CONTRAST TECHNIQUE: Multidetector CT imaging of the abdomen and pelvis was performed using the standard protocol following bolus administration of intravenous contrast. CONTRAST:  80mL ISOVUE-300 IOPAMIDOL (ISOVUE-300) INJECTION 61% COMPARISON:  None. FINDINGS: Lower chest: Moderate right pleural effusion. Bibasilar atelectasis. Marked cardiac enlargement. Coronary calcification. Hepatobiliary: Marked intrahepatic and extrahepatic biliary dilatation. Common bile duct 17 mm. Large round soft tissue density in the common bile duct compatible with common duct stone. Gallbladder markedly dilated without gallbladder wall thickening. Several small gallstones in the gallbladder. Small liver. Liver contour is irregular. Left lobe liver is enlarged compared to the right. Possible cirrhosis.  Pancreas: Atrophic pancreas. Pancreatic duct dilatation. No pancreatic mass or pancreatitis. Spleen: Negative Adrenals/Urinary Tract: Both kidneys normal in size. No obstruction or mass in the kidneys. Stomach/Bowel: Negative for bowel obstruction. No bowel mass or edema. Vascular/Lymphatic: Atherosclerotic calcification in the aorta. No aneurysm. No lymphadenopathy. Reproductive: Hysterectomy.  No pelvic mass. Other: Moderate free fluid in the pelvis. Moderate amount of free fluid around the liver Musculoskeletal: Lumbar dextroscoliosis. Disc degeneration in the lumbar spine most notably L2-3. No fracture. No acute skeletal lesion. IMPRESSION: Marked biliary duct dilatation. Common bile duct 17 mm. Common bile duct stone. Marked gallbladder dilatation without thickening. Cholelithiasis. Probable cirrhosis of liver with moderate ascites. Marked cardiac enlargement with right pleural effusion Atherosclerotic disease in the aorta and coronary arteries. Electronically Signed   By: Franchot Gallo M.D.   On: 02/11/2016 14:07   Dg Chest Portable 1 View  02/09/2016  CLINICAL DATA:  Abdominal pain, possible free abdominal air EXAM: PORTABLE CHEST 1 VIEW COMPARISON:  01/11/2013 FINDINGS: Cardiomegaly again noted. There is small right pleural effusion with streaky right basilar atelectasis or infiltrate. Mild left basilar atelectasis. Atherosclerotic calcifications of thoracic aorta. Mild levoscoliosis lower thoracic spine. There is no definite evidence of free abdominal air. Moderate gas noted within proximal stomach. IMPRESSION: There is small right pleural effusion with streaky right basilar atelectasis or infiltrate. Mild left basilar atelectasis. Atherosclerotic calcifications of thoracic aorta. Mild levoscoliosis lower thoracic spine. There is no definite evidence of free abdominal air. Moderate gas within proximal stomach. Electronically Signed   By: Lahoma Crocker M.D.   On: 02/08/2016 12:36   I have personally  reviewed and evaluated these images and lab results as part of my medical decision-making.   EKG Interpretation   Date/Time:  Thursday February 10 2016 11:47:11 EDT Ventricular Rate:  119 PR Interval:    QRS Duration: 118 QT Interval:  344 QTC Calculation: 457 R Axis:   61 Text Interpretation:  Atrial fibrillation Nonspecific intraventricular  conduction delay Low voltage, precordial leads Abnormal T, consider  ischemia, lateral leads Baseline wander in lead(s) II III aVF ST/T's are  improved compared to 2014 Confirmed by Juanice Warburton MD, Lilliauna Van [phone]) on  02/11/2016 12:29:15 PM      CRITICAL CARE Performed by: Sherwood Gambler  T   Total critical care time: 30 minutes  Critical care time was exclusive of separately billable procedures and treating other patients.  Critical care was necessary to treat or prevent imminent or life-threatening deterioration.  Critical care was time spent personally by me on the following activities: development of treatment plan with patient and/or surrogate as well as nursing, discussions with consultants, evaluation of patient's response to treatment, examination of patient, obtaining history from patient or surrogate, ordering and performing treatments and interventions, ordering and review of laboratory studies, ordering and review of radiographic studies, pulse oximetry and re-evaluation of patient's condition.  MDM   Final diagnoses:  Cholecystitis  Severe sepsis ([place])    Patient percent with severe abdominal pain. Is afebrile but given her tachycardia, tachypnea, and lactic acidosis I'm concerned for sepsis. Has multiple antibiotic allergies and I consulted the pharmacist and we decided on aztreonam and Flagyl to cover intra-abdominal pathology. It appears to be coming from her gallbladder based on CT scan and abnormal LFTs. Patient is feeling much better after fluids and morphine. She was treated per sepsis protocol. Discussed with GI, Dr. Cristina Gong,  who recommends IV vit K to help with warfarin coagulopathy and he will see today. Surgery will also consult. Patient prefers to not have surgery if possible. She is not altered and is with it enough to be able to make decisions. Hospitalist to admit to stepdown. Tried to call son but his number is not in system and friend that was at bedside is not answering phone currently or in ED.  Sherwood Gambler, MD 01/30/2016 1723

## 2016-02-10 NOTE — Progress Notes (Addendum)
Pharmacy Antibiotic Note  Rose Weber is a 82 y.o. female from SNF admitted on 02/09/2016 with abdominal pain, sepsis.  Pharmacy has been consulted for Aztreonam, Flagyl, Vancomycin dosing.  Aztreonam 1g, Flagyl 500 mg, Vanc 1g ordered STAT for ED.  Patient has a penicillin allergy listed without documented reaction.  Per chart review, no history of any penicillins or cephalosporins administered previously.  CrCl~14 ml/min.  Plan: Aztreonam 500 mg IV q8h. Flagyl 500 mg IV q8h. Vancomycin 500 mg IV q48h.  Patient will have received adequate loading dose in ED. F/u SCr, vanc levels as indicated, clinical course.     Temp (24hrs), Avg:97.4 F (36.3 C), Min:97.4 F (36.3 C), Max:97.4 F (36.3 C)   Recent Labs Lab 02/05/2016 1206 02/15/2016 1212 02/01/2016 1213  WBC 8.8  --   --   CREATININE 1.47* 1.50*  --   LATICACIDVEN  --   --  3.66*    CrCl cannot be calculated (Unknown ideal weight.).    Allergies  Allergen Reactions  . Diphenhydramine Hcl Shortness Of Breath  . Alphagan [Brimonidine Tartrate]   . Amlodipine Other (See Comments)    Excess edema  . Avelox [Moxifloxacin Hcl In Nacl]   . Benadryl [Diphenhydramine]   . Benzalkonium Other (See Comments)  . Brimonidine Other (See Comments)    Other Reaction: increased HR and SOB  . Bystolic [Nebivolol Hcl]   . Combigan [Brimonidine Tartrate-Timolol]   . Difluprednate Other (See Comments)    Other Reaction: increased HR, and SOB  . Diltiazem   . Diovan [Valsartan]   . Erythromycin   . Estradiol Other (See Comments)  . Fluorometholone Other (See Comments)  . Nebivolol Other (See Comments)  . Other     PRESERVATIVES  . Penicillins   . Polymyxin B-Trimethoprim   . Restasis [Cyclosporine]   . Sulfa Drugs Cross Reactors   . Tekturna [Aliskiren Fumarate]   . Travatan Z   . Xalatan [Latanoprost]   . Lactase Other (See Comments)    Other Reaction: GI Upset    Antimicrobials this admission: 6/22 Aztreonam >>  6/22  Flagyl >>  6/22 Vancomycin >>  Dose adjustments this admission: -  Microbiology results: 6/22 BCx: sent 6/22 UCx: sent   Thank you for allowing pharmacy to be a part of this patient's care.  Hershal Coria 02/08/2016 1:12 PM

## 2016-02-10 NOTE — ED Notes (Signed)
SNF report sheet states pt has abdominal pain.  VS prior to them sending her here were: 91/63, 61, 18, 97.0.

## 2016-02-10 NOTE — ED Notes (Signed)
Dr Regenia Skeeter informed about elevated lactic.

## 2016-02-10 NOTE — Progress Notes (Signed)
CRITICAL VALUE ALERT  Critical value received:  Lactic Acid 2.2  Date of notification:  02/09/2016  Time of notification:  1947  Critical value read back:Yes  Nurse who received alert:  Mendel Corning  MD notified (1st page):  L. [place] Time of first page:  2020  MD notified (2nd page):  Time of second page:  Responding MD:  Roger Shelter  Time MD responded:  2030

## 2016-02-10 NOTE — ED Notes (Signed)
Called step down to start 20 minutes timer, CN requested more time as they are receiving another critical pt from the floor and they do not have a nurse to accept this pt at this time. She offered to call us back in 30 minutes.

## 2016-02-10 NOTE — ED Notes (Signed)
Per EMS, patient from [place] at [place].  She is having generalized abdominal pain x 1 day.  No n/b/d.  BP:110/64 P:94 irregular A-FIB R:16 O2: 96%

## 2016-02-11 DIAGNOSIS — K8021 Calculus of gallbladder without cholecystitis with obstruction: Secondary | ICD-10-CM

## 2016-02-11 DIAGNOSIS — E872 Acidosis: Secondary | ICD-10-CM

## 2016-02-11 DIAGNOSIS — Z7901 Long term (current) use of anticoagulants: Secondary | ICD-10-CM

## 2016-02-11 DIAGNOSIS — I4891 Unspecified atrial fibrillation: Secondary | ICD-10-CM

## 2016-02-11 DIAGNOSIS — K8051 Calculus of bile duct without cholangitis or cholecystitis with obstruction: Secondary | ICD-10-CM

## 2016-02-11 LAB — COMPREHENSIVE METABOLIC PANEL
ALT: 59 U/L — AB (ref 14–54)
AST: 40 U/L (ref 15–41)
Albumin: 2.5 g/dL — ABNORMAL LOW (ref 3.5–5.0)
Alkaline Phosphatase: 261 U/L — ABNORMAL HIGH (ref 38–126)
Anion gap: 8 (ref 5–15)
BILIRUBIN TOTAL: 6.1 mg/dL — AB (ref 0.3–1.2)
BUN: 27 mg/dL — ABNORMAL HIGH (ref 6–20)
CHLORIDE: 111 mmol/L (ref 101–111)
CO2: 20 mmol/L — ABNORMAL LOW (ref 22–32)
CREATININE: 1.48 mg/dL — AB (ref 0.44–1.00)
Calcium: 8.1 mg/dL — ABNORMAL LOW (ref 8.9–10.3)
GFR, EST AFRICAN AMERICAN: 32 mL/min — AB (ref >60)
GFR, EST NON AFRICAN AMERICAN: 28 mL/min — AB (ref >60)
Glucose, Bld: 80 mg/dL (ref 65–99)
Potassium: 4.4 mmol/L (ref 3.5–5.1)
Sodium: 139 mmol/L (ref 135–145)
TOTAL PROTEIN: 5.5 g/dL — AB (ref 6.5–8.1)

## 2016-02-11 LAB — PROTIME-INR
INR: 2.1 — AB (ref 0.00–1.49)
PROTHROMBIN TIME: 23.4 s — AB (ref 11.6–15.2)

## 2016-02-11 LAB — CBC
HCT: 38.8 % (ref 36.0–46.0)
Hemoglobin: 13.3 g/dL (ref 12.0–15.0)
MCH: 30.8 pg (ref 26.0–34.0)
MCHC: 34.3 g/dL (ref 30.0–36.0)
MCV: 89.8 fL (ref 78.0–100.0)
PLATELETS: 165 10*3/uL (ref 150–400)
RBC: 4.32 MIL/uL (ref 3.87–5.11)
RDW: 16.9 % — ABNORMAL HIGH (ref 11.5–15.5)
WBC: 6.1 10*3/uL (ref 4.0–10.5)

## 2016-02-11 LAB — URINE CULTURE

## 2016-02-11 LAB — TROPONIN I
TROPONIN I: 0.04 ng/mL — AB (ref <0.031)
TROPONIN I: 0.04 ng/mL — AB (ref <0.031)

## 2016-02-11 LAB — MAGNESIUM: Magnesium: 1.6 mg/dL — ABNORMAL LOW (ref 1.7–2.4)

## 2016-02-11 MED ORDER — MORPHINE SULFATE (PF) 2 MG/ML IV SOLN: 2.0000 mg | INTRAVENOUS | Status: DC | PRN

## 2016-02-11 MED ORDER — MORPHINE SULFATE (PF) 4 MG/ML IV SOLN
4.0000 mg | INTRAVENOUS | Status: DC | PRN
  Administered 2016-02-11: 4 mg via INTRAVENOUS
  Filled 2016-02-11: qty 1

## 2016-02-11 MED ORDER — METOPROLOL TARTRATE 5 MG/5ML IV SOLN
5.0000 mg | Freq: Four times a day (QID) | INTRAVENOUS | Status: DC | PRN
  Filled 2016-02-11: qty 5

## 2016-02-11 MED ORDER — SODIUM CHLORIDE 0.9 % IV BOLUS (SEPSIS)
500.0000 mL | Freq: Once | INTRAVENOUS | Status: AC
  Administered 2016-02-11: 500 mL via INTRAVENOUS

## 2016-02-11 MED ORDER — HALOPERIDOL LACTATE 5 MG/ML IJ SOLN: 1.0000 mg | Freq: Four times a day (QID) | INTRAMUSCULAR | Status: DC | PRN

## 2016-02-11 MED ORDER — METOPROLOL TARTRATE 5 MG/5ML IV SOLN
5.0000 mg | INTRAVENOUS | Status: DC | PRN
  Administered 2016-02-11: 5 mg via INTRAVENOUS

## 2016-02-11 MED ORDER — METOPROLOL TARTRATE 5 MG/5ML IV SOLN
5.0000 mg | Freq: Four times a day (QID) | INTRAVENOUS | Status: DC
Start: 2016-02-11 — End: 2016-02-11

## 2016-02-11 NOTE — Progress Notes (Signed)
Patient ID: Rose Weber, female   DOB: 1913-07-09, 82 y.o.   MRN: [id number]     [facility]      [address] [place] [place], [place] [address]    Phone: [phone] FAX: [phone]     Subjective: No n/v. No pain, unless abd is palpated. Wbc remains normal. Afebrile. TB down 6.1.   Objective:  Vital signs:  Filed Vitals:   02/11/16 0400 02/11/16 0446 02/11/16 0500 02/11/16 0600  BP: 110/74  96/42 101/48  Pulse:      Temp:  97.5 F (36.4 C)    TempSrc:  Axillary    Resp: _0 Height:      Weight:      SpO2: 97%  98% 95%       Intake/Output   Yesterday:  06/22 0701 - 06/23 0700 In: 1181.3 [I.V.:881.3; IV Piggyback:300] Out: 0  This shift:   Physical Exam: General: Pt awake/alert/oriented to self.  Abdomen: Soft.  Nondistended. Moderate ttp ruq.  No evidence of peritonitis.  No incarcerated hernias.    Problem List:   Active Problems:   MVP (mitral valve prolapse)   Chronic anticoagulation   Atrial fibrillation (HCC)   Hypothyroidism   CHF (congestive heart failure) (HCC)   Abnormal transaminases   UTI (lower urinary tract infection)   Lactic acidosis    Results:   Labs: Results for orders placed or performed during the hospital encounter of 01/27/2016 (from the past 48 hour(s))  POC occult blood, ED RN will collect     Status: None   Collection Time: 01/21/2016 12:03 PM  Result Value Ref Range   Fecal Occult Bld NEGATIVE NEGATIVE  Comprehensive metabolic panel     Status: Abnormal   Collection Time: 01/29/2016 12:06 PM  Result Value Ref Range   Sodium 138 135 - 145 mmol/L   Potassium 4.7 3.5 - 5.1 mmol/L   Chloride 103 101 - 111 mmol/L   CO2 23 22 - 32 mmol/L   Glucose, Bld 83 65 - 99 mg/dL   BUN 25 (H) 6 - 20 mg/dL   Creatinine, Ser 1.47 (H) 0.44 - 1.00 mg/dL   Calcium 9.3 8.9 - 10.3 mg/dL   Total Protein 7.0 6.5 - 8.1 g/dL   Albumin 3.3 (L) 3.5 - 5.0 g/dL   AST 84 (H) 15 - 41 U/L   ALT 93  (H) 14 - 54 U/L   Alkaline Phosphatase 377 (H) 38 - 126 U/L   Total Bilirubin 8.5 (H) 0.3 - 1.2 mg/dL   GFR calc non Af Amer 28 (L) >60 mL/min   GFR calc Af Amer 32 (L) >60 mL/min    Comment: (NOTE) The eGFR has been calculated using the CKD EPI equation. This calculation has not been validated in all clinical situations. eGFR's persistently <60 mL/min signify possible Chronic Kidney Disease.    Anion gap 12 5 - 15  Lipase, blood     Status: None   Collection Time: 01/25/2016 12:06 PM  Result Value Ref Range   Lipase 16 11 - 51 U/L  Troponin I     Status: Abnormal   Collection Time: 01/21/2016 12:06 PM  Result Value Ref Range   Troponin I 0.05 (H) <0.031 ng/mL    Comment:        PERSISTENTLY INCREASED TROPONIN VALUES IN THE RANGE OF 0.04-0.49 ng/mL CAN BE SEEN IN:       -UNSTABLE ANGINA       -  CONGESTIVE HEART FAILURE       -MYOCARDITIS       -CHEST TRAUMA       -ARRYHTHMIAS       -LATE PRESENTING MYOCARDIAL INFARCTION       -COPD   CLINICAL FOLLOW-UP RECOMMENDED.   CBC with Differential     Status: Abnormal   Collection Time: 02/06/2016 12:06 PM  Result Value Ref Range   WBC 8.8 4.0 - 10.5 K/uL   RBC 5.04 3.87 - 5.11 MIL/uL   Hemoglobin 15.6 (H) 12.0 - 15.0 g/dL   HCT 44.7 36.0 - 46.0 %   MCV 88.7 78.0 - 100.0 fL   MCH 31.0 26.0 - 34.0 pg   MCHC 34.9 30.0 - 36.0 g/dL   RDW 16.6 (H) 11.5 - 15.5 %   Platelets 206 150 - 400 K/uL   Neutrophils Relative % 89 %   Lymphocytes Relative 7 %   Monocytes Relative 4 %   Eosinophils Relative 0 %   Basophils Relative 0 %   Neutro Abs 7.8 (H) 1.7 - 7.7 K/uL   Lymphs Abs 0.6 (L) 0.7 - 4.0 K/uL   Monocytes Absolute 0.4 0.1 - 1.0 K/uL   Eosinophils Absolute 0.0 0.0 - 0.7 K/uL   Basophils Absolute 0.0 0.0 - 0.1 K/uL   WBC Morphology MILD LEFT SHIFT (1-5% METAS, OCC MYELO, OCC BANDS)     Comment: TOXIC GRANULATION VACUOLATED NEUTROPHILS   Protime-INR     Status: Abnormal   Collection Time: 02/13/2016 12:06 PM  Result Value Ref Range    Prothrombin Time 51.7 (H) 11.6 - 15.2 seconds   INR 6.02 (HH) 0.00 - 1.49    Comment: CRITICAL RESULT CALLED TO, READ BACK BY AND VERIFIED WITH: [employer] RN AT 1250 ON 06.22.17 BY SHUEA   I-stat Chem 8, ED     Status: Abnormal   Collection Time: 02/17/2016 12:12 PM  Result Value Ref Range   Sodium 138 135 - 145 mmol/L   Potassium 4.5 3.5 - 5.1 mmol/L   Chloride 104 101 - 111 mmol/L   BUN 23 (H) 6 - 20 mg/dL   Creatinine, Ser 1.50 (H) 0.44 - 1.00 mg/dL   Glucose, Bld 79 65 - 99 mg/dL   Calcium, Ion 1.11 (L) 1.13 - 1.30 mmol/L   TCO2 25 0 - 100 mmol/L   Hemoglobin 17.0 (H) 12.0 - 15.0 g/dL   HCT 50.0 (H) 36.0 - 46.0 %  I-Stat CG4 Lactic Acid, ED     Status: Abnormal   Collection Time: 01/26/2016 12:13 PM  Result Value Ref Range   Lactic Acid, Venous 3.66 (HH) 0.5 - 2.0 mmol/L   Comment NOTIFIED PHYSICIAN   Urinalysis, Routine w reflex microscopic     Status: Abnormal   Collection Time: 02/05/2016  3:20 PM  Result Value Ref Range   Color, Urine AMBER (A) YELLOW    Comment: BIOCHEMICALS MAY BE AFFECTED BY COLOR   APPearance TURBID (A) CLEAR   Specific Gravity, Urine 1.039 (H) 1.005 - 1.030   pH 5.0 5.0 - 8.0   Glucose, UA NEGATIVE NEGATIVE mg/dL   Hgb urine dipstick SMALL (A) NEGATIVE   Bilirubin Urine LARGE (A) NEGATIVE   Ketones, ur NEGATIVE NEGATIVE mg/dL   Protein, ur NEGATIVE NEGATIVE mg/dL   Nitrite POSITIVE (A) NEGATIVE   Leukocytes, UA SMALL (A) NEGATIVE  Urine microscopic-add on     Status: Abnormal   Collection Time: 01/29/2016  3:20 PM  Result Value Ref Range   Squamous Epithelial /  LPF 6-30 (A) NONE SEEN   WBC, UA TOO NUMEROUS TO COUNT 0 - 5 WBC/hpf   RBC / HPF 0-5 0 - 5 RBC/hpf   Bacteria, UA MANY (A) NONE SEEN   Casts HYALINE CASTS (A) NEGATIVE   Crystals CA OXALATE CRYSTALS (A) NEGATIVE  Lactic acid, plasma     Status: Abnormal   Collection Time: 02/03/2016  4:53 PM  Result Value Ref Range   Lactic Acid, Venous 2.2 (HH) 0.5 - 2.0 mmol/L    Comment: CRITICAL RESULT  CALLED TO, READ BACK BY AND VERIFIED WITH: MORGAN,M @ 1738 ON [id number] BY POTEAT,S   Ammonia     Status: None   Collection Time: 02/05/2016  4:53 PM  Result Value Ref Range   Ammonia 21 9 - 35 umol/L  Troponin I (q 6hr x 3)     Status: Abnormal   Collection Time: 01/31/2016  4:53 PM  Result Value Ref Range   Troponin I 0.04 (H) <0.031 ng/mL    Comment:        PERSISTENTLY INCREASED TROPONIN VALUES IN THE RANGE OF 0.04-0.49 ng/mL CAN BE SEEN IN:       -UNSTABLE ANGINA       -CONGESTIVE HEART FAILURE       -MYOCARDITIS       -CHEST TRAUMA       -ARRYHTHMIAS       -LATE PRESENTING MYOCARDIAL INFARCTION       -COPD   CLINICAL FOLLOW-UP RECOMMENDED.   MRSA PCR Screening     Status: None   Collection Time: 02/18/2016  5:46 PM  Result Value Ref Range   MRSA by PCR NEGATIVE NEGATIVE    Comment:        The GeneXpert MRSA Assay (FDA approved for NASAL specimens only), is one component of a comprehensive MRSA colonization surveillance program. It is not intended to diagnose MRSA infection nor to guide or monitor treatment for MRSA infections.   Lactic acid, plasma     Status: Abnormal   Collection Time: 02/07/2016  6:58 PM  Result Value Ref Range   Lactic Acid, Venous 2.2 (HH) 0.5 - 2.0 mmol/L    Comment: CRITICAL RESULT CALLED TO, READ BACK BY AND VERIFIED WITH: BROOKE,K AT 1947 ON 02/08/2016 BY MOSLEY,J   Troponin I (q 6hr x 3)     Status: Abnormal   Collection Time: 02/14/2016 10:06 PM  Result Value Ref Range   Troponin I 0.04 (H) <0.031 ng/mL    Comment:        PERSISTENTLY INCREASED TROPONIN VALUES IN THE RANGE OF 0.04-0.49 ng/mL CAN BE SEEN IN:       -UNSTABLE ANGINA       -CONGESTIVE HEART FAILURE       -MYOCARDITIS       -CHEST TRAUMA       -ARRYHTHMIAS       -LATE PRESENTING MYOCARDIAL INFARCTION       -COPD   CLINICAL FOLLOW-UP RECOMMENDED.   Troponin I (q 6hr x 3)     Status: Abnormal   Collection Time: 02/11/16  4:16 AM  Result Value Ref Range   Troponin I 0.04  (H) <0.031 ng/mL    Comment:        PERSISTENTLY INCREASED TROPONIN VALUES IN THE RANGE OF 0.04-0.49 ng/mL CAN BE SEEN IN:       -UNSTABLE ANGINA       -CONGESTIVE HEART FAILURE       -MYOCARDITIS       -  CHEST TRAUMA       -ARRYHTHMIAS       -LATE PRESENTING MYOCARDIAL INFARCTION       -COPD   CLINICAL FOLLOW-UP RECOMMENDED.   Comprehensive metabolic panel     Status: Abnormal   Collection Time: 02/11/16  4:16 AM  Result Value Ref Range   Sodium 139 135 - 145 mmol/L   Potassium 4.4 3.5 - 5.1 mmol/L   Chloride 111 101 - 111 mmol/L   CO2 20 (L) 22 - 32 mmol/L   Glucose, Bld 80 65 - 99 mg/dL   BUN 27 (H) 6 - 20 mg/dL   Creatinine, Ser 1.48 (H) 0.44 - 1.00 mg/dL   Calcium 8.1 (L) 8.9 - 10.3 mg/dL   Total Protein 5.5 (L) 6.5 - 8.1 g/dL   Albumin 2.5 (L) 3.5 - 5.0 g/dL   AST 40 15 - 41 U/L   ALT 59 (H) 14 - 54 U/L   Alkaline Phosphatase 261 (H) 38 - 126 U/L   Total Bilirubin 6.1 (H) 0.3 - 1.2 mg/dL   GFR calc non Af Amer 28 (L) >60 mL/min   GFR calc Af Amer 32 (L) >60 mL/min    Comment: (NOTE) The eGFR has been calculated using the CKD EPI equation. This calculation has not been validated in all clinical situations. eGFR's persistently <60 mL/min signify possible Chronic Kidney Disease.    Anion gap 8 5 - 15  CBC     Status: Abnormal   Collection Time: 02/11/16  4:16 AM  Result Value Ref Range   WBC 6.1 4.0 - 10.5 K/uL   RBC 4.32 3.87 - 5.11 MIL/uL   Hemoglobin 13.3 12.0 - 15.0 g/dL    Comment: REPEATED TO VERIFY DELTA CHECK NOTED    HCT 38.8 36.0 - 46.0 %   MCV 89.8 78.0 - 100.0 fL   MCH 30.8 26.0 - 34.0 pg   MCHC 34.3 30.0 - 36.0 g/dL   RDW 16.9 (H) 11.5 - 15.5 %   Platelets 165 150 - 400 K/uL    Imaging / Studies: Ct Abdomen Pelvis W Contrast  01/28/2016  CLINICAL DATA:  Diffuse abdominal pain EXAM: CT ABDOMEN AND PELVIS WITH CONTRAST TECHNIQUE: Multidetector CT imaging of the abdomen and pelvis was performed using the standard protocol following bolus  administration of intravenous contrast. CONTRAST:  71m ISOVUE-300 IOPAMIDOL (ISOVUE-300) INJECTION 61% COMPARISON:  None. FINDINGS: Lower chest: Moderate right pleural effusion. Bibasilar atelectasis. Marked cardiac enlargement. Coronary calcification. Hepatobiliary: Marked intrahepatic and extrahepatic biliary dilatation. Common bile duct 17 mm. Large round soft tissue density in the common bile duct compatible with common duct stone. Gallbladder markedly dilated without gallbladder wall thickening. Several small gallstones in the gallbladder. Small liver. Liver contour is irregular. Left lobe liver is enlarged compared to the right. Possible cirrhosis. Pancreas: Atrophic pancreas. Pancreatic duct dilatation. No pancreatic mass or pancreatitis. Spleen: Negative Adrenals/Urinary Tract: Both kidneys normal in size. No obstruction or mass in the kidneys. Stomach/Bowel: Negative for bowel obstruction. No bowel mass or edema. Vascular/Lymphatic: Atherosclerotic calcification in the aorta. No aneurysm. No lymphadenopathy. Reproductive: Hysterectomy.  No pelvic mass. Other: Moderate free fluid in the pelvis. Moderate amount of free fluid around the liver Musculoskeletal: Lumbar dextroscoliosis. Disc degeneration in the lumbar spine most notably L2-3. No fracture. No acute skeletal lesion. IMPRESSION: Marked biliary duct dilatation. Common bile duct 17 mm. Common bile duct stone. Marked gallbladder dilatation without thickening. Cholelithiasis. Probable cirrhosis of liver with moderate ascites. Marked cardiac enlargement with right pleural  effusion Atherosclerotic disease in the aorta and coronary arteries. Electronically Signed   By: Franchot Gallo M.D.   On: 01/31/2016 14:07   Dg Chest Portable 1 View  02/08/2016  CLINICAL DATA:  Abdominal pain, possible free abdominal air EXAM: PORTABLE CHEST 1 VIEW COMPARISON:  01/11/2013 FINDINGS: Cardiomegaly again noted. There is small right pleural effusion with streaky right  basilar atelectasis or infiltrate. Mild left basilar atelectasis. Atherosclerotic calcifications of thoracic aorta. Mild levoscoliosis lower thoracic spine. There is no definite evidence of free abdominal air. Moderate gas noted within proximal stomach. IMPRESSION: There is small right pleural effusion with streaky right basilar atelectasis or infiltrate. Mild left basilar atelectasis. Atherosclerotic calcifications of thoracic aorta. Mild levoscoliosis lower thoracic spine. There is no definite evidence of free abdominal air. Moderate gas within proximal stomach. Electronically Signed   By: Lahoma Crocker M.D.   On: 02/09/2016 12:36    Medications / Allergies:  Scheduled Meds: . aztreonam  500 mg Intravenous Q8H  . levothyroxine  25 mcg Oral Once every other day  . metronidazole  500 mg Intravenous Q8H  . pantoprazole (PROTONIX) IV  40 mg Intravenous Q12H  . polyvinyl alcohol  1 drop Both Eyes QID  . sodium chloride flush  3 mL Intravenous Q12H  . [START ON 16-Feb-2016] vancomycin  500 mg Intravenous Q48H   Continuous Infusions: . sodium chloride 75 mL/hr at 02/11/16 0600   PRN Meds:.fentaNYL (SUBLIMAZE) injection, metoprolol, ondansetron **OR** ondansetron (ZOFRAN) IV, oxyCODONE  Antibiotics: Anti-infectives    Start     Dose/Rate Route Frequency Ordered Stop   02/16/16 1400  vancomycin (VANCOCIN) 500 mg in sodium chloride 0.9 % 100 mL IVPB     500 mg 100 mL/hr over 60 Minutes Intravenous Every 48 hours 02/03/2016 1323     02/09/2016 2200  metroNIDAZOLE (FLAGYL) IVPB 500 mg     500 mg 100 mL/hr over 60 Minutes Intravenous Every 8 hours 02/03/2016 1254     02/11/2016 2100  aztreonam (AZACTAM) 500 mg in dextrose 5 % 50 mL IVPB     500 mg 100 mL/hr over 30 Minutes Intravenous Every 8 hours 01/21/2016 1253     01/21/2016 1400  aztreonam (AZACTAM) 500 mg in dextrose 5 % 50 mL IVPB  Status:  Discontinued     500 mg 100 mL/hr over 30 Minutes Intravenous Every 8 hours 02/02/2016 1246 01/27/2016 1248   01/31/2016  1315  vancomycin (VANCOCIN) IVPB 1000 mg/200 mL premix     1,000 mg 200 mL/hr over 60 Minutes Intravenous STAT 02/11/2016 1306 01/22/2016 1559   02/18/2016 1300  aztreonam (AZACTAM) 500 mg in dextrose 5 % 50 mL IVPB  Status:  Discontinued     500 mg 100 mL/hr over 30 Minutes Intravenous Every 8 hours 01/20/2016 1248 02/05/2016 1253   01/26/2016 1300  aztreonam (AZACTAM) 1 g in dextrose 5 % 50 mL IVPB     1 g 100 mL/hr over 30 Minutes Intravenous STAT 01/27/2016 1252 01/24/2016 1506   02/18/2016 1245  metroNIDAZOLE (FLAGYL) IVPB 500 mg     500 mg 100 mL/hr over 60 Minutes Intravenous  Once 01/23/2016 1230 01/27/2016 1506        Assessment/Plan Choledocholithiasis Cholelithiasis Pt to undergo ERCP once INR is down.  Agree with antibiotics, but aztreonam and flagyl should be sufficient, will leave up to primary team.  No surgical plans.  Will continue to follow.  CV-CHF, Afib on coumadin, severe MVP,      Wendle Kina, ANP-BC [facility] Pager [phone]([id number]:30P)   02/11/2016 7:31 AM

## 2016-02-11 NOTE — Clinical Social Work Note (Signed)
CSW consult received.  CSW contacted staff at [facility] who confirmed that pt has been a resident in their ALF since May 2015.  The facility RN explained that pt has been receiving limited assistance and during the past couple of weeks she began to require more assistance.  CSW will continue to follow and assist with d/c planning needs.  [place], [place]

## 2016-02-11 NOTE — Progress Notes (Signed)
Notified MD of decrease UO throughout day. Order received to place foley catheter. Also discussed pain interventions with MD. New med orders given. Will continue to monitor.

## 2016-02-11 NOTE — Progress Notes (Signed)
[facility] PROGRESS NOTE    Progress Note  Rose Weber  [id number] DOB: Mar 14, 1913 DOA: 01/21/2016 PCP: Estill Dooms, MD     Brief Narrative:   Rose Weber is an 82 y.o. female 's medical history of A. fib on Coumadin by Dr. Jabier Mutton prolapse chronic diastolic heart failure who comes in with altered mental status and abdominal pain was found to have choledocholithiasis.  Assessment/Plan:   Choledocholithiasis with obstruction/Cholelithiasis with obstruction: - Appreciate surgeries and Gi assistance assistance, will continue IV antibiotics and keep the patient nothing by mouth. - Will probably need an ERCP once her gallbladder cools down and INR improved. She received vitamin K in the ED PT and INR is pending  Lactic acidosis: Questionable due to dehydration she is afebrile with no fevers, and agree with empiric antibiotic, blood cultures are pending. She was hemoconcentrated when she came in her hemoglobin has dropped to 13 likely due to decreased intravascular volume.  Supratherapeutic INR: Coumadin has been DC'd she received vitamin K in anticipation of ERCP.  Probable UTI: [place]o, white blood cell she was started empirically on antibiotics awaiting urine cultures.  Atrial fibrillation with RVR: Rate controlled continue to hold Coumadin INR is pending.  Mild Elevated troponins: In the setting of acute eat illness, she denies chest pain or shortness of breath they have stayed flat unlikely ischemic event.  Hypothyroidism: Continue Synthroid.    High risk for acute confusional state: Will be moderate with narcotic use use Haldol for agitation, QTC and 12-lead EKG 450. She does have a diagnosis of prolonged QT produces her average looking through the chart she has run as high as 475. Potassium is 4.4 we'll check a magnesium level. Repeated 12-lead EKG in the morning.  DVT prophylaxis: INR supratherapeutic Family Communication:none Disposition Plan/Barrier  to D/C: unable to determine Code Status:     Code Status Orders        Start     Ordered   02/07/2016 1806  Do not attempt resuscitation (DNR)   Continuous    Question Answer Comment  In the event of cardiac or respiratory ARREST Do not call a "code blue"   In the event of cardiac or respiratory ARREST Do not perform Intubation, CPR, defibrillation or ACLS   In the event of cardiac or respiratory ARREST Use medication by any route, position, wound care, and other measures to relive pain and suffering. May use oxygen, suction and manual treatment of airway obstruction as needed for comfort.      01/27/2016 1805    Code Status History    Date Active Date Inactive Code Status Order ID Comments User Context   02/26/2014  3:16 PM 02/13/2016  6:05 PM DNR [id number]  Ripley Fraise, [facility]   01/10/2013  4:21 PM 01/15/2013  6:17 PM DNR [id number]  Louellen Molder, MD Inpatient   01/10/2013 12:33 PM 01/10/2013  4:21 PM DNR [id number]  Carmin Muskrat, MD ED    Advance Directive Documentation        Most Recent Value   Type of Advance Directive  Healthcare Power of [place], Out of facility DNR (pink MOST or yellow form)   Pre-existing out of facility DNR order (yellow form or pink MOST form)  Yellow form placed in chart (order not valid for inpatient use)   "MOST" Form in Place?          IV Access:    Peripheral IV   Procedures and diagnostic studies:   Ct  Abdomen Pelvis W Contrast  01/25/2016  CLINICAL DATA:  Diffuse abdominal pain EXAM: CT ABDOMEN AND PELVIS WITH CONTRAST TECHNIQUE: Multidetector CT imaging of the abdomen and pelvis was performed using the standard protocol following bolus administration of intravenous contrast. CONTRAST:  71mL ISOVUE-300 IOPAMIDOL (ISOVUE-300) INJECTION 61% COMPARISON:  None. FINDINGS: Lower chest: Moderate right pleural effusion. Bibasilar atelectasis. Marked cardiac enlargement. Coronary calcification. Hepatobiliary: Marked intrahepatic and  extrahepatic biliary dilatation. Common bile duct 17 mm. Large round soft tissue density in the common bile duct compatible with common duct stone. Gallbladder markedly dilated without gallbladder wall thickening. Several small gallstones in the gallbladder. Small liver. Liver contour is irregular. Left lobe liver is enlarged compared to the right. Possible cirrhosis. Pancreas: Atrophic pancreas. Pancreatic duct dilatation. No pancreatic mass or pancreatitis. Spleen: Negative Adrenals/Urinary Tract: Both kidneys normal in size. No obstruction or mass in the kidneys. Stomach/Bowel: Negative for bowel obstruction. No bowel mass or edema. Vascular/Lymphatic: Atherosclerotic calcification in the aorta. No aneurysm. No lymphadenopathy. Reproductive: Hysterectomy.  No pelvic mass. Other: Moderate free fluid in the pelvis. Moderate amount of free fluid around the liver Musculoskeletal: Lumbar dextroscoliosis. Disc degeneration in the lumbar spine most notably L2-3. No fracture. No acute skeletal lesion. IMPRESSION: Marked biliary duct dilatation. Common bile duct 17 mm. Common bile duct stone. Marked gallbladder dilatation without thickening. Cholelithiasis. Probable cirrhosis of liver with moderate ascites. Marked cardiac enlargement with right pleural effusion Atherosclerotic disease in the aorta and coronary arteries. Electronically Signed   By: Franchot Gallo M.D.   On: 02/05/2016 14:07   Dg Chest Portable 1 View  02/16/2016  CLINICAL DATA:  Abdominal pain, possible free abdominal air EXAM: PORTABLE CHEST 1 VIEW COMPARISON:  01/11/2013 FINDINGS: Cardiomegaly again noted. There is small right pleural effusion with streaky right basilar atelectasis or infiltrate. Mild left basilar atelectasis. Atherosclerotic calcifications of thoracic aorta. Mild levoscoliosis lower thoracic spine. There is no definite evidence of free abdominal air. Moderate gas noted within proximal stomach. IMPRESSION: There is small right pleural  effusion with streaky right basilar atelectasis or infiltrate. Mild left basilar atelectasis. Atherosclerotic calcifications of thoracic aorta. Mild levoscoliosis lower thoracic spine. There is no definite evidence of free abdominal air. Moderate gas within proximal stomach. Electronically Signed   By: Lahoma Crocker M.D.   On: 02/15/2016 12:36     Medical Consultants:    None.  Anti-Infectives:   Vancomycin and aztreonam  Subjective:    [place] Vicencio nonverbal, moaning and groaning.  Objective:    Filed Vitals:   02/11/16 0446 02/11/16 0500 02/11/16 0600 02/11/16 0700  BP:  96/42 101/48 86/38  Pulse:      Temp: 97.5 F (36.4 C)     TempSrc: Axillary     Resp:  15 17 16   Height:      Weight:      SpO2:  98% 95% 99%    Intake/Output Summary (Last 24 hours) at 02/11/16 0758 Last data filed at 02/11/16 0653  Gross per 24 hour  Intake 1181.25 ml  Output      0 ml  Net 1181.25 ml   Filed Weights   02/17/2016 1724  Weight: 52.4 kg (115 lb 8.3 oz)    Exam: General exam: In no acute distress. Respiratory system: Good air movement and clear to auscultation. Cardiovascular system: S1 & S2 heard, RRR. No JVD, murmurs, rubs, gallops or clicks.  Gastrointestinal system: Abdomen is nondistended, soft and Implantable quadrant tenderness.  Central nervous system:  No focal neurological  deficits. Extremities: No pedal edema. Skin: No rashes, lesions or ulcers Psychiatry: Confused   Data Reviewed:    Labs: Basic Metabolic Panel:  Recent Labs Lab 01/26/2016 1206 01/22/2016 1212 02/11/16 0416  NA 138 138 139  K 4.7 4.5 4.4  CL 103 104 111  CO2 23  --  20*  GLUCOSE 83 79 80  BUN 25* 23* 27*  CREATININE 1.47* 1.50* 1.48*  CALCIUM 9.3  --  8.1*   GFR Estimated Creatinine Clearance: 16.3 mL/min (by C-G formula based on Cr of 1.48). Liver Function Tests:  Recent Labs Lab 02/11/2016 1206 02/11/16 0416  AST 84* 40  ALT 93* 59*  ALKPHOS 377* 261*  BILITOT 8.5* 6.1*    PROT 7.0 5.5*  ALBUMIN 3.3* 2.5*    Recent Labs Lab 02/17/2016 1206  LIPASE 16    Recent Labs Lab 02/14/2016 1653  AMMONIA 21   Coagulation profile  Recent Labs Lab 01/27/2016 1206  INR 6.02*    CBC:  Recent Labs Lab 01/23/2016 1206 02/09/2016 1212 02/11/16 0416  WBC 8.8  --  6.1  NEUTROABS 7.8*  --   --   HGB 15.6* 17.0* 13.3  HCT 44.7 50.0* 38.8  MCV 88.7  --  89.8  PLT 206  --  165   Cardiac Enzymes:  Recent Labs Lab 02/08/2016 1206 01/29/2016 1653 01/31/2016 2206 02/11/16 0416  TROPONINI 0.05* 0.04* 0.04* 0.04*   BNP (last 3 results) No results for input(s): PROBNP in the last 8760 hours. CBG: No results for input(s): GLUCAP in the last 168 hours. D-Dimer: No results for input(s): DDIMER in the last 72 hours. Hgb A1c: No results for input(s): HGBA1C in the last 72 hours. Lipid Profile: No results for input(s): CHOL, HDL, LDLCALC, TRIG, CHOLHDL, LDLDIRECT in the last 72 hours. Thyroid function studies: No results for input(s): TSH, T4TOTAL, T3FREE, THYROIDAB in the last 72 hours.  Invalid input(s): FREET3 Anemia work up: No results for input(s): VITAMINB12, FOLATE, FERRITIN, TIBC, IRON, RETICCTPCT in the last 72 hours. Sepsis Labs:  Recent Labs Lab 02/01/2016 1206 02/06/2016 1213 02/17/2016 1653 01/29/2016 1858 02/11/16 0416  WBC 8.8  --   --   --  6.1  LATICACIDVEN  --  3.66* 2.2* 2.2*  --    Microbiology Recent Results (from the past 240 hour(s))  MRSA PCR Screening     Status: None   Collection Time: 02/06/2016  5:46 PM  Result Value Ref Range Status   MRSA by PCR NEGATIVE NEGATIVE Final    Comment:        The GeneXpert MRSA Assay (FDA approved for NASAL specimens only), is one component of a comprehensive MRSA colonization surveillance program. It is not intended to diagnose MRSA infection nor to guide or monitor treatment for MRSA infections.      Medications:   . aztreonam  500 mg Intravenous Q8H  . levothyroxine  25 mcg Oral Once  every other day  . metronidazole  500 mg Intravenous Q8H  . pantoprazole (PROTONIX) IV  40 mg Intravenous Q12H  . polyvinyl alcohol  1 drop Both Eyes QID  . sodium chloride flush  3 mL Intravenous Q12H  . [START ON 2016/03/12] vancomycin  500 mg Intravenous Q48H   Continuous Infusions: . sodium chloride 75 mL/hr at 02/11/16 0600    Time spent: 25 min   LOS: 1 day   Charlynne Cousins  [facility] Pager [phone]  *Please refer to [email or web address], password [email or web address] to get updated schedule on who  will round on this patient, as hospitalists switch teams weekly. If 7PM-7AM, please contact night-coverage at [email or web address], password [email or web address] for any overnight needs.  02/11/2016, 7:58 AM

## 2016-02-11 NOTE — Progress Notes (Signed)
Pt's BP continues to drop. Systolic in the 0000000. Map in the 40's. MD notified. 500CC NS bolus ordered.  Will continue to monitor.

## 2016-02-11 NOTE — Progress Notes (Signed)
Patient HR low 150's. MD notified and IV lopressor ordered. Will continue to monitor.

## 2016-02-11 NOTE — Progress Notes (Signed)
Spoke for about 20 minutes on the telephone with the patient's son. He is on his way here from [place], [place], and should be here by early afternoon.  He sounds very reasonable, and indicates that he had previously had discussions with his mother, to the effect that she has "had a good life" and does not want any special interventions done. He reconfirmed her DO NOT RESUSCITATE status.  I recommended, and he is agreeable, that at least for now we continue medical supportive care, including antibiotics. Further decisions would then depend on the patient's clinical evolution. If she fails to improve despite antibiotic therapy, consideration might be given to pulling back just to comfort measures only. On the other hand, if she will rallies and becomes quite stable, consideration might be given to doing a semi-elective ERCP for the purpose of correcting the known problem she has with her common duct stone.  For that reason, we will plan to follow the patient with you for the time being. Please call us at any time if you would like to discuss her case in more detail.  Cleotis Nipper, M.D. Pager [phone] If no answer or after 5 PM call [phone]

## 2016-02-11 NOTE — Progress Notes (Signed)
Patient BP dropped to systolic in [id number]. MD notified. No new orders given. Will continue to monitor.

## 2016-02-11 NOTE — Progress Notes (Signed)
GASTROENTEROLOGY PROGRESS NOTE  Problem:   Choledocholithiasis with probable biliary sepsis  Subjective: Patient somnolent in bed, sort of moaning. Does not answer questions, but is arousable. Nonverbal.  Objective: Simply touching the patient anywhere causes her to say "owwww." This seems to be especially true, however, touching the abdomen. When I gently placed my stethoscope on her abdominal wall to listen for bowel sounds, she pulled her arm away and moaned more loudly. This was without any palpation. Attempts to palpate the abdomen were met with the patient pulling my arm away. Accordingly, it is not possible to get a good abdominal assessment, but she does seem to be diffusely tender in the abdomen. The severity and location of the tenderness, however, cannot be assessed because of the patient's reactivity as described above.  She is afebrile and in fact slightly hypothermic, tachycardic, and is running low blood pressures in the 22-979 range, systolic.  The skin is fairly well perfused, no cyanosis, no frank jaundice despite her high bilirubin.  Labs are pertinent for mild improvement in her bilirubin overnight, dropping from 8.5 to 6.1. Transaminases have dropped about 50% and are now almost down to normal. Alkaline phosphatase has similarly dropped about 30%. Some of this improvement, however, could be dilutional in that there has been a corresponding drop in her serum albumin level.  INR has come down nicely on vitamin K, from 6.0 to a current level of 2.1.  White count remains normal.  Blood cultures are still pending.   Assessment: Choledocholithiasis with radiographic and biochemical evidence of acute on chronic biliary obstruction, probably with some element of cholangitis and biliary sepsis.  Plan: At this point, absent a clear directive from any family member or responsible party to do aggressive intervention which would be extremely high risk given her current status, it  seems like the most appropriate course of action in this very elderly, chronically ill individual is to continue medical supportive care including antibiotic therapy, while waiting for further improvement in her coagulation status.  Dr. Paulita Fujita will be rounding for [place] tomorrow and can reassess the patient's status at that time. In the meantime, I will leave an order requesting to be contacted if any family members come in or call in.   I called the listed Emergency Contact Marcos Eke) who gave me the patient's son's Dustee Bottenfield) numbers (h [phone], c [phone] with his wife at home, unable to reach husband at this time who is on the way down here (cell phone went into voicemail). Will try again later.   Cleotis Nipper, M.D. 02/11/2016 9:02 AM  Pager [phone] If no answer or after 5 PM call [phone]

## 2016-02-12 DIAGNOSIS — E43 Unspecified severe protein-calorie malnutrition: Secondary | ICD-10-CM

## 2016-02-12 LAB — PROTIME-INR
INR: 1.7 — ABNORMAL HIGH (ref 0.00–1.49)
PROTHROMBIN TIME: 20 s — AB (ref 11.6–15.2)

## 2016-02-12 MED ORDER — MORPHINE SULFATE 25 MG/ML IV SOLN
1.0000 mg/h | INTRAVENOUS | Status: DC
  Filled 2016-02-12: qty 10

## 2016-02-15 LAB — CULTURE, BLOOD (ROUTINE X 2)
CULTURE: NO GROWTH
Culture: NO GROWTH

## 2016-02-19 NOTE — Progress Notes (Signed)
Pt expired at [place].  Strip printed.  This nurse as well as Mady Haagensen listened for 1 minute and was unable to hear heart sounds.  Pt son Ed made aware and arrived shortly after. [facility] referrel number [phone] and spoke with [facility].  Dr. Aileen Fass informed.  Irven Baltimore, RN

## 2016-02-19 NOTE — Discharge Summary (Addendum)
Death Summary  Rose Weber [id number] DOB: 1913-08-10 DOA: Mar 07, 2016  PCP: Jeanmarie Hubert, MD PCP/Office notified: No  Admit date: 2016-03-07 Date of Death: 04/03/2016  Final Diagnoses:  Principal Problem:   Choledocholithiasis with obstruction Active Problems:   MVP (mitral valve prolapse)   Chronic anticoagulation   Atrial fibrillation with RVR (HCC)   Hypothyroidism   CHF (congestive heart failure) (HCC)   UTI (lower urinary tract infection)   Lactic acidosis   Cholelithiasis with obstruction   Protein-calorie malnutrition, severe (HCC)     History of present illness:  82 year old female with past medical history of A. fib on Coumadin, hypertension and mitral valve prolapse, chronic diastolic heart failure came in on the day of admission with abdominal pain and altered mental status.  Hospital Course:  Choledocholithiasis with obstruction/Cholelithiasis with obstruction: - Surgeries and Gi assistance were consulted, IV antibiotics was started and patient nothing by mouth. Her INR was partially reversed for possible surgical intervention - GI discussed with the son and he did not order any aggressive treatment like ERCP or surgical intervention, we agreed to continue conservative management due to her age and multiple comorbidities.  Patient developed A. fib overnight on 02/11/2016 became hypotensive, she was continued on IV morphine, it was discussed with [place] on her program prognosis and he agreed to move towards comfort care. She was started on a morphine drip and she passed away a few hours later.   Lactic acidosis: Supratherapeutic INR: Probable UTI: Atrial fibrillation with RVR: Mild Elevated troponins: Hypothyroidism: High risk for acute confusional state:  Time: 15 min  Signed:  Charlynne Cousins  [facility] Apr 03, 2016, 2:26 PM

## 2016-02-19 NOTE — Progress Notes (Signed)
Hanley Seamen Terri from pharmacy unspiked bag of morphine to take back to pharmacy.  Irven Baltimore, RN

## 2016-02-19 NOTE — Progress Notes (Signed)
Pt's emergency contact notified of low blood pressures.  Son's (Ed) contact info - not found in chart. Emergency contact gave son's phone number for future reference. Son contacted and updated.  Son Jaquita Rector H: [phone] C: [phone]

## 2016-02-19 NOTE — Progress Notes (Signed)
[facility] PROGRESS NOTE    Progress Note  Rose Weber  ZTI:[id number] DOB: October 28, 1912 DOA: 01/21/2016 PCP: Estill Dooms, MD     Brief Narrative:   Rose Weber is an 82 y.o. female 's medical history of A. fib on Coumadin by Dr. Jabier Mutton prolapse chronic diastolic heart failure who comes in with altered mental status and abdominal pain was found to have choledocholithiasis.  Assessment/Plan:   Choledocholithiasis with obstruction/Cholelithiasis with obstruction: - Appreciate surgeries and Gi assistance assistance. - Patient remained hypotensive overnight and in A. fib with RVR hard to controlled, she started developing hypotension which is not improved with IV fluid hydration. It seems she is progressing not responding to medical management this was discussed with the son. - Met with son this morning discuss poor prognosis he agreed to move towards comfort care. - He wants all antibiotics stops and no further lab draws. - Go ahead and start her on a morphine drip and over to regular bed. - Spent more than 35 minutes speaking to the son about her condition and her progression.  Lactic acidosis: Supratherapeutic INR: Probable UTI: Atrial fibrillation with RVR: Mild Elevated troponins: Hypothyroidism Severe protein caloric malnutrition  DVT prophylaxis: INR supratherapeutic Family Communication:none Disposition Plan/Barrier to D/C: unable to determine Code Status:     Code Status Orders        Start     Ordered   02/04/2016 1806  Do not attempt resuscitation (DNR)   Continuous    Question Answer Comment  In the event of cardiac or respiratory ARREST Do not call a "code blue"   In the event of cardiac or respiratory ARREST Do not perform Intubation, CPR, defibrillation or ACLS   In the event of cardiac or respiratory ARREST Use medication by any route, position, wound care, and other measures to relive pain and suffering. May use oxygen, suction and manual  treatment of airway obstruction as needed for comfort.      02/06/2016 1805    Code Status History    Date Active Date Inactive Code Status Order ID Comments User Context   02/26/2014  3:16 PM 01/21/2016  6:05 PM DNR [id number]  Ripley Fraise, [place] [facility]   01/10/2013  4:21 PM 01/15/2013  6:17 PM DNR [id number]  Louellen Molder, MD Inpatient   01/10/2013 12:33 PM 01/10/2013  4:21 PM DNR [id number]  Carmin Muskrat, MD ED    Advance Directive Documentation        Most Recent Value   Type of Advance Directive  Healthcare Power of [place], Out of facility DNR (pink MOST or yellow form)   Pre-existing out of facility DNR order (yellow form or pink MOST form)  Yellow form placed in chart (order not valid for inpatient use)   "MOST" Form in Place?          IV Access:    Peripheral IV   Procedures and diagnostic studies:   Ct Abdomen Pelvis W Contrast  02/16/2016  CLINICAL DATA:  Diffuse abdominal pain EXAM: CT ABDOMEN AND PELVIS WITH CONTRAST TECHNIQUE: Multidetector CT imaging of the abdomen and pelvis was performed using the standard protocol following bolus administration of intravenous contrast. CONTRAST:  80m ISOVUE-300 IOPAMIDOL (ISOVUE-300) INJECTION 61% COMPARISON:  None. FINDINGS: Lower chest: Moderate right pleural effusion. Bibasilar atelectasis. Marked cardiac enlargement. Coronary calcification. Hepatobiliary: Marked intrahepatic and extrahepatic biliary dilatation. Common bile duct 17 mm. Large round soft tissue density in the common bile duct compatible with common duct stone. Gallbladder  markedly dilated without gallbladder wall thickening. Several small gallstones in the gallbladder. Small liver. Liver contour is irregular. Left lobe liver is enlarged compared to the right. Possible cirrhosis. Pancreas: Atrophic pancreas. Pancreatic duct dilatation. No pancreatic mass or pancreatitis. Spleen: Negative Adrenals/Urinary Tract: Both kidneys normal in size. No obstruction or mass in the  kidneys. Stomach/Bowel: Negative for bowel obstruction. No bowel mass or edema. Vascular/Lymphatic: Atherosclerotic calcification in the aorta. No aneurysm. No lymphadenopathy. Reproductive: Hysterectomy.  No pelvic mass. Other: Moderate free fluid in the pelvis. Moderate amount of free fluid around the liver Musculoskeletal: Lumbar dextroscoliosis. Disc degeneration in the lumbar spine most notably L2-3. No fracture. No acute skeletal lesion. IMPRESSION: Marked biliary duct dilatation. Common bile duct 17 mm. Common bile duct stone. Marked gallbladder dilatation without thickening. Cholelithiasis. Probable cirrhosis of liver with moderate ascites. Marked cardiac enlargement with right pleural effusion Atherosclerotic disease in the aorta and coronary arteries. Electronically Signed   By: Franchot Gallo M.D.   On: 02/07/2016 14:07   Dg Chest Portable 1 View  01/21/2016  CLINICAL DATA:  Abdominal pain, possible free abdominal air EXAM: PORTABLE CHEST 1 VIEW COMPARISON:  01/11/2013 FINDINGS: Cardiomegaly again noted. There is small right pleural effusion with streaky right basilar atelectasis or infiltrate. Mild left basilar atelectasis. Atherosclerotic calcifications of thoracic aorta. Mild levoscoliosis lower thoracic spine. There is no definite evidence of free abdominal air. Moderate gas noted within proximal stomach. IMPRESSION: There is small right pleural effusion with streaky right basilar atelectasis or infiltrate. Mild left basilar atelectasis. Atherosclerotic calcifications of thoracic aorta. Mild levoscoliosis lower thoracic spine. There is no definite evidence of free abdominal air. Moderate gas within proximal stomach. Electronically Signed   By: Lahoma Crocker M.D.   On: 01/23/2016 12:36     Medical Consultants:    None.  Anti-Infectives:   Vancomycin and aztreonam stopped on 06-Mar-2016  Subjective:    Rose Weber nonverbal, moaning and groaning.  Objective:    Filed Vitals:    06-Mar-2016 0000 06-Mar-2016 0100 March 06, 2016 0200 03-06-2016 0500  BP: 60/38 80/57 64/38    Pulse:      Temp:      TempSrc:      Resp: 17 17 17    Height:      Weight:    52.4 kg (115 lb 8.3 oz)  SpO2: 97% 97% 96%     Intake/Output Summary (Last 24 hours) at Mar 06, 2016 0735 Last data filed at 03-06-16 0700  Gross per 24 hour  Intake    940 ml  Output    100 ml  Net    840 ml   Filed Weights   02/02/2016 1724 Mar 06, 2016 0500  Weight: 52.4 kg (115 lb 8.3 oz) 52.4 kg (115 lb 8.3 oz)    Exam: General exam: In  acute distress.Cachectic appearing Respiratory system: Good air movement and clear to auscultation. Cardiovascular system: S1 & S2 heard, RRR.  Gastrointestinal system: Abdomen is nondistended, soft and Implantable quadrant tenderness.  Central nervous system:  No focal neurological deficits. Extremities: No pedal edema. Skin: No rashes, lesions or ulcers Psychiatry: Confused   Data Reviewed:    Labs: Basic Metabolic Panel:  Recent Labs Lab 02/07/2016 1206 02/06/2016 1212 02/11/16 0416 02/11/16 0825  NA 138 138 139  --   K 4.7 4.5 4.4  --   CL 103 104 111  --   CO2 23  --  20*  --   GLUCOSE 83 79 80  --   BUN 25* 23* 27*  --  CREATININE 1.47* 1.50* 1.48*  --   CALCIUM 9.3  --  8.1*  --   MG  --   --   --  1.6*   GFR Estimated Creatinine Clearance: 16.3 mL/min (by C-G formula based on Cr of 1.48). Liver Function Tests:  Recent Labs Lab 01/30/2016 1206 02/11/16 0416  AST 84* 40  ALT 93* 59*  ALKPHOS 377* 261*  BILITOT 8.5* 6.1*  PROT 7.0 5.5*  ALBUMIN 3.3* 2.5*    Recent Labs Lab 02/04/2016 1206  LIPASE 16    Recent Labs Lab 02/15/2016 1653  AMMONIA 21   Coagulation profile  Recent Labs Lab 02/03/2016 1206 02/11/16 0825 02/23/16 0325  INR 6.02* 2.10* 1.70*    CBC:  Recent Labs Lab 01/29/2016 1206 02/01/2016 1212 02/11/16 0416  WBC 8.8  --  6.1  NEUTROABS 7.8*  --   --   HGB 15.6* 17.0* 13.3  HCT 44.7 50.0* 38.8  MCV 88.7  --  89.8  PLT 206  --   165   Cardiac Enzymes:  Recent Labs Lab 01/27/2016 1206 01/26/2016 1653 01/27/2016 2206 02/11/16 0416  TROPONINI 0.05* 0.04* 0.04* 0.04*   BNP (last 3 results) No results for input(s): PROBNP in the last 8760 hours. CBG: No results for input(s): GLUCAP in the last 168 hours. D-Dimer: No results for input(s): DDIMER in the last 72 hours. Hgb A1c: No results for input(s): HGBA1C in the last 72 hours. Lipid Profile: No results for input(s): CHOL, HDL, LDLCALC, TRIG, CHOLHDL, LDLDIRECT in the last 72 hours. Thyroid function studies: No results for input(s): TSH, T4TOTAL, T3FREE, THYROIDAB in the last 72 hours.  Invalid input(s): FREET3 Anemia work up: No results for input(s): VITAMINB12, FOLATE, FERRITIN, TIBC, IRON, RETICCTPCT in the last 72 hours. Sepsis Labs:  Recent Labs Lab 02/11/2016 1206 01/31/2016 1213 01/23/2016 1653 01/25/2016 1858 02/11/16 0416  WBC 8.8  --   --   --  6.1  LATICACIDVEN  --  3.66* 2.2* 2.2*  --    Microbiology Recent Results (from the past 240 hour(s))  Blood Culture (routine x 2)     Status: None (Preliminary result)   Collection Time: 01/27/2016 12:20 PM  Result Value Ref Range Status   Specimen Description BLOOD LEFT FOREARM  Final   Special Requests IN PEDIATRIC BOTTLE 1ML  Final   Culture   Final    NO GROWTH 1 DAY Performed at [facility]    Report Status PENDING  Incomplete  Blood Culture (routine x 2)     Status: None (Preliminary result)   Collection Time: 02/02/2016  1:09 PM  Result Value Ref Range Status   Specimen Description BLOOD LEFT ANTECUBITAL  Final   Special Requests BOTTLES DRAWN AEROBIC AND ANAEROBIC 5CC EACH  Final   Culture   Final    NO GROWTH 1 DAY Performed at [facility]    Report Status PENDING  Incomplete  Urine culture     Status: Abnormal   Collection Time: 02/08/2016  3:21 PM  Result Value Ref Range Status   Specimen Description URINE, CATHETERIZED  Final   Special Requests NONE  Final   Culture  MULTIPLE SPECIES PRESENT, SUGGEST RECOLLECTION (A)  Final   Report Status 02/11/2016 FINAL  Final  MRSA PCR Screening     Status: None   Collection Time: 02/08/2016  5:46 PM  Result Value Ref Range Status   MRSA by PCR NEGATIVE NEGATIVE Final    Comment:  The GeneXpert MRSA Assay (FDA approved for NASAL specimens only), is one component of a comprehensive MRSA colonization surveillance program. It is not intended to diagnose MRSA infection nor to guide or monitor treatment for MRSA infections.      Medications:   . aztreonam  500 mg Intravenous Q8H  . levothyroxine  25 mcg Oral Once every other day  . metronidazole  500 mg Intravenous Q8H  . pantoprazole (PROTONIX) IV  40 mg Intravenous Q12H  . polyvinyl alcohol  1 drop Both Eyes QID  . sodium chloride flush  3 mL Intravenous Q12H  . vancomycin  500 mg Intravenous Q48H   Continuous Infusions: . sodium chloride 75 mL/hr at 02/11/16 0600    Time spent: 35 min   LOS: 2 days   Charlynne Cousins  [facility] Pager [phone]  *Please refer to [email or web address], password [email or web address] to get updated schedule on who will round on this patient, as hospitalists switch teams weekly. If 7PM-7AM, please contact night-coverage at [email or web address], password [email or web address] for any overnight needs.  Mar 04, 2016, 7:35 AM

## 2016-02-19 DEATH — deceased

## 2016-03-08 DIAGNOSIS — N179 Acute kidney failure, unspecified: Secondary | ICD-10-CM

## 2016-06-01 ENCOUNTER — Encounter: Payer: Self-pay | Admitting: Internal Medicine
